# Patient Record
Sex: Female | Born: 1990 | Race: White | Hispanic: No | Marital: Single | Smoking: Never smoker
Health system: Southern US, Community
[De-identification: ages and names within clinical notes are randomized; demographics above are authoritative.]

## PROBLEM LIST (undated history)

## (undated) DIAGNOSIS — I10 Essential (primary) hypertension: Secondary | ICD-10-CM

## (undated) DIAGNOSIS — E669 Obesity, unspecified: Secondary | ICD-10-CM

## (undated) DIAGNOSIS — D649 Anemia, unspecified: Secondary | ICD-10-CM

## (undated) DIAGNOSIS — Z789 Other specified health status: Secondary | ICD-10-CM

## (undated) DIAGNOSIS — O139 Gestational [pregnancy-induced] hypertension without significant proteinuria, unspecified trimester: Secondary | ICD-10-CM

## (undated) DIAGNOSIS — R112 Nausea with vomiting, unspecified: Secondary | ICD-10-CM

## (undated) DIAGNOSIS — O09299 Supervision of pregnancy with other poor reproductive or obstetric history, unspecified trimester: Secondary | ICD-10-CM

## (undated) DIAGNOSIS — F32A Depression, unspecified: Secondary | ICD-10-CM

## (undated) DIAGNOSIS — Z9889 Other specified postprocedural states: Secondary | ICD-10-CM

## (undated) HISTORY — DX: Gestational (pregnancy-induced) hypertension without significant proteinuria, unspecified trimester: O13.9

## (undated) HISTORY — DX: Depression, unspecified: F32.A

## (undated) HISTORY — DX: Anemia, unspecified: D64.9

## (undated) HISTORY — DX: Other specified health status: Z78.9

---
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
## 2008-03-06 ENCOUNTER — Emergency Department (HOSPITAL_COMMUNITY): Admission: EM | Admit: 2008-03-06 | Discharge: 2008-03-06 | Payer: Self-pay | Admitting: Emergency Medicine

## 2008-04-19 ENCOUNTER — Ambulatory Visit: Payer: Self-pay | Admitting: Family Medicine

## 2008-04-19 LAB — CONVERTED CEMR LAB
BUN: 11 mg/dL (ref 6–23)
Basophils Absolute: 0 10*3/uL (ref 0.0–0.1)
Basophils Relative: 0 % (ref 0–1)
CO2: 25 meq/L (ref 19–32)
Chloride: 103 meq/L (ref 96–112)
Creatinine, Ser: 0.65 mg/dL (ref 0.40–1.20)
Eosinophils Relative: 5 % (ref 0–5)
Glucose, Bld: 84 mg/dL (ref 70–99)
Lymphocytes Relative: 28 % (ref 12–46)
Neutrophils Relative %: 58 % (ref 43–77)
Potassium: 3.9 meq/L (ref 3.5–5.3)
RBC: 4.9 M/uL (ref 3.87–5.11)
Sodium: 138 meq/L (ref 135–145)
TSH: 2.555 microintl units/mL (ref 0.350–5.50)
Total Bilirubin: 0.3 mg/dL (ref 0.3–1.2)

## 2008-04-22 ENCOUNTER — Ambulatory Visit (HOSPITAL_COMMUNITY): Admission: RE | Admit: 2008-04-22 | Discharge: 2008-04-22 | Payer: Self-pay | Admitting: Family Medicine

## 2008-06-09 ENCOUNTER — Ambulatory Visit: Payer: Self-pay | Admitting: Internal Medicine

## 2008-07-19 ENCOUNTER — Ambulatory Visit: Payer: Self-pay | Admitting: Internal Medicine

## 2008-07-27 ENCOUNTER — Encounter (INDEPENDENT_AMBULATORY_CARE_PROVIDER_SITE_OTHER): Payer: Self-pay | Admitting: Family Medicine

## 2008-07-27 ENCOUNTER — Ambulatory Visit: Payer: Self-pay | Admitting: Internal Medicine

## 2008-07-27 LAB — CONVERTED CEMR LAB: TSH: 1.469 microintl units/mL (ref 0.350–4.50)

## 2008-10-18 ENCOUNTER — Ambulatory Visit: Payer: Self-pay | Admitting: Internal Medicine

## 2008-12-21 ENCOUNTER — Ambulatory Visit: Payer: Self-pay | Admitting: *Deleted

## 2009-01-11 ENCOUNTER — Inpatient Hospital Stay (HOSPITAL_COMMUNITY): Admission: AD | Admit: 2009-01-11 | Discharge: 2009-01-11 | Payer: Self-pay | Admitting: Obstetrics and Gynecology

## 2009-07-13 IMAGING — US US PELVIS COMPLETE MODIFY
1 series · 14 of 25 positions shown · non-contrast
Comparison: None

CLINICAL DATA: Amenorrhea times 3 months

TRANSABDOMINAL AND TRANSVAGINAL ULTRASOUND OF PELVIS
TECHNIQUE: Both transabdominal and transvaginal ultrasound
examinations of the pelvis were performed including evaluation of
the uterus, ovaries, adnexal regions, and pelvic cul-de-sac.

[Series 1: unknown · 0.32mm/px · 14 of 43 slices shown]
[im 1/43]
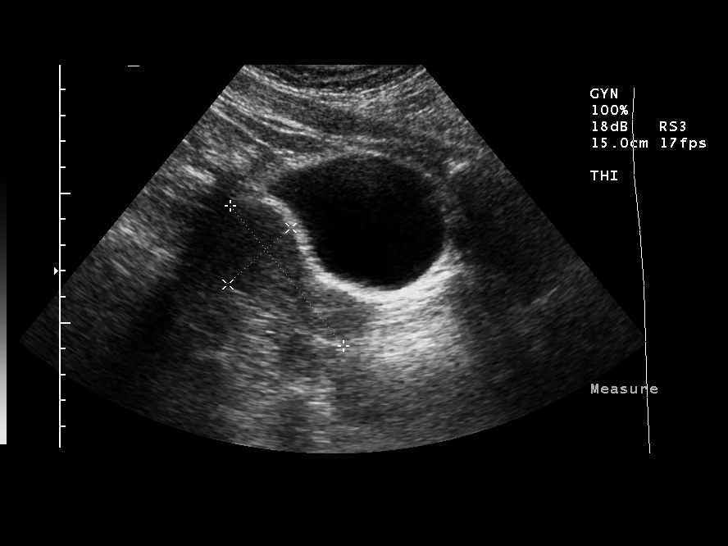
[im 4/43]
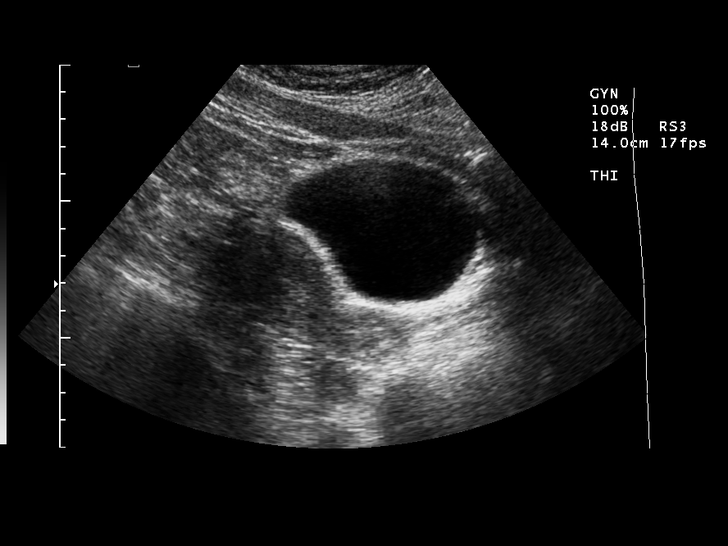
[im 8/43]
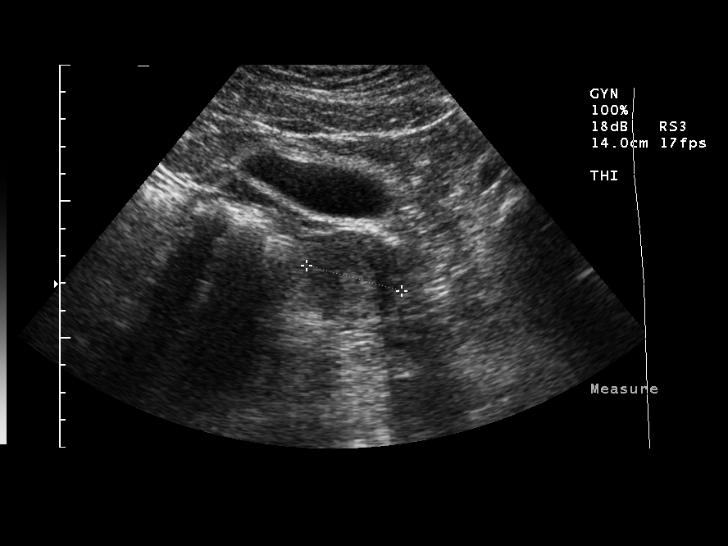
[im 11/43]
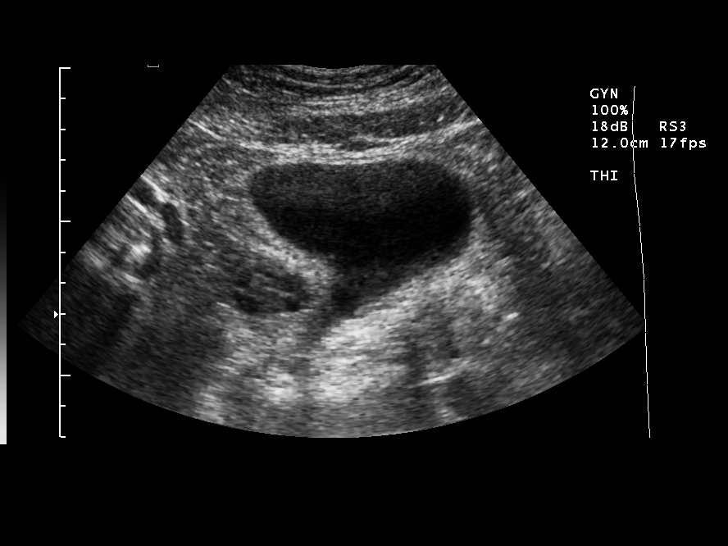
[im 15/43]
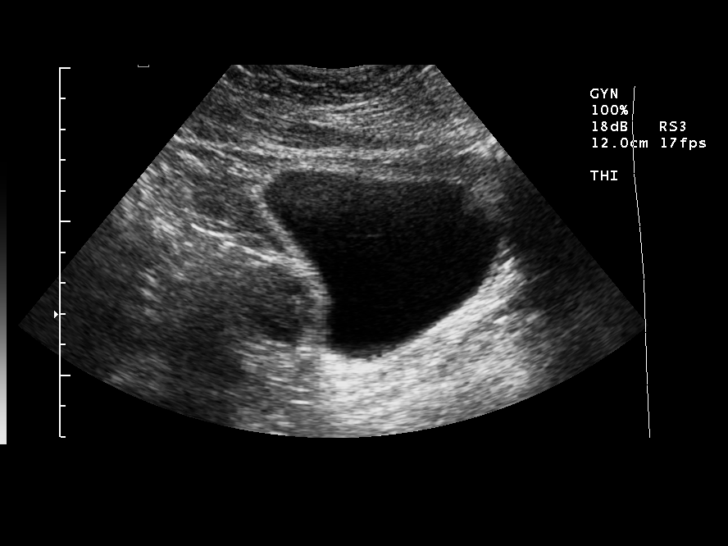
[im 16/43]
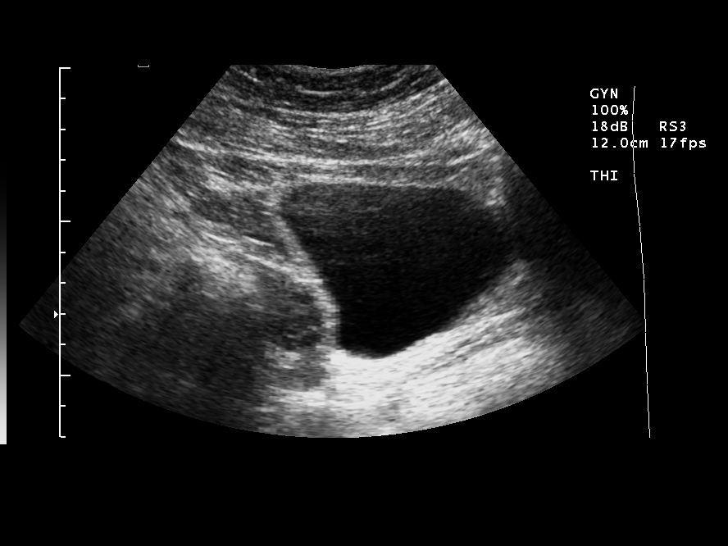
[im 20/43]
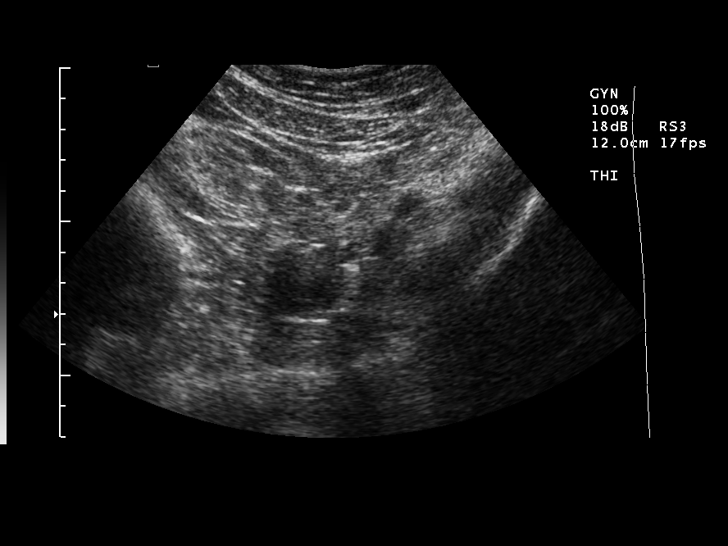
[im 23/43]
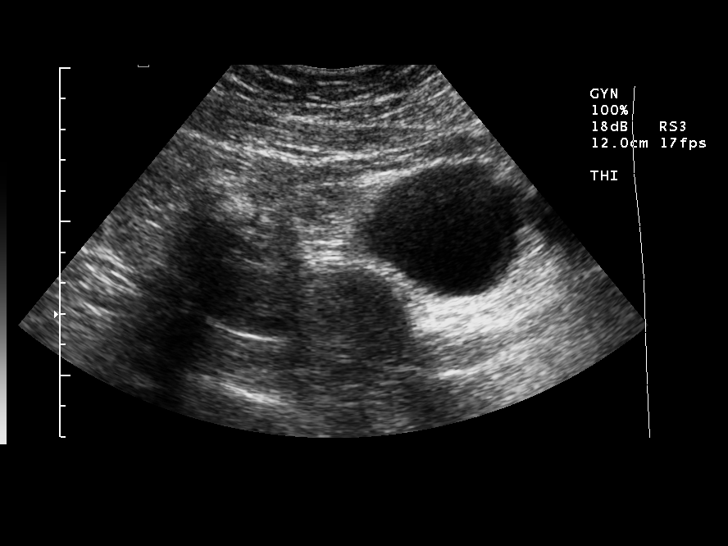
[im 27/43]
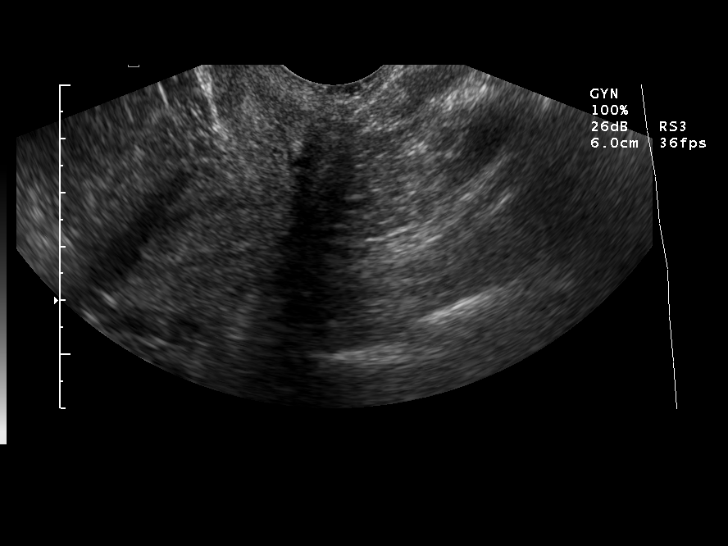
[im 29/43]
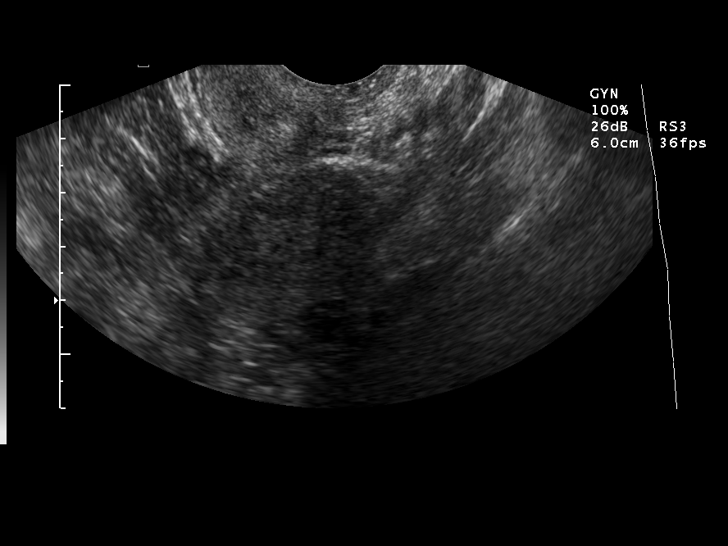
[im 32/43]
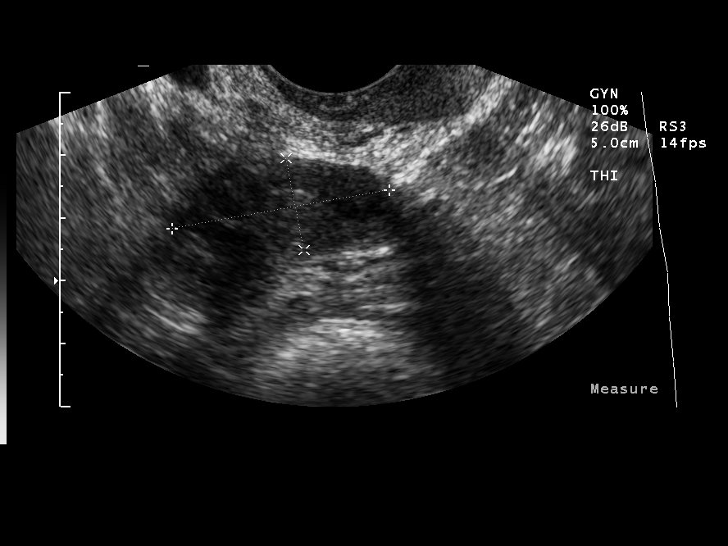
[im 36/43]
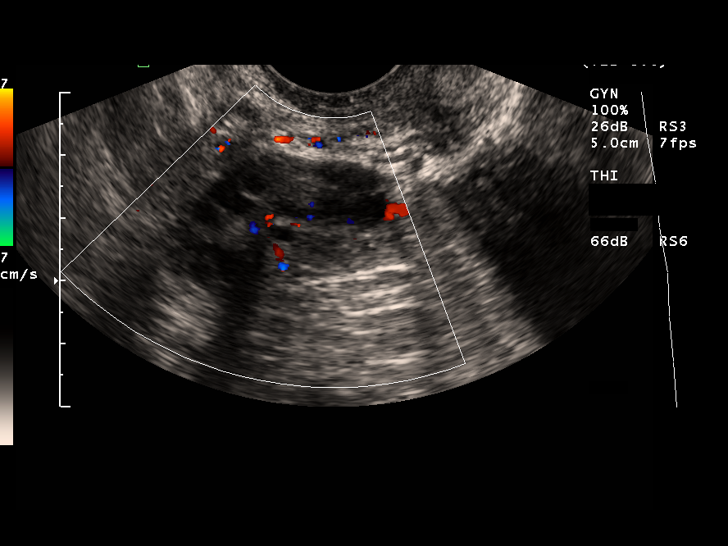
[im 39/43]
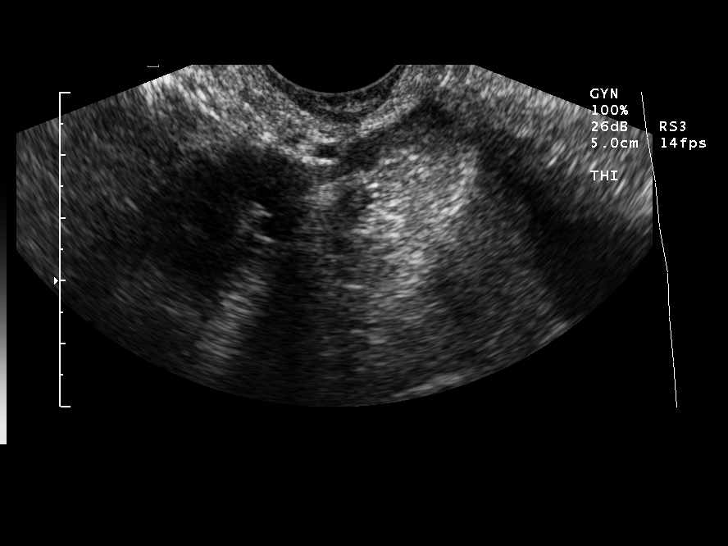
[im 43/43]
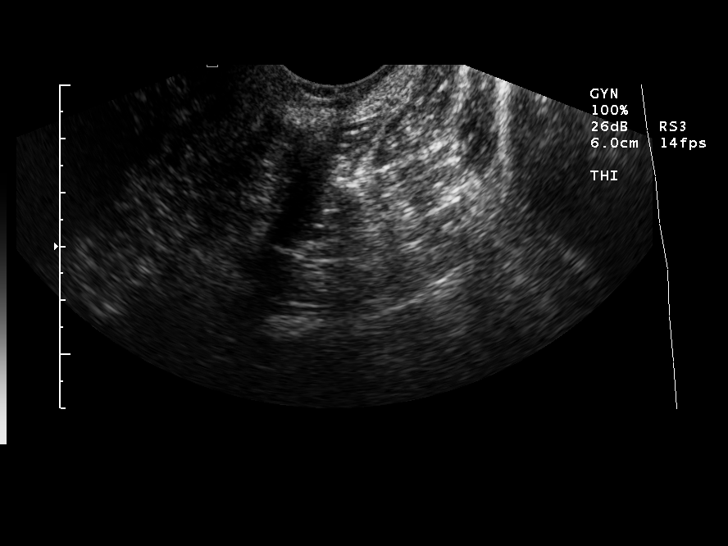

[14 of 25 positions shown; findings below may reference images not displayed]

FINDINGS: The uterus is normal in size and echogenicity measuring
6.9 x 3.3 x 3.6 cm.  The endometrium is uniform in thickness
measuring 11 mm.  No fluid in the endometrial canal.

The right ovary appears normal measuring 2.9 x 2.1 x 2.3 cm with
small follicles.

The left ovary appears normal measuring 3.4 x 2.1 x 2.2 cm of small
follicles.

No evidence of free fluid within the pelvis.
IMPRESSION: 1..  Normal pelvic ultrasound.

## 2010-02-09 ENCOUNTER — Inpatient Hospital Stay (HOSPITAL_COMMUNITY): Admission: AD | Admit: 2010-02-09 | Discharge: 2010-02-10 | Payer: Self-pay | Admitting: Obstetrics & Gynecology

## 2010-03-15 ENCOUNTER — Ambulatory Visit: Payer: Self-pay | Admitting: Obstetrics & Gynecology

## 2010-03-15 ENCOUNTER — Encounter: Payer: Self-pay | Admitting: Obstetrics and Gynecology

## 2010-03-15 LAB — CONVERTED CEMR LAB
Hgb A1c MFr Bld: 5.6 % (ref <5.7)
TSH: 3.38 microintl units/mL (ref 0.350–4.500)

## 2010-03-20 ENCOUNTER — Ambulatory Visit (HOSPITAL_COMMUNITY): Admission: RE | Admit: 2010-03-20 | Discharge: 2010-03-20 | Payer: Self-pay | Admitting: Obstetrics & Gynecology

## 2010-04-04 ENCOUNTER — Ambulatory Visit: Payer: Self-pay | Admitting: Obstetrics and Gynecology

## 2010-04-04 LAB — CONVERTED CEMR LAB
Testosterone Free: 7.2 pg/mL — ABNORMAL HIGH (ref 0.6–6.8)
Testosterone: 32.44 ng/dL (ref 10–70)

## 2010-04-20 ENCOUNTER — Ambulatory Visit: Payer: Self-pay | Admitting: Obstetrics & Gynecology

## 2010-05-14 ENCOUNTER — Ambulatory Visit: Payer: Self-pay | Admitting: Obstetrics & Gynecology

## 2010-07-04 ENCOUNTER — Ambulatory Visit: Payer: Self-pay | Admitting: Obstetrics and Gynecology

## 2011-01-15 LAB — WET PREP, GENITAL: Trich, Wet Prep: NONE SEEN

## 2011-01-15 LAB — HERPES SIMPLEX VIRUS CULTURE: Culture: NOT DETECTED

## 2011-01-15 LAB — URINE MICROSCOPIC-ADD ON

## 2011-01-15 LAB — URINALYSIS, ROUTINE W REFLEX MICROSCOPIC
Bilirubin Urine: NEGATIVE
Nitrite: NEGATIVE
Protein, ur: 30 mg/dL — AB
Urobilinogen, UA: 0.2 mg/dL (ref 0.0–1.0)

## 2011-02-07 LAB — WET PREP, GENITAL
Clue Cells Wet Prep HPF POC: NONE SEEN
Trich, Wet Prep: NONE SEEN

## 2011-02-07 LAB — CBC
MCV: 75.1 fL — ABNORMAL LOW (ref 78.0–100.0)
Platelets: 408 10*3/uL — ABNORMAL HIGH (ref 150–400)
RDW: 15.3 % (ref 11.5–15.5)

## 2011-02-07 LAB — POCT PREGNANCY, URINE: Preg Test, Ur: NEGATIVE

## 2011-02-07 LAB — GC/CHLAMYDIA PROBE AMP, GENITAL: GC Probe Amp, Genital: NEGATIVE

## 2011-06-10 IMAGING — US US TRANSVAGINAL NON-OB
1 series · 14 of 25 positions shown · non-contrast
Comparison: April 22, 2008

CLINICAL DATA: Oligomenorrhea; PCL S

TRANSABDOMINAL AND TRANSVAGINAL ULTRASOUND OF PELVIS
TECHNIQUE: Both transabdominal and transvaginal ultrasound
examinations of the pelvis were performed including evaluation of
the uterus, ovaries, adnexal regions, and pelvic cul-de-sac.

[Series 1: us transvaginal non-ob · 0.25mm/px · 14 of 57 slices shown]
[im 1/57]
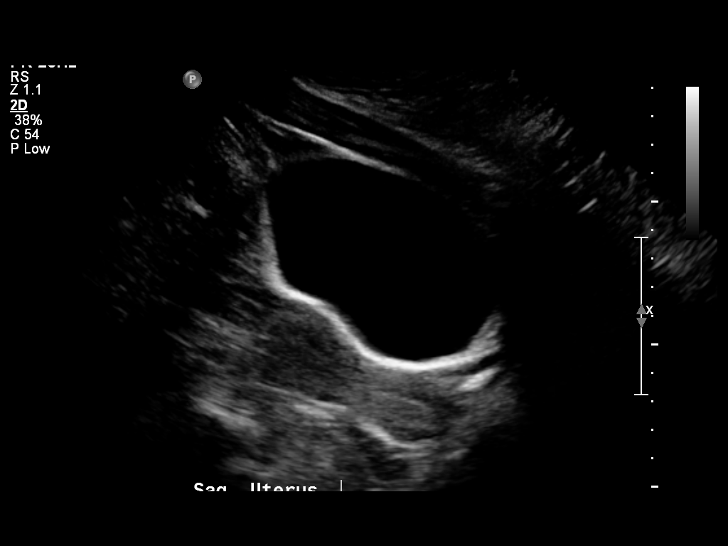
[im 5/57]
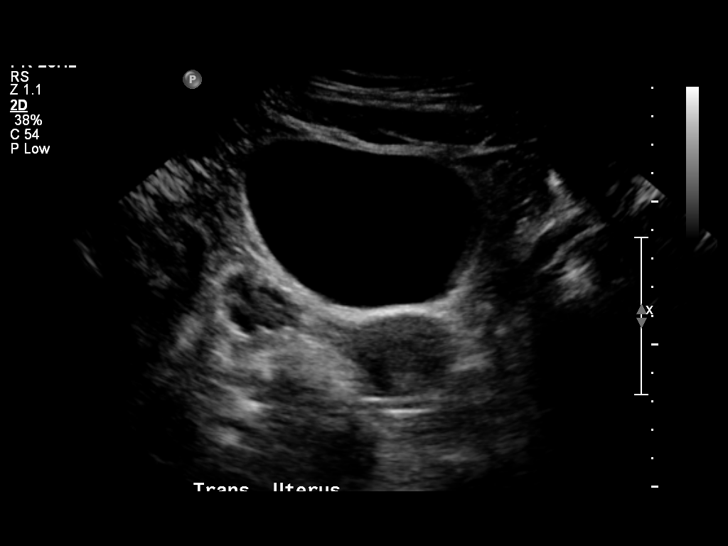
[im 10/57]
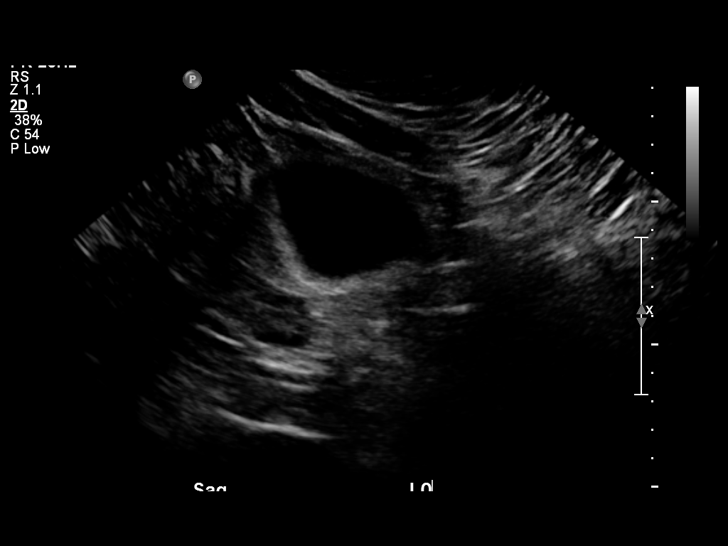
[im 15/57]
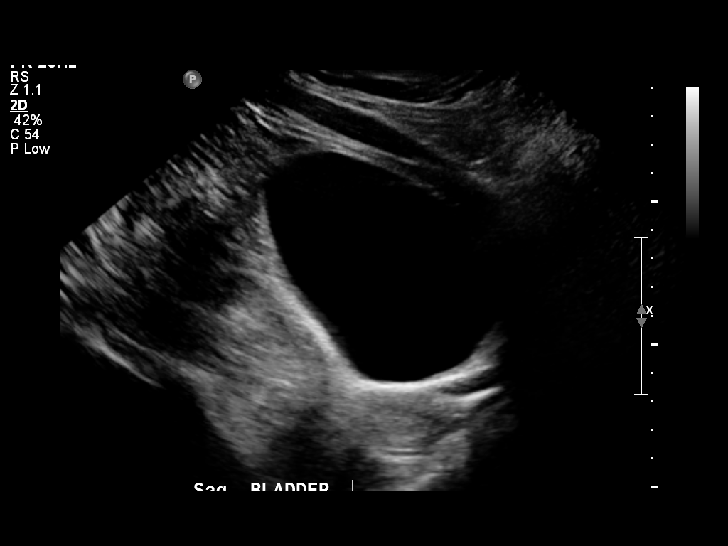
[im 19/57]
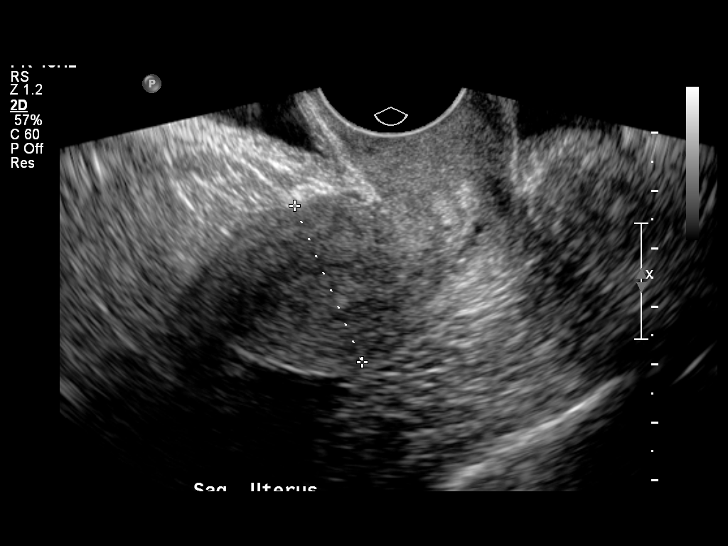
[im 22/57]
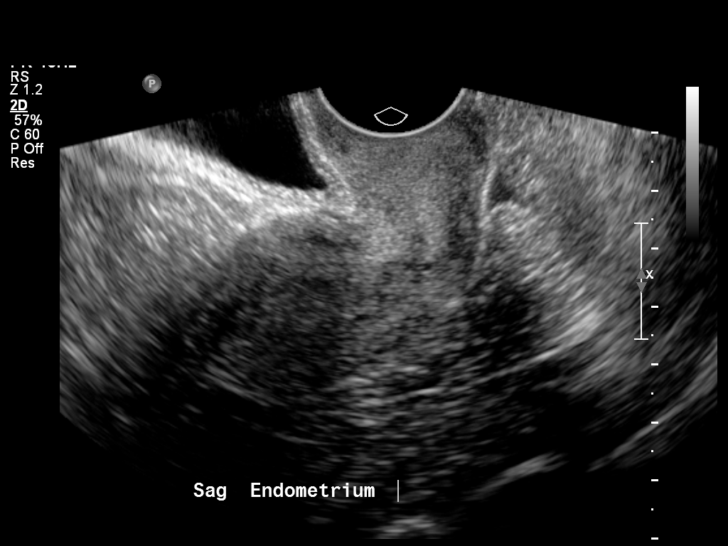
[im 26/57]
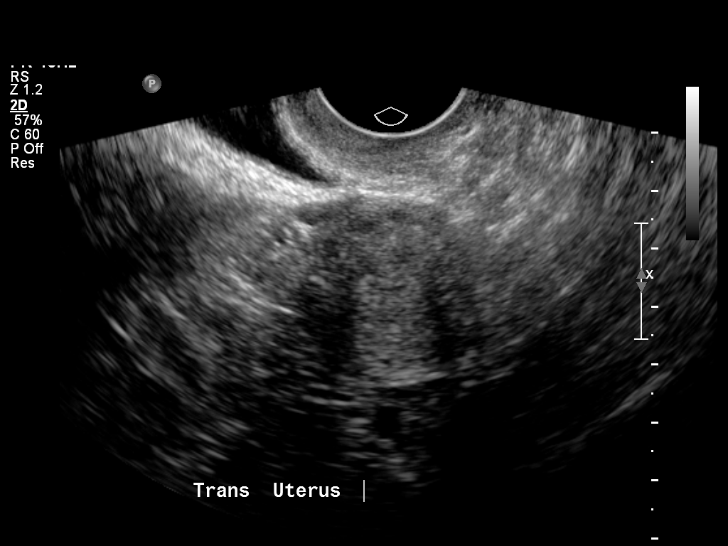
[im 31/57]
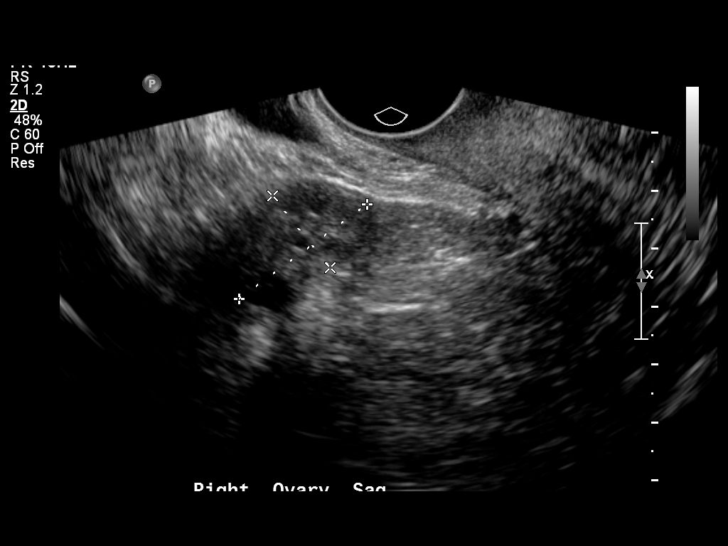
[im 36/57]
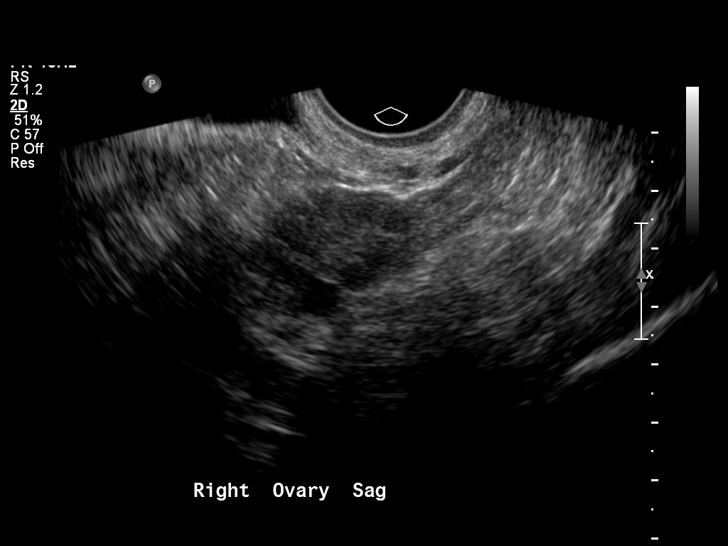
[im 38/57]
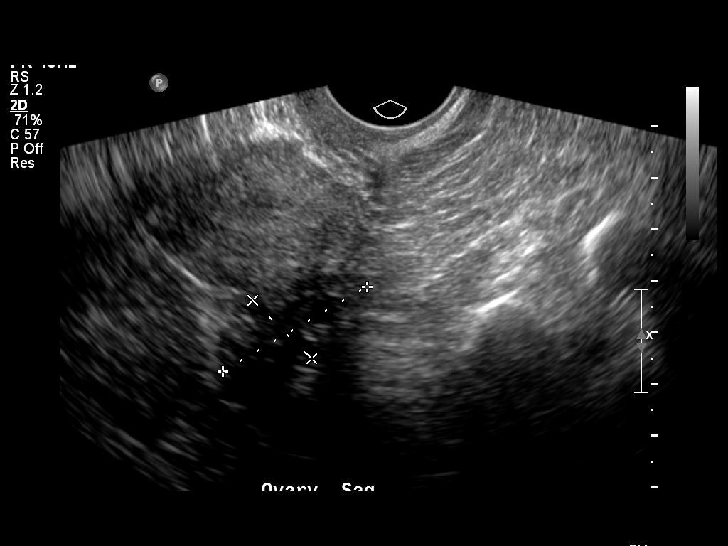
[im 43/57]
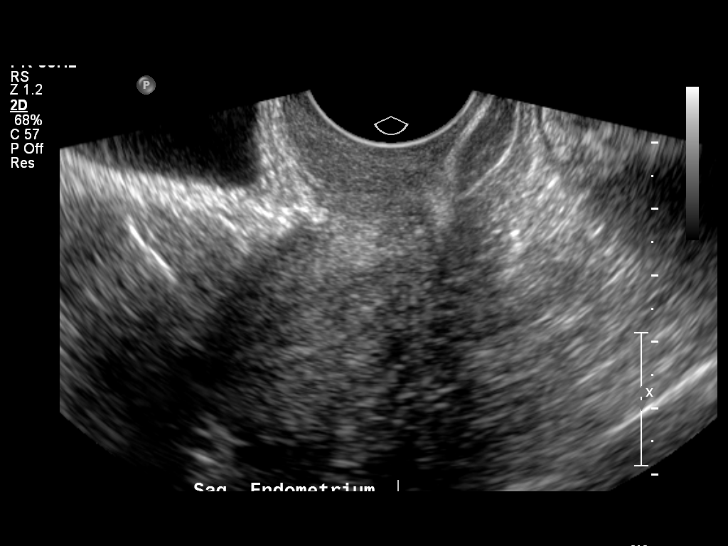
[im 47/57]
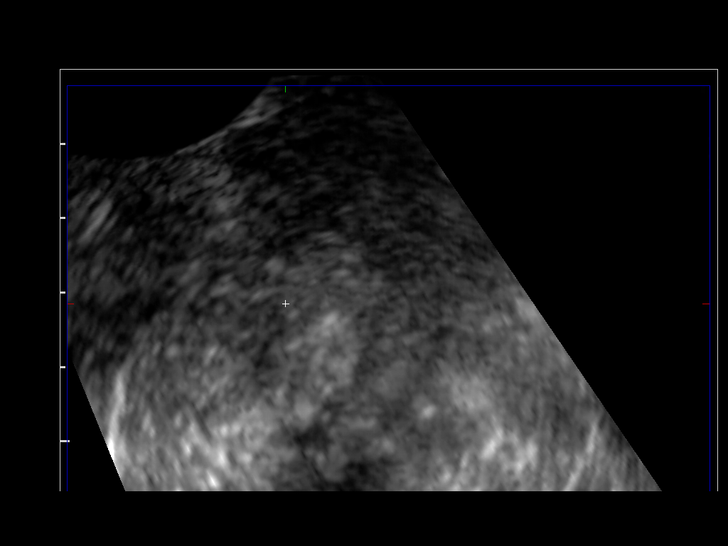
[im 52/57]
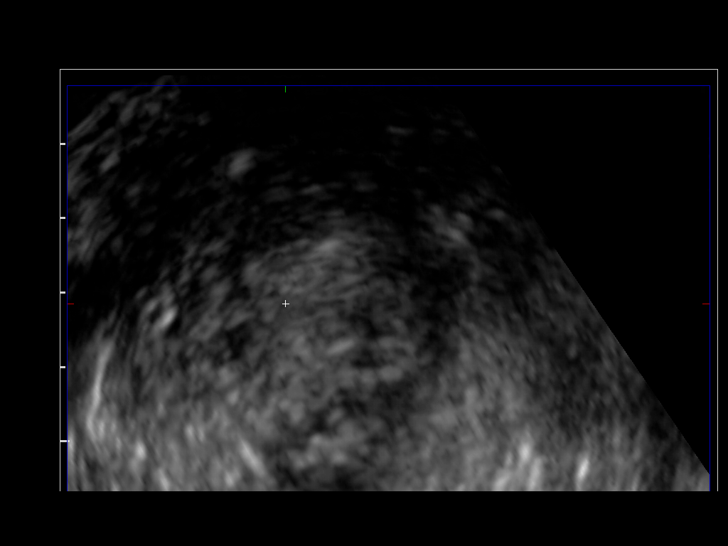
[im 57/57]
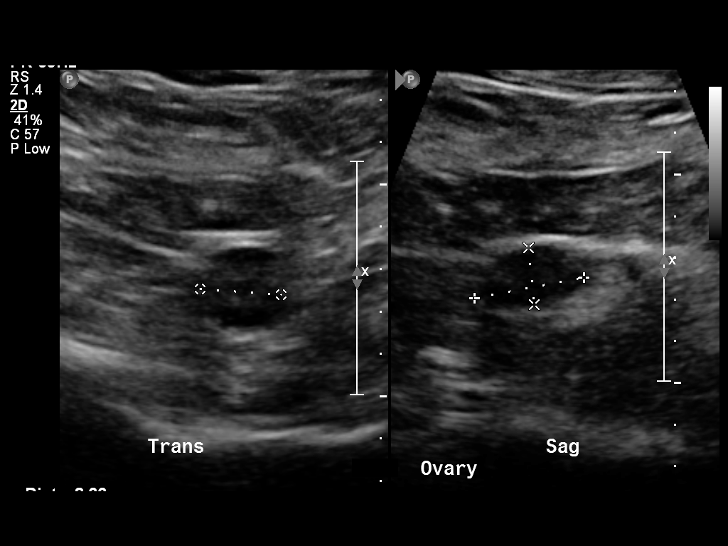

[14 of 25 positions shown; findings below may reference images not displayed]

FINDINGS: The uterus is normal in size and echotexture.  The
uterine dimensions are 5.8 x 2.9 x 3.7 cm.  The endometrial stripe
is homogeneous and within normal limits in width, measuring 6 mm.

Both ovaries have a normal size and appearance.  The right ovary
measures 3.2 x 1.7 x 2.1 cm, and the left ovary measures 3.2 x
x 1.9 cm.  There are no adnexal masses or free pelvic fluid.
IMPRESSION: Normal pelvic ultrasound.

## 2011-10-05 ENCOUNTER — Emergency Department
Admission: EM | Admit: 2011-10-05 | Disposition: A | Discharge status: Home / Self Care | Payer: Self-pay | Source: Ambulatory Visit

## 2013-08-24 ENCOUNTER — Emergency Department
Admission: EM | Admit: 2013-08-24 | Discharge: 2013-08-24 | Disposition: A | Discharge status: Home / Self Care | Payer: Self-pay | Attending: Emergency Medicine | Admitting: Emergency Medicine

## 2013-08-24 ENCOUNTER — Emergency Department: Payer: Self-pay

## 2013-08-24 DIAGNOSIS — M25579 Pain in unspecified ankle and joints of unspecified foot: Secondary | ICD-10-CM | POA: Insufficient documentation

## 2013-08-24 MED ORDER — IBUPROFEN 600 MG PO TABS
600.0000 mg | ORAL_TABLET | Freq: Four times a day (QID) | ORAL | Status: DC | PRN
Start: 2013-08-24 — End: 2014-10-20

## 2013-08-24 NOTE — ED Notes (Signed)
Pt refused crutches.

## 2013-08-24 NOTE — Discharge Instructions (Signed)
Sprain, Ankle,With X-Ray  A sprain is an injury to the ligaments or capsule that holds a joint together. There are no broken bones. Most sprains take from four to six weeks to heal. If the ligament is completely torn (severe sprain), it can take several months to recover.  Mild to moderate sprains may be treated with an elastic wrap or an in-shoe splint to provide support and prevent re-injury. A mild sprain may not require any additional support. A severe sprain may require surgery to repair.  Home care  The following guidelines will help you care for your injury at home:  1. Stay off the injured leg as much as possible until you can walk on it without pain. If you have a lot of pain with walking, crutches or a walker may be prescribed. (These can be rented or purchased at many pharmacies and surgical or orthopedic supply stores). Follow your doctor's advice regarding when to begin bearing weight on that leg.  2. Keep your leg elevated to reduce pain and swelling. When sleeping, place a pillow under the injured leg. When sitting, support the injured leg so it is level with your waist. This is very important during the first 48 hours.  3. Apply an ice pack (ice cubes in a plastic bag, wrapped in a towel) over the injured area for 20 minutes every 1-2 hours the first day. You can place the ice pack directly over the splint/cast. If you were given a boot, open it to apply the ice pack. Continue with ice packs 3-4 times a day for the next two days, then as needed for the relief of pain and swelling.  4. You may use acetaminophen or ibuprofen to control pain, unless another pain medicine was prescribed. If you have chronic liver or kidney disease or ever had a stomach ulcer or GI bleeding, talk with your doctor before using these medicines.  5. You may return to sports after healing, when you can run without pain.  6. A sprained ankle is at risk for re-injury during the first six weeks. During that time, protect your  ankle with an in-shoe splint that prevents tilting of your ankle from side to side. This is very important if you do active work or play sports during that time.  Follow-up care  Any X-rays you had today don't show any broken bones, breaks, or fractures. Sometimes fractures don't show up on the first X-ray. Bruises and sprains can sometimes hurt as much as a fracture. These injuries can take time to heal completely. If your symptoms don't improve or they get worse, talk with your doctor. You may need a repeat X-ray.  When to seek medical care  Get prompt medical attention if any of the following occur:   The plaster cast or splint gets wet or soft   The fiberglass cast or splint gets wet and does not dry for 24 hours   Pain or swelling increases, or redness appears   Toes become cold, blue, numb or tingly   Re-injure your ankle   2000-2014 [employer], [address], [place] [address]. All rights reserved. This information is not intended as a substitute for professional medical care. Always follow your healthcare professional's instructions.    Thank you for choosing [facility] for your emergency care needs. We strive to provide EXCELLENT care to you and your family.      YOUR ACCURATE CONTACT INFORMATION IS VERY IMPORTANT    Before leaving please   check with registration to make sure we have an up-to-date contact number. A Toll-free post discharge customer service number is available to update your registration/insurance information as well as answer any billing questions or concerns. That number is [phone].       IF YOU DO NOT CONTINUE TO IMPROVE OR YOUR CONDITION WORSENS, PLEASE CONTACT YOUR DOCTOR OR RETURN IMMEDIATELY TO THE EMERGENCEY DEPARTMENT.      EXTRA AVAILABLE RESOURCES:    1. DOCTOR REFERRALS  a. Call  our Physician Referral Line @ [phone] If you need  further assistance with referrals to primary care or specialty services our Case Manager may assist at  [phone].  2. FREE HEALTH SERVICES  a. www.freemedicalsearch.org  b. http://www.[facility].org  May be utilized if you need help with health or social services, please call 2-1-1 for a free referral to resources in your area. 2-1-1 is a free service connecting people with information on health insurance, free clinics, pregnancy, mental health, dental care, food assistance, housing, and substance abuse counseling.  3. MEDICAL RECORDS AND TESTS  Certain laboratory test results do not come back the same day, for example: urine cultures may take 3 days. We will attempt to contact you if other important findings are noted. Some lab testing may take 2-5 days. Radiology films are reviewed again to ensure accuracy. If there is any discrepancy, we will notify you. If you have questions or concerns, our Case Manager is available Monday thru Friday, 7:30a-3:00p, for follow up or test result questions. Please call [phone].  4. ED PATIENT [employer] SERVICES: [phone]. Contact for general questions and concerns, daily 11:00a-11:00p.      DISCHARGE MESSAGE:     YOU ARE THE MOST IMPORTANT FACTOR IN YOUR RECOVERY. Follow   the above instructions carefully. Take your medicines as prescribed. Most   important, see your  doctor in follow-up  as recommended by your ED physician.   Call your doctor if your condition worsens or if you have any new, worsening, or   severe symptoms. If you need further care and cannot be seen by your    recommended follow-up doctor, the Emergency Department Case Manager will   be available to help as needed [phone].  If you require immediate    assistance, return to the Emergency Department or call 911.    Pharmacy information  For your convenience please visit [facility] for your prescription needs. [facility] has extended evening and weekend hours, including holidays, to ensure patients can begin taking medications as soon as possible.  Most insurance plans are  accepted.    Pharmacy Hours:  Monday - Friday: 8:30a to 8:30p  Saturday: 9a to 1p  Sunday: 9a to 1p and 6p to 9p  Holidays: 9a to 1p    Pharmacy Phone: [phone]      [facility] has been providing home care solutions for independent living since 1984. Servicing [place] and [place]. [facility] is a full service home medical provider of home oxygen and respiratory care, medical equipment and supplies. [phone].    Thanks Again, for allowing [facility]   Emergency Department to serve you.

## 2013-08-24 NOTE — ED Notes (Signed)
Pt c/o pain in left ankle that seems chronic in nature- pt states she injured it last year and now has been dealing with pain for a couple of weeks, OTC treatments not helping. No obvious swelling/discoloration.

## 2013-08-24 NOTE — ED Provider Notes (Signed)
Physician/Midlevel provider first contact with patient: 08/24/13 1625         [facility]  EMERGENCY DEPARTMENT  History and Physical Exam       Patient Name: Kristi Branch, Kristi Branch  Encounter Date:  08/24/2013  Attending Physician: Donalynn Furlong, MD  Treating Provider: Rachel Bo, PA  PCP: Christa See, MD  Patient DOB:  19-Aug-1991  MRN:  [id number]  Room:  E55/E55-A      History of Presenting Illness     Chief complaint: Ankle Pain    HPI/ROS is limited by: none  HPI/ROS given by: patient    Location: left lateral ankle  Duration: 10 days  Severity: moderate    Kristi Branch is a 22 y.o. female who presents with left ankle pain and swelling for the past 10 days.  She denies injury or trauma.  She states she had an injury 1 year ago of the same ankle, but did not f/u with ortho.  She stands for 12 hours daily at work.  She states the left ankle is swollen after work.  She denies swelling or pain in the right ankle.  She denies calf pain or tenderness.  She denies redness, fever, chills, or skin abrasion.         Review of Systems     Review of Systems   Constitutional: Negative for fever and chills.   Respiratory: Negative for cough and shortness of breath.    Cardiovascular: Negative for chest pain, palpitations and leg swelling.   Gastrointestinal: Negative for nausea and vomiting.   Genitourinary: Negative for urgency.   Musculoskeletal: Positive for gait problem and joint swelling. Negative for arthralgias and myalgias.        Pain with weight bearing.     Skin: Negative for color change, rash and wound.   Neurological: Negative for weakness and numbness.   Hematological: Negative for adenopathy.          Allergies & Medications     Pt  has no known allergies.    Current/Home Medications    No medications on file         Past Medical, Surgical, Family and Social History     Pt has no past medical history on file.    Pt has no past surgical history on file.    The family  history is not on file.    Pt reports that she has never smoked. She does not have any smokeless tobacco history on file. She reports that she drinks alcohol. She reports that she does not use illicit drugs.    In addition to the above history, please see nursing notes.  Allergies, meds, past medical, family and social hx have been REVIEWED BY MYSELF.        Physical Exam     Blood pressure 134/81, pulse 89, temperature 97.3 F (36.3 C), temperature source Oral, resp. rate 18, height 1.549 m, weight 111.5 kg, SpO2 100.00%.    Physical Exam   Nursing note and vitals reviewed.  Constitutional: She is oriented to person, place, and time. She appears well-developed and well-nourished. No distress.   HENT:   Head: Normocephalic and atraumatic.   Right Ear: External ear normal.   Left Ear: External ear normal.   Nose: Nose normal.   Mouth/Throat: Oropharynx is clear and moist.   Eyes: Conjunctivae normal are normal. Right eye exhibits no discharge. Left eye exhibits no discharge.   Neck: Normal range of motion. Neck supple.  Cardiovascular: Normal rate, regular rhythm, normal heart sounds and intact distal pulses.  Exam reveals no gallop and no friction rub.    No murmur heard.  Pulses:       Dorsalis pedis pulses are 2+ on the right side, and 2+ on the left side.        Posterior tibial pulses are 2+ on the right side, and 2+ on the left side.   Pulmonary/Chest: Effort normal and breath sounds normal. No respiratory distress. She has no decreased breath sounds. She has no wheezes. She has no rhonchi. She has no rales. She exhibits no tenderness.   Musculoskeletal: Normal range of motion.        Left knee: Normal.        Left ankle: She exhibits normal range of motion, no swelling, no ecchymosis, no deformity, no laceration and normal pulse. tenderness. Lateral malleolus tenderness found. No medial malleolus, no AITFL, no CF ligament, no posterior TFL, no head of 5th metatarsal and no proximal fibula tenderness found.  Achilles tendon normal.        Left lower leg: Normal.        Left foot: Normal.        There is no ecchymosis or appreciable soft tissue swelling about the left foot/ankle.  Pt ambulated normally into the ED examination room.      Neurological: She is alert and oriented to person, place, and time. Coordination normal.   Skin: Skin is warm, dry and intact. No rash noted. No erythema.   Psychiatric: She has a normal mood and affect.            Diagnostic Results     The results of the diagnostic studies below have been reviewed by myself:    Labs  Results     ** No Results found for the last 24 hours. **          Radiologic Studies  Xr Ankle Left 3+ Views    08/24/2013  No acute fracture nor destructive osseous lesions present. Soft tissue swelling about the ankle noted. Joint spaces well-maintained. Ankle mortise is intact.  ReadingStation:[id number]       EKG: none       Medical Decision Making     Blood pressure 134/81, pulse 89, temperature 97.3 F (36.3 C), temperature source Oral, resp. rate 18, height 1.549 m, weight 111.5 kg, SpO2 100.00%.      Orders Placed This Encounter   Procedures   . XR Ankle Left 3+ Views   . Education: Walking With Ambulatory Aid   . Apply Air Cast       The results of the diagnostic studies, performed during the timeframe I've seen and evaluated the patient, have been REVIEWED BY MYSELF.      The differential diagnosis includes, but is not limited to bursitis, tenosynovitis, myofascial strain, sprain, fracture, cellulitis, degenerative arthritis, gout, septic joint, pedal edema, tendinitis, plantar fasciitis, vascular occlusion, DVT, lumbar radiculopathy, sciatica, and Baker's cyst.      Labs / imaging were reviewed and explained to the patient. All questions have been answered. Pt is appreciative of care.        Procedures / Critical Care     none     Diagnosis / Disposition     Clinical Impression  1. Ankle pain, left        Disposition  ED Disposition     Discharge Kristi Branch discharge to home/self care.    Condition  at disposition: Stable              Follow up for Discharged Patients  Lucretia Kern Richrd Prime, MD  [address]  [place] [address]  [phone]    Schedule an appointment as soon as possible for a visit in 1 week      [facility] Emergency Department  [address]  [place] [address]  [phone]    As needed if symptoms worsen      Prescriptions for Discharged Patients  New Prescriptions    IBUPROFEN (ADVIL,MOTRIN) 600 MG TABLET    Take 1 tablet (600 mg total) by mouth every 6 (six) hours as needed for Pain.          This is note has been created by an Electronic Medical Record that may contain additions or subtractions not intended by myself, Albertine Patricia, PA-C.      Rachel Bo, [place]  08/24/13 [phone]

## 2014-08-23 ENCOUNTER — Emergency Department
Admission: EM | Admit: 2014-08-23 | Discharge: 2014-08-23 | Disposition: A | Discharge status: Home / Self Care | Payer: Self-pay | Attending: Emergency Medicine | Admitting: Emergency Medicine

## 2014-08-23 ENCOUNTER — Emergency Department: Payer: Self-pay

## 2014-08-23 DIAGNOSIS — R21 Rash and other nonspecific skin eruption: Secondary | ICD-10-CM | POA: Insufficient documentation

## 2014-08-23 MED ORDER — DIPHENHYDRAMINE HCL 25 MG PO CAPS
50.0000 mg | ORAL_CAPSULE | Freq: Once | ORAL | Status: AC
Start: 2014-08-23 — End: 2014-08-23
  Administered 2014-08-23: 50 mg via ORAL

## 2014-08-23 MED ORDER — DIPHENHYDRAMINE HCL 25 MG PO CAPS
ORAL_CAPSULE | ORAL | Status: AC
Start: 2014-08-23 — End: ?
  Filled 2014-08-23: qty 2

## 2014-08-23 NOTE — Discharge Instructions (Signed)
Self-Care for Skin Rashes  When your skin reacts to a substance your body is sensitive to, it can cause a rash. You can treat most rashes at home by keeping the skin clean and dry. Many rashes may resolve on their own within 2 to 3 days. You may need medical attention if your rash itches, drains, or hurts, particularly if the rash is getting worse.  What can cause a skin rash?   Sun poisoning, caused by too much exposure to the sun   An irritant or allergic reaction to a certain type of food, plant, or chemical, such as shellfish, poison ivy and or cleaning products   An infection caused by a fungus (ringworm), virus (chickenpox), or bacteria (strep)   Bites or infestation due to insects or pests, such as ticks, lice, or mites   Dry skin, which is often seen during the winter months and in older people  How can I control itching and skin damage?   Take soothing lukewarm baths in a colloidal oatmeal preparation, which you can buy at the drugstore.   Do your best not to scratch.Clipfingernails short, especially in young children, to reduce skin damageif scratching does occur.   Use moisturizing skin lotion instead of scratching your dry skin.   Use sunscreen whenever going out into direct sun.   Use only mild cleansing agents whenever possible   Wash with mild, nonirritating soap and warm water.   Wear clothing that breathes, such as cotton shirts or canvas shoes.   If fluid is seeping from the rash, cover it loosely with clean gauze to absorb the discharge.   Many rashes are contagious. Prevent the rash from spreading to others by washing your hands frequently before or after touching others with any skin rash.  Use medication   Antihistamines, such as diphenhydramine, can help control itching, however use with caution as they can make you drowsy.   Using over-the-counterhydrocortisone cream on small rashes may helpreduce swelling and itching   Most over-the-counterantifungal medicinescan  treat athlete's foot and many other fungal infections of the skin.  Check with your pharmacist  Call your pharmacist if:   You were told that you have a fungal infection on your skin to make sure you have the correct type of medicine   You have questionsor concerns about medicines or theirside effects.       2000-2015 [employer] [address], [place] [address]. All rights reserved. This information is not intended as a substitute for professional medical care. Always follow your healthcare professional's instructions.

## 2014-08-23 NOTE — ED Provider Notes (Addendum)
Physician/Midlevel provider first contact with patient: 08/23/14 1611         History     Chief Complaint   Patient presents with   . Rash     HPI Comments: Pt is a 23 y/o female who present with pruritic rash on her face, chest and abdomen that appeared this morning. Endorses a subjective fever and chills since Saturday. She states her boyfriend had the chicken pox 3 weeks ago.     Patient is a 23 y.o. female presenting with rash. The history is provided by the patient.   Rash  Associated symptoms: fever    Associated symptoms: no nausea, no shortness of breath, no sore throat and not vomiting             No past medical history on file.    No past surgical history on file.    No family history on file.    Social  History   Substance Use Topics   . Smoking status: Never Smoker    . Smokeless tobacco: Not on file   . Alcohol Use: Yes       .     No Known Allergies    Current/Home Medications    IBUPROFEN (ADVIL,MOTRIN) 600 MG TABLET    Take 1 tablet (600 mg total) by mouth every 6 (six) hours as needed for Pain.        Review of Systems   Constitutional: Positive for fever and diaphoresis. Negative for chills.   HENT: Negative for rhinorrhea, sore throat and trouble swallowing.         No coryza.   Respiratory: Negative for cough and shortness of breath.    Gastrointestinal: Negative for nausea and vomiting.   Genitourinary: Negative for menstrual problem.        LMP October 20.   Skin: Positive for rash. Negative for color change.        PRURITIC RASH ON TRUNK, EXTREMITIES AND FACE.   Hematological: Negative for adenopathy.       Physical Exam    BP: 135/67 mmHg, Heart Rate: 90, Temp: 98.6 F (37 C), Resp Rate: 18, SpO2: 100 %, Weight: 115.8 kg    Physical Exam   Constitutional: She is oriented to person, place, and time. She appears well-developed and well-nourished. She appears distressed.   Mildly worried 23 yr old white female over history of exposure to boyfriend with "chicken pox" 3 weeks ago.   HENT:    Head: Normocephalic.   Right Ear: External ear normal.   Left Ear: External ear normal.   Nose: Nose normal.   Mouth/Throat: Oropharynx is clear and moist. No oropharyngeal exudate.   Eyes: Conjunctivae and EOM are normal. Pupils are equal, round, and reactive to light.   Neck: Normal range of motion. Neck supple.   Cardiovascular: Normal rate and regular rhythm.    No murmur heard.  Pulmonary/Chest: Effort normal and breath sounds normal. No respiratory distress. She has no wheezes.   Musculoskeletal: Normal range of motion. She exhibits no edema.   Lymphadenopathy:     She has no cervical adenopathy.   Neurological: She is alert and oriented to person, place, and time.   Skin: Skin is warm and dry. Rash noted. She is not diaphoretic.   Diffuse maculo-papular rash on trunk, and upper extremities sparing palms and some on face without current appearance of chicken pox and no coryza, or conjunctivitis.   Psychiatric: She has a normal mood and affect. Her  behavior is normal. Judgment and thought content normal.   Nursing note and vitals reviewed.      MDM and ED Course     ED Medication Orders     Start     Status Ordering Provider    08/23/14 1616  diphenhydrAMINE (BENADRYL) capsule 50 mg   Once in ED     Route: Oral  Ordered Dose: 50 mg     Ordered Kenyan Karnes A              MDM: Diagnostic considerations include, but are not limited to: allergic reaction, viral exanthem, viral syndrome.         Procedures    Clinical Impression & Disposition     Clinical Impression  Final diagnoses:   Rash of entire body        ED Disposition     Discharge Eino Farber Patricio discharge to home/self care.    Condition at disposition: Stable             New Prescriptions    No medications on file               I have reviewed all medical history, surgical history, family history, current medications, and known allergies with the patient. In addition, I have discussed the results of all laboratory and imaging  results relevant to the diagnosis with the patient.    All relevant and clinical information was transcribed by me, Pleas Patricia, acting as a scribe for Dr. Eddie Candle.    Valene Bors A, DO  08/23/14 1617    Valene Bors A, DO  08/23/14 1619    Valene Bors A, DO  08/23/14 1620    Valene Bors A, DO  08/23/14 1622    Valene Bors A, DO  08/30/14 2354

## 2014-08-24 ENCOUNTER — Emergency Department: Payer: Self-pay

## 2014-08-24 ENCOUNTER — Emergency Department
Admission: EM | Admit: 2014-08-24 | Discharge: 2014-08-24 | Disposition: A | Discharge status: Home / Self Care | Payer: Self-pay | Attending: Emergency Medicine | Admitting: Emergency Medicine

## 2014-08-24 DIAGNOSIS — B019 Varicella without complication: Secondary | ICD-10-CM | POA: Insufficient documentation

## 2014-08-24 MED ORDER — HYDROXYZINE HCL 50 MG PO TABS
50.0000 mg | ORAL_TABLET | Freq: Three times a day (TID) | ORAL | Status: DC | PRN
Start: 2014-08-24 — End: 2014-10-20

## 2014-08-24 NOTE — Discharge Instructions (Signed)
Chickenpox (Adult)  Chickenpox is a very contagious illness caused by a virus. Symptoms include fever and an itchy red rash of small blisters that form all over the body. Blisters may appear on the scalp, face, and in the mouth. Women may also get blisters in the vaginal area. New blisters will appear over the first several days. The contagious period ends when all blisters have crusted over. This is usually about 1 week after the illness begins.  Most children older than 1 year and adults who have never had chickenpox can be vaccinated to prevent this illness.  Home care  Follow these guidelines when caring for yourself at home:   You may use acetaminophen or ibuprofen to control pain and fever, unless another medicine was prescribed. If you have chronic liver or kidney disease, talk with your health care provider before using these medicines. Also talk with your provider if you've had a stomach ulcer or GI bleeding. Don't give aspirin to anyone younger than 25 years old who is ill with a fever. It may cause severe liver damage.   You can relieve itching and pain by mixing cool water with cornstarch, baking soda, commercial oatmeal bath powder. The oatmeal bath powder is available without a prescription. Use this mixture as a compress for the area. This will soothe the skin. Calamine or another anti-itch lotion may help.   Diphenhydramine is an antihistamine available at drugstores and groceries. You can use this medicine to ease itching over large areas of skin unless a prescription antihistamine was given. Use lower doses during the day and higher doses at bedtime. This is because the drug may make you sleepy. Loratidine is another antihistamine that you may use. It causes less drowsiness and is a good choice for daytime use.   Bathe daily. Wash the rash with soap to stop infection.  Follow-up care  Follow up with your health care provider, or as advised, if the above tips don't bring relief.  When to seek  medical advice  Call your health care provider right away if any of these occur:   Signs of skin infection. These include colored drainage from the sores and redness or tenderness of the sores that gets worse.   Excessive vomiting   Severe headache, stiff neck, or confusion   Joint pain, redness, or swelling   Dark urine, or yellow skin or eyes   Leg weakness or difficulty walking   Fever of 100.4F (38C) oral or 101.4F (38.5C) rectal or higher that doesn't get better with fever medicine   2000-2015 [employer] [address], [place] [address]. All rights reserved. This information is not intended as a substitute for professional medical care. Always follow your healthcare professional's instructions.

## 2014-08-24 NOTE — ED Provider Notes (Signed)
[facility]  EMERGENCY DEPARTMENT  History and Physical Exam       Patient Name: Kristi Branch, Kristi Branch  Encounter Date:  08/24/2014  Physician Assistant: Boykin Peek, PA-C  Attending Physician: Cam Hai, DO  PCP: Christa See, MD  Patient DOB:  14-Jun-1991  MRN:  [id number]  Room:  E58/E58-A      History of Presenting Illness     Chief complaint: Rash    HPI/ROS given by: Patient    Kristi Branch is a 23 y.o. female who presents with diffuse rash that has been present for several days. She was exposed to varicella via her boyfriend a couple of weeks ago. She is vaccinated against Marcella. She denies any fevers or URI symptoms. She was seen here last night and prescribed Benadryl but the symptoms have worsened. She has multiple pustular/vesicular lesions on her face, anterior and posterior torso, arms and legs. Nothing on her palms or soles or oral mucosa.       Review of Systems     Review of Systems   Constitutional: Negative for fever, chills and unexpected weight change.   Respiratory: Negative for shortness of breath.    Cardiovascular: Negative for chest pain.   Gastrointestinal: Negative for nausea and vomiting.   Skin: Positive for rash.   Neurological: Negative for headaches.   Hematological: Does not bruise/bleed easily.   Psychiatric/Behavioral: Negative for agitation.   All other systems reviewed and are negative.       Allergies & Medications     Pt has No Known Allergies.    Current/Home Medications    IBUPROFEN (ADVIL,MOTRIN) 600 MG TABLET    Take 1 tablet (600 mg total) by mouth every 6 (six) hours as needed for Pain.        Past Medical History     Pt has no past medical history o     Past Surgical History     Pt  has no past surgical history on file.     Family History     The family history is not on file.     Social History     Pt reports that she has never smoked. She does not have any smokeless tobacco history on file. She reports that  she drinks alcohol. She reports that she does not use illicit drugs.     Physical Exam     Blood pressure 137/91, pulse 107, temperature 98.4 F (36.9 C), temperature source Oral, resp. rate 18, height 1.575 m, weight 114.1 kg, last menstrual period 08/17/2014, SpO2 100 %.    Constitutional: Well developed, well nourished, active, in no apparent distress.  HENT:   Head: Normocephalic, atraumatic  Ears: No external lesions.  Nose: No external lesions. No epistaxis or drainage.  Eyes: PERRL. No scleral icterus. No conjunctival injection. EOMI.  Neck: Trachea is midline. No JVD. Normal range of motion. No apparent masses.  Cardiovascular: Regular rhythm, S1 normal and S2 normal. No murmur heard.  Pulmonary/Chest: Effort normal. Lungs clear to auscultation bilaterally.   Abdominal: Soft, non-tender, non-distended. No masses.   Genitourinay/Anorectal: Defferred  Musculoskeletal: Normal range of motion. No deformity or apparent injury.   Neurological: Pt is alert. Cranial nerves are grossly intact. Moving all extremities without apparent deficit.   Psychiatric: Affect is appropriate. There is no agitation.   Skin: Skin is warm, dry, well perfused. No rash noted. No cyanosis. No pallor. Multiple pustular/vesicular lesions to the face, scalp, upper and lower anterior/posterior torso/abdomen,  arms/legs.        Diagnostic Results     The results of the diagnostic studies below have been reviewed by myself:    Labs  Results     ** No results found for the last 24 hours. **          Radiologic Studies  No results found.       ED Course and Medical Decision Making     ED Medication Orders     None          Differential diagnosis includes varicella, folliculitis, shingles, HSV, viral exanthem.    History and exam consistent with breakthrough varicella ("mild varicella-like syndrome"). At this point the chance of acyclovir changing the natural history of the disease process is very low, therefore I will treat her symptomatically  with Atarax. She understands to return for worsening symptoms especially high fevers that do not respond to Tylenol/Motrin. She is given a work note for 2 weeks. She is advised to stay away from immunosuppressed people, infants, elderly.    In addition to the above history, please see nursing notes. Allergies, meds, past medical, family, social hx, and the results of the diagnostic studies performed have been reviewed by myself.    I discussed this case with Dr Windell Moulding in the emergency department who also directly examined the patient and agrees with the assessment and treatment plan.     This chart was generated by an EMR and may contain errors or omissions not intended by the user.     Procedures / Critical Care     None     Diagnosis / Disposition     Clinical Impression  1. Varicella        Disposition  ED Disposition     Discharge Eino Farber Patricio discharge to home/self care.    Condition at disposition: Stable              Follow up for Discharged Patients  Lamarr Lulas, MD  [address]  [place] [address]  [phone]      To establish primary care      Prescriptions for Discharged Patients  New Prescriptions    HYDROXYZINE (ATARAX) 50 MG TABLET    Take 1-2 tablets (50-100 mg total) by mouth every 8 (eight) hours as needed for Itching.              Boykin Peek, PA  08/24/14 1805    Valene Bors A, DO  09/02/14 2352

## 2014-08-24 NOTE — ED Notes (Signed)
Pt reports she was seen here yesterday for rash on face, reports spreading. Rash to pt face and bilateral upper extremities, stomach, back, and chest.

## 2014-08-24 NOTE — ED Notes (Signed)
Pt reports that she was exposed to boyfriend who had chickenpox approx 3 wks ago. Pt reports she has not had disease but was vaccinated as a child.

## 2014-10-20 ENCOUNTER — Emergency Department
Admission: EM | Admit: 2014-10-20 | Discharge: 2014-10-20 | Disposition: A | Discharge status: Home / Self Care | Payer: Self-pay | Attending: Emergency Medicine | Admitting: Emergency Medicine

## 2014-10-20 ENCOUNTER — Emergency Department: Payer: Self-pay

## 2014-10-20 DIAGNOSIS — B9789 Other viral agents as the cause of diseases classified elsewhere: Secondary | ICD-10-CM | POA: Insufficient documentation

## 2014-10-20 DIAGNOSIS — J038 Acute tonsillitis due to other specified organisms: Secondary | ICD-10-CM | POA: Insufficient documentation

## 2014-10-20 LAB — VH STREP A RAPID TEST: Strep A, Rapid: NEGATIVE

## 2014-10-20 MED ORDER — DEXAMETHASONE 4 MG PO TABS
12.0000 mg | ORAL_TABLET | Freq: Once | ORAL | Status: AC
Start: 2014-10-20 — End: 2014-10-20

## 2014-10-20 NOTE — Discharge Instructions (Signed)
Viral Pharyngitis (Sore Throat)    You (or your child, if your child is the patient) have pharyngitis (sore throat). This infection is caused by a virus. Itcan cause throat pain that is worse when swallowing, aching all over, headache,and fever. The infection may be spread by coughing, kissing,or touching others after touching your mouth or nose. Antibiotic medications do not work against viruses, so they are not used for treating this condition.  Home care   If your symptoms are severe, rest at home. Return to work or school when you feel well enough.   Drink plenty of fluids to avoid dehydration.   For children:Use acetaminophen for fever, fussiness or discomfort. In infants over six months of age, you may use ibuprofen instead of acetaminophen. (NOTE: If your child has chronic liver or kidney disease or ever had a stomach ulcer or GI bleeding, talk with your doctor before using these medicines.) (NOTE: Aspirin should never be used in anyone under 18 years of age who is ill with a fever. It may cause severe liver damage.)   For adults:You may use acetaminophen or ibuprofen to control pain or fever, unless another medicine was prescribed for this. (NOTE: If you have chronic liver or kidney disease or ever had a stomach ulcer or GI bleeding, talk with your doctor before using these medicines.)   Throat lozenges or numbing throat sprays can help reduce pain. Gargling with warm salt water will also help reduce throat pain. For this, dissolve 1/2 teaspoon of salt in 1 glass of warm water. To help soothe a sore throat, children can sip on juice or a popsicle. Children 5 years andolder can also suck on a lollipop or hard candy.   Avoid salty or spicy foods, which can be irritating to the throat.  Follow-up care  Follow up with your healthcare provider or our staff if you are not improving over the next week.  When to seek medical advice  Call your healthcare provider right away if any of these  occur:   Feveras directed by your doctor. For children, seek care if:   Your child is of any age and has repeated fevers above 104F (40C).   Your child is younger than 2 years of age and has a fever of 100.4F (38C) that continues for more than 1 day.   Your child is 2 years old or older and has a fever of 100.4F (38C) that continues for more than 3 days.   New or worsening ear pain, sinus pain, or headache   Painful lumps in the back of neck   Stiff neck   Lymph nodes are getting larger   Inability to swallow liquids, excessive drooling,or inability to open mouth wide due to throat pain   Signs of dehydration (very dark urine or no urine, sunken eyes, dizziness)   Trouble breathing or noisy breathing   Muffled voice   New rash   Child appears to be getting sicker   2000-2015 [employer] [address], [place] [address]. All rights reserved. This information is not intended as a substitute for professional medical care. Always follow your healthcare professional's instructions.        Viral Respiratory Illness [Adult]  You have an Upper Respiratory Illness (URI) caused by a virus. This illness is contagious during the first few days. It is spread through the air by coughing and sneezing or by direct contact (touching the sick person and then touching your own eyes, nose   or mouth). Most viral illnesses go away within 7-10 days with rest and simple home remedies. Sometimes, the illness may last for several weeks. Antibiotics will not kill a virus and are generally not prescribed for this condition.    Home Care:  1) If symptoms are severe, rest at home for the first 2-3 days. When you resume activity, don't let yourself get too tired.  2) Avoid being exposed to cigarette smoke (yours or others').  3) Tylenol (acetaminophen) or ibuprofen (Advil, Motrin) will help fever, muscle aching and headache. (Persons under 18 with fever should not take aspirin since this may cause  liver damage.)  4) Your appetite may be poor, so a light diet is fine. Avoid dehydration by drinking 6-8 glasses of fluids per day (water, soft drinks, juices, tea, soup). Extra fluids will help loosen secretions in the nose and lungs.  5) Over-the-counter cold medicines will not shorten the length of time you're sick, but they may be helpful for the following symptoms: cough (Robitussin DM); sore throat (Chloraseptic lozenges or spray); nasal and sinus congestion (Actifed, Sudafed, Chlortrimeton).  Follow Up  with your doctor or as advised if you don't improve over the next week.  Get Prompt Medical Attention  if any of the following occur:  -- Cough with lots of colored sputum (mucus) or blood in your sputum  -- Chest pain, shortness of breath, wheezing or have trouble breathing  -- Severe headache; face, neck or ear pain  -- Fever over 100.4 F (38.0 C) for more than three days  -- You can't swallow due to throat pain   2000-2015 [employer]. [address], [place] [address]. All rights reserved. This information is not intended as a substitute for professional medical care. Always follow your healthcare professional's instructions.

## 2014-10-20 NOTE — ED Provider Notes (Signed)
Physician/Midlevel provider first contact with patient: 10/20/14 1016         [facility] EMERGENCY DEPARTMENT History and Physical Exam      Patient Name: Kristi Branch, Kristi Branch  Encounter Date:  10/20/2014  Attending Physician: Theora Master, MD  PCP: Christa See, MD  Patient DOB:  [birth year]  MRN:  [id number]  Room:  V40/J81-X      History of Presenting Illness       HPI/ROS limited by:   []  Dementia  []  Pt Unconscious  []  Post-ictal  []  Intoxicated  []  Confused   []  Acuity   Other:    HPI/ROS provided by:   []  Patient   []  Family   []  EMS   []  Friend   []  Caregiver   []  POA   Other:    HPI     23 y.o. female presents with sore throat. The patient works in a [employer] and says she's been getting sick ever since she worked there the last 2 months. She says is cold and has chemicals and she has a reaction to it. She denies any acute allergic reaction. She says that for the last day she's been having a sore throat with a low-grade temperature. She denies ever having strep. She has no sick contacts. She denies pregnancy        Review of Systems     Review of Systems   Constitutional: Negative.  Negative for fever.   HENT: Positive for rhinorrhea and sore throat. Negative for trouble swallowing and voice change.    Eyes: Negative.    Respiratory: Negative.    Cardiovascular: Negative.    Gastrointestinal: Negative.    Genitourinary: Negative.    Musculoskeletal: Negative.    Skin: Negative.    Neurological: Negative.    Psychiatric/Behavioral: Negative.           [x]  Except as marked above as positive, the remainder of the systems above were reviewed and found negative.      Allergies     Pt has No Known Allergies.    Medications     Current Outpatient Rx   Name  Route  Sig  Dispense  Refill   . dexamethasone (DECADRON) 4 MG tablet    Oral    Take 3 tablets (12 mg total) by mouth once.    3 tablet    0     . DISCONTD: hydrOXYzine (ATARAX) 50 MG tablet    Oral    Take 1-2 tablets (50-100 mg total) by  mouth every 8 (eight) hours as needed for Itching.    30 tablet    0     . DISCONTD: ibuprofen (ADVIL,MOTRIN) 600 MG tablet    Oral    Take 1 tablet (600 mg total) by mouth every 6 (six) hours as needed for Pain.    30 tablet    0          Past Medical History     Pt has no past medical history on file.    Past Surgical History     Pt has no past surgical history on file.    Family History     The family history is not on file.    Social History     Pt reports that she has never smoked. She does not have any smokeless tobacco history on file. She reports that she does not drink alcohol or use illicit drugs.    Physical Exam  Blood pressure 140/78, pulse 72, temperature 97.7 F (36.5 C), resp. rate 17, height 1.549 m, weight 86.183 kg, last menstrual period 09/15/2014, SpO2 100 %.    Physical Exam   Constitutional: She is oriented to person, place, and time. Vital signs are normal. She appears well-developed and well-nourished. She is active and cooperative.   HENT:   Head: Normocephalic and atraumatic.   Right Ear: Hearing, tympanic membrane and ear canal normal.   Left Ear: Hearing, tympanic membrane and ear canal normal.   Nose: Nose normal.   Mouth/Throat: Uvula is midline and mucous membranes are normal. No oropharyngeal exudate.   Throat with mild erythema and minimal swelling. There is no exudate. It seems more red on the right. There are no nodes cervically. There is no stridor.   Eyes: Conjunctivae, EOM and lids are normal. Pupils are equal, round, and reactive to light.   Neck: Trachea normal, normal range of motion, full passive range of motion without pain and phonation normal. Neck supple.   Cardiovascular: Normal rate, regular rhythm, S1 normal, S2 normal, normal heart sounds and intact distal pulses.    Pulmonary/Chest: Effort normal and breath sounds normal.   Abdominal: Soft. Normal appearance, normal aorta and bowel sounds are normal.   Musculoskeletal: Normal range of motion.   Neurological:  She is alert and oriented to person, place, and time. She has normal strength.   Skin: Skin is warm, dry and intact.   Psychiatric: She has a normal mood and affect. Her behavior is normal.   Nursing note and vitals reviewed.       Orders Placed     Orders Placed This Encounter   Procedures   . Strep A Rapid Test   . Throat Culture       Diagnostic Results       Labs  Results     Procedure Component Value Units Date/Time    Strep A Rapid Test [[id number]] Collected:  10/20/14 1025    Specimen Information:  Throat Updated:  10/20/14 1042     Strep A, Rapid Negative           Radiologic Studies  No results found.    EKG  []  Viewed by me []  Interpreted by me []  Abnormal: see below        [x]  Reviewed labs and radiology if applicable  [x]  Reviewed Old Records if available        MDM / Critical Care   Blood pressure 140/78, pulse 72, temperature 97.7 F (36.5 C), resp. rate 17, height 1.549 m, weight 86.183 kg, last menstrual period 09/15/2014, SpO2 100 %.    Patient with above. Strep was negative. I will give her 1 dose of Decadron to help with the pain per study showing one dose of steroid helps with viral tonsillitis. She was concerned about working being infectious and she will be off for the next 48 hours.  Discussed with:    []  Admitting Attending  []  Family   []  Radiologist   []  PCP   []  Consultant     Procedures              Diagnosis / Disposition     Clinical Impression  1. Acute viral tonsillitis        Disposition  ED Disposition     Discharge Kristi Branch discharge to home/self care.    Condition at disposition: Stable            Prescriptions  New Prescriptions    DEXAMETHASONE (DECADRON) 4 MG TABLET    Take 3 tablets (12 mg total) by mouth once.                                          Theora Master, MD  10/20/14 [phone]

## 2014-10-20 NOTE — ED Notes (Signed)
C/o of 5/10 sore throat since yesterday w/ runny nose. +dry cough. Denies fever/chills.

## 2015-01-23 ENCOUNTER — Emergency Department
Admission: EM | Admit: 2015-01-23 | Discharge: 2015-01-23 | Disposition: A | Discharge status: Home / Self Care | Payer: Medicaid Other | Attending: Emergency Medicine | Admitting: Emergency Medicine

## 2015-01-23 ENCOUNTER — Emergency Department: Payer: Self-pay

## 2015-01-23 DIAGNOSIS — O210 Mild hyperemesis gravidarum: Secondary | ICD-10-CM | POA: Insufficient documentation

## 2015-01-23 DIAGNOSIS — Z3A00 Weeks of gestation of pregnancy not specified: Secondary | ICD-10-CM | POA: Insufficient documentation

## 2015-01-23 DIAGNOSIS — Z3490 Encounter for supervision of normal pregnancy, unspecified, unspecified trimester: Secondary | ICD-10-CM

## 2015-01-23 LAB — VH POCT URINE HCG
Operator ID: 33681
Urine HCG Qualitative: POSITIVE — AB
Urine Specific Gravity: 1.03 (ref 1.002–1.030)

## 2015-01-23 MED ORDER — LIDOCAINE VISCOUS 2 % MT SOLN
OROMUCOSAL | Status: AC
Start: 2015-01-23 — End: ?
  Filled 2015-01-23: qty 15

## 2015-01-23 MED ORDER — PB-HYOSCY-ATROPINE-SCOPOLAMINE 16.2 MG/5ML PO ELIX
ORAL_SOLUTION | ORAL | Status: AC
Start: 2015-01-23 — End: ?
  Filled 2015-01-23: qty 10

## 2015-01-23 MED ORDER — ALUM & MAG HYDROXIDE-SIMETH 200-200-20 MG/5ML PO SUSP
ORAL | Status: AC
Start: 2015-01-23 — End: ?
  Filled 2015-01-23: qty 30

## 2015-01-23 MED ORDER — PB-HYOSCY-ATROPINE-SCOPOLAMINE 16.2 MG/5ML PO ELIX
ORAL_SOLUTION | Freq: Once | ORAL | Status: AC
Start: 2015-01-23 — End: 2015-01-23

## 2015-01-23 MED ORDER — ONDANSETRON 4 MG PO TBDP
ORAL_TABLET | ORAL | Status: AC
Start: 2015-01-23 — End: ?
  Filled 2015-01-23: qty 2

## 2015-01-23 MED ORDER — ONDANSETRON 4 MG PO TBDP
8.0000 mg | ORAL_TABLET | Freq: Once | ORAL | Status: AC
Start: 2015-01-23 — End: 2015-01-23
  Administered 2015-01-23: 8 mg via ORAL

## 2015-01-23 NOTE — ED Provider Notes (Signed)
Physician/Midlevel provider first contact with patient: 01/23/15 1248         History     Chief Complaint   Patient presents with   . Emesis    24 year old female who presents with a three-day history of nausea and vomiting. She did not vomit today but vomited once yesterday. He complains of indigestion and epigastric discomfort. She denies vaginal discharge or bleeding. She denies fevers night sweats or chills. Rest of the review systems are negative.          History reviewed. No pertinent past medical history.    History reviewed. No pertinent past surgical history.    History reviewed. No pertinent family history.    Social  History   Substance Use Topics   . Smoking status: Never Smoker    . Smokeless tobacco: Not on file   . Alcohol Use: No       .     No Known Allergies    Home Medications     Last Medication Reconciliation Action:  In Progress Breck Coons, RN 01/23/2015  2:06 PM          No Medications           Review of Systems   Constitutional: Negative for fever, chills, diaphoresis, activity change, appetite change and fatigue.   HENT: Negative for congestion, dental problem, drooling, ear discharge, ear pain, facial swelling, hearing loss, nosebleeds, postnasal drip, rhinorrhea, sinus pressure, sneezing and tinnitus.    Respiratory: Negative for cough, chest tightness, shortness of breath and wheezing.    Cardiovascular: Negative for chest pain and palpitations.   Gastrointestinal: Positive for nausea and vomiting. Negative for abdominal pain and diarrhea.   Genitourinary: Negative for dysuria, urgency and difficulty urinating.   Musculoskeletal: Negative for back pain, arthralgias, gait problem, neck pain and neck stiffness.   Neurological: Negative for headaches.   Psychiatric/Behavioral: Negative for behavioral problems and agitation.       Physical Exam    BP: 124/76 mmHg, Heart Rate: 97, Temp: 98.2 F (36.8 C), Resp Rate: 18, SpO2: 100 %, Weight: 119.6 kg     Physical Exam    Constitutional: She is oriented to person, place, and time. She appears well-developed and well-nourished.   HENT:   Head: Normocephalic and atraumatic.   Right Ear: External ear normal.   Left Ear: External ear normal.   Nose: Nose normal.   Mouth/Throat: Oropharynx is clear and moist.   Eyes: Conjunctivae and EOM are normal. Pupils are equal, round, and reactive to light.   Neck: Normal range of motion. Neck supple.   Cardiovascular: Normal rate, regular rhythm, normal heart sounds and intact distal pulses.    Pulmonary/Chest: Effort normal and breath sounds normal.   Abdominal: Soft. Bowel sounds are normal.   Musculoskeletal: Normal range of motion.   Neurological: She is alert and oriented to person, place, and time. She has normal reflexes.   Skin: Skin is warm and dry.   Psychiatric: She has a normal mood and affect. Her behavior is normal. Thought content normal.   Nursing note and vitals reviewed.        MDM and ED Course     ED Medication Orders     Start Ordered     Status Ordering Provider    01/23/15 1317 01/23/15 1316  ondansetron (ZOFRAN-ODT) disintegrating tablet 8 mg   Once in ED     Route: Oral  Ordered Dose: 8 mg     Last  MAR action:  Given Dedra Skeens    01/23/15 1317 01/23/15 1316  GI cocktail (donnatol/viscous lidocaine/Maalox) suspension 50 mL   Once in ED     Route: Oral     Last MAR action:  Given Merrill Deanda WADE              MDM  Number of Diagnoses or Management Options  Early stage of pregnancy:   Hyperemesis arising during pregnancy:      Amount and/or Complexity of Data Reviewed  Clinical lab tests: reviewed  Tests in the medicine section of CPT: reviewed  Decide to obtain previous medical records or to obtain history from someone other than the patient: yes  Review and summarize past medical records: yes  Discuss the patient with other providers: yes  Independent visualization of images, tracings, or specimens: yes    Risk of Complications, Morbidity, and/or  Mortality  Presenting problems: moderate  Diagnostic procedures: moderate  Management options: moderate    Critical Care  Total time providing critical care: < 30 minutes    Patient Progress  Patient progress: stable         Procedures    Clinical Impression & Disposition     Clinical Impression  Final diagnoses:   Early stage of pregnancy   Hyperemesis arising during pregnancy        ED Disposition     Discharge Eino Farber Patricio discharge to home/self care.    Condition at disposition: Stable             There are no discharge medications for this patient.                  Dedra Skeens, MD  01/24/15 [phone]

## 2015-02-09 LAB — VH STD AMPLIFIED DNA PROBE
Chlamydia trachomatis: NEGATIVE
Neisseria gonorrhoeae: NEGATIVE

## 2015-02-09 LAB — HEPATITIS B SURFACE ANTIGEN W/ REFLEX TO CONFIRMATION: Hepatitis B Surface Antigen: NEGATIVE

## 2015-02-09 LAB — VH RUBELLA SCREEN, SERUM: Rubella IgG: IMMUNE

## 2015-02-09 LAB — RPR: RPR: NONREACTIVE

## 2015-02-09 LAB — HIV AG/AB 4TH GENERATION: HIV Ag/Ab, 4th Generation: NEGATIVE

## 2015-03-29 ENCOUNTER — Other Ambulatory Visit: Payer: Self-pay | Admitting: Maternal & Fetal Medicine

## 2015-03-29 ENCOUNTER — Ambulatory Visit
Admission: RE | Admit: 2015-03-29 | Discharge: 2015-03-29 | Disposition: A | Discharge status: Home / Self Care | Payer: Medicaid Other | Source: Ambulatory Visit | Attending: Family | Admitting: Family

## 2015-03-29 DIAGNOSIS — Z3490 Encounter for supervision of normal pregnancy, unspecified, unspecified trimester: Secondary | ICD-10-CM

## 2015-03-29 DIAGNOSIS — O99212 Obesity complicating pregnancy, second trimester: Secondary | ICD-10-CM | POA: Insufficient documentation

## 2015-03-29 DIAGNOSIS — B009 Herpesviral infection, unspecified: Secondary | ICD-10-CM

## 2015-03-29 DIAGNOSIS — Z3A20 20 weeks gestation of pregnancy: Secondary | ICD-10-CM | POA: Insufficient documentation

## 2015-03-29 DIAGNOSIS — Z6841 Body Mass Index (BMI) 40.0 and over, adult: Secondary | ICD-10-CM | POA: Insufficient documentation

## 2015-03-29 DIAGNOSIS — O4402 Placenta previa specified as without hemorrhage, second trimester: Secondary | ICD-10-CM | POA: Insufficient documentation

## 2015-03-29 HISTORY — DX: Herpesviral infection, unspecified: B00.9

## 2015-04-07 ENCOUNTER — Emergency Department
Admission: EM | Admit: 2015-04-07 | Discharge: 2015-04-07 | Disposition: A | Discharge status: Home / Self Care | Payer: Medicaid Other | Attending: Emergency Medicine | Admitting: Emergency Medicine

## 2015-04-07 ENCOUNTER — Emergency Department: Payer: Medicaid Other

## 2015-04-07 DIAGNOSIS — Z3A22 22 weeks gestation of pregnancy: Secondary | ICD-10-CM | POA: Insufficient documentation

## 2015-04-07 DIAGNOSIS — R21 Rash and other nonspecific skin eruption: Secondary | ICD-10-CM | POA: Insufficient documentation

## 2015-04-07 DIAGNOSIS — O26892 Other specified pregnancy related conditions, second trimester: Secondary | ICD-10-CM | POA: Insufficient documentation

## 2015-04-07 MED ORDER — VALACYCLOVIR HCL 1 G PO TABS
1000.0000 mg | ORAL_TABLET | Freq: Three times a day (TID) | ORAL | Status: AC
Start: 2015-04-07 — End: 2015-04-14

## 2015-04-07 MED ORDER — CEPHALEXIN 500 MG PO CAPS
500.0000 mg | ORAL_CAPSULE | Freq: Four times a day (QID) | ORAL | Status: AC
Start: 2015-04-07 — End: 2015-04-14

## 2015-04-07 MED ORDER — PROPARACAINE HCL 0.5 % OP SOLN
OPHTHALMIC | Status: AC
Start: 2015-04-07 — End: ?
  Filled 2015-04-07: qty 15

## 2015-04-07 MED ORDER — FLUORESCEIN SODIUM 1 MG OP STRP
ORAL_STRIP | OPHTHALMIC | Status: AC
Start: 2015-04-07 — End: ?
  Filled 2015-04-07: qty 1

## 2015-04-07 NOTE — ED Provider Notes (Signed)
Physician/Midlevel provider first contact with patient: 04/07/15 2014           [facility] EMERGENCY DEPARTMENT  History and Physical Exam          Patient: Kristi Branch  Encounter Date: 04/07/2015    DOB: July 09, 1991  Age/Sex: 24 y.o. female    MRN: [id number]  Room: E54/E54-A    PCP: Christa See, MD  Attending: Magdalene Molly, MD        H&P (loc / qual / severity / dura / tim) ROS       Kristi Branch is a 24 y.o. female who presents with chief complaint of rash. Location right side of the face, quality itchy and slightly burning, severity moderate, duration since yesterday.    Pt is about [redacted] weeks pregnant and has a hx of occasional facial rash on the right side in the summer. It is been sometime since she's had this. This particular rash is worse than what she is experienced in the past. It doesn't usually have a whitish appearance. Pt also had adult onset chickenpox. Pt denies fever. Pt denies nausea or chest pain or headache. Pt has a bit of a burning sensation around her eye. Her eye is watering a bit as well.           HPI/ROS limits: none  Hx given by: patient    Review of Systems   Constitutional: Negative for fever.   Respiratory: Negative for cough and shortness of breath.    Cardiovascular: Negative for chest pain.   Gastrointestinal: Negative for nausea, vomiting, abdominal pain and diarrhea.   Genitourinary: Negative for dysuria.   Skin: Positive for rash.   Neurological: Negative for headaches.   All other systems reviewed and are negative.           ALL / MEDS / PMH / PSH / PFH / SH     Pt has No Known Allergies.    Previous Medications    No medications on file       History reviewed. No pertinent past medical history.    History reviewed. No pertinent past surgical history.    The family history is not on file.    Pt reports that she has never smoked. She has never used smokeless tobacco. She reports that she does not drink alcohol or use illicit drugs.          Physical Exam       Constitutional: Patient appears comfortable.   Eyes: Pupils equal. EOMI. Visual acuity noted. Flourescein staining shows no uptake or abrasion.   ENMT: NL appearance of ears/nose. MMM.   Respiratory: Lungs are clear bilaterally. No work of breathing.   Cardiovascular: Heart regular rate and rhythm. No leg edema bilaterally.   GI/GU: Abdomen is soft, nontender; no guarding or rigidity. Pt is gravid.   Skin: Skin is warm and dry. Confluence of pustules on the right upper face.  Surrounding rim of erythema around the lesions.   Neurologic: Awake and alert. Memory intact.   Psychiatric: Normal affect and insight; no agitation.   Musculoskeletal: Neck is supple. Trachea is midline.     Blood pressure 130/70, pulse 120, temperature 98.2 F (36.8 C), resp. rate 20, weight 124.5 kg, SpO2 99 %.         Labs and Imaging     Labs  Labs Reviewed   [facility] CULTURE AND SMEAR, WOUND   VH HERPES SIMPLEX VIRUS (HSV) BY RAPID PCR  Radiologic Studies  No results found.    ED Medication Orders     None            EKG: none         Procedures & Critical Care / MDM     No procedures or critical care.    The differential diagnosis includes, but is not limited to RHUS DERMATITIS (i.e. poison ivy, oak, sumac), SCABIES, CHICKEN POX, ECZEMA, HIVES, RINGWORM, PITYRIASIS ROSEA.    9:12 PM  I checked the eye to see if there are any lesions on the cornea. I went ahead and cultured the pustules for both viral and bacterial issues. I spoke with Dr. Rosilyn Mings and we will go ahead and treat the pt with both Keflex and valacyclovir. Pt can follow-up with her OB for further management. I would lean toward a bacterial infection however I'm certainly not 100% confident.      All questions have been answered. Pt is appreciative of care.         Diagnosis       Final diagnoses:   Facial rash   Pregnancy      ED Disposition     Discharge [facility] discharge to home/self care.    Condition at disposition: Stable             New Prescriptions    CEPHALEXIN (KEFLEX) 500 MG CAPSULE    Take 1 capsule (500 mg total) by mouth 4 (four) times daily.    VALACYCLOVIR HCL (VALTREX) 1000 MG TABLET    Take 1 tablet (1,000 mg total) by mouth 3 (three) times daily.       In addition to the above history, please see nursing notes.  Allergies, meds, past medical, family, social hx, and the results of the diagnostic studies performed have been reviewed.  This is note has been created using an EMR that may contain additions or subtractions not intended by myself, Magdalene Molly, MD.                 Magdalene Molly, MD  04/07/15 2114

## 2015-04-10 LAB — VH HERPES SIMPLEX VIRUS (HSV) BY RAPID PCR FLUID AND TISSUE - HLAB
Herpes Simplex Virus 1 PCR: POSITIVE
Herpes Simplex Virus 2 PCR: NEGATIVE

## 2015-04-11 NOTE — ED Notes (Signed)
Pt informed on + test results; verbalizes understanding to complete valtrex, and practice care with using objects that could be shared by other people.

## 2015-06-21 ENCOUNTER — Ambulatory Visit
Admission: RE | Admit: 2015-06-21 | Discharge: 2015-06-21 | Disposition: A | Discharge status: Home / Self Care | Payer: Medicaid Other | Source: Ambulatory Visit | Attending: Family | Admitting: Family

## 2015-06-21 ENCOUNTER — Other Ambulatory Visit: Payer: Self-pay | Admitting: Maternal & Fetal Medicine

## 2015-06-21 DIAGNOSIS — Z3490 Encounter for supervision of normal pregnancy, unspecified, unspecified trimester: Secondary | ICD-10-CM

## 2015-06-21 DIAGNOSIS — Z3A32 32 weeks gestation of pregnancy: Secondary | ICD-10-CM | POA: Insufficient documentation

## 2015-06-21 DIAGNOSIS — O4403 Placenta previa specified as without hemorrhage, third trimester: Secondary | ICD-10-CM | POA: Insufficient documentation

## 2015-06-21 DIAGNOSIS — O99213 Obesity complicating pregnancy, third trimester: Secondary | ICD-10-CM | POA: Insufficient documentation

## 2015-06-21 DIAGNOSIS — Z6841 Body Mass Index (BMI) 40.0 and over, adult: Secondary | ICD-10-CM | POA: Insufficient documentation

## 2015-06-21 DIAGNOSIS — Z0372 Encounter for suspected placental problem ruled out: Secondary | ICD-10-CM | POA: Insufficient documentation

## 2015-07-19 LAB — GROUP B STREP TRANSCRIBED: GBS Transcribed: NEGATIVE

## 2015-08-01 ENCOUNTER — Emergency Department
Admission: EM | Admit: 2015-08-01 | Discharge: 2015-08-01 | Disposition: A | Discharge status: Home / Self Care | Payer: Medicaid Other | Attending: Gynecology | Admitting: Gynecology

## 2015-08-01 ENCOUNTER — Emergency Department: Admission: EM | Admit: 2015-08-01 | Payer: Medicaid Other | Source: Home / Self Care | Admitting: Gynecology

## 2015-08-01 ENCOUNTER — Emergency Department: Payer: Medicaid Other | Admitting: Gynecology

## 2015-08-01 DIAGNOSIS — O133 Gestational [pregnancy-induced] hypertension without significant proteinuria, third trimester: Secondary | ICD-10-CM | POA: Diagnosis present

## 2015-08-01 DIAGNOSIS — R102 Pelvic and perineal pain: Secondary | ICD-10-CM | POA: Diagnosis present

## 2015-08-01 DIAGNOSIS — Z3A38 38 weeks gestation of pregnancy: Secondary | ICD-10-CM | POA: Insufficient documentation

## 2015-08-01 DIAGNOSIS — O26893 Other specified pregnancy related conditions, third trimester: Secondary | ICD-10-CM | POA: Insufficient documentation

## 2015-08-01 DIAGNOSIS — M549 Dorsalgia, unspecified: Secondary | ICD-10-CM | POA: Insufficient documentation

## 2015-08-01 DIAGNOSIS — O99213 Obesity complicating pregnancy, third trimester: Secondary | ICD-10-CM | POA: Insufficient documentation

## 2015-08-01 HISTORY — DX: Obesity, unspecified: E66.9

## 2015-08-01 LAB — VH URINE DRUG SCREEN
Amphetamine: NEGATIVE
Barbiturates: NEGATIVE
Buprenorphine, Urine: NEGATIVE
Cannabinoids: NEGATIVE
Cocaine: NEGATIVE
Opiates: NEGATIVE
Phencyclidine: NEGATIVE
Urine Benzodiazepines: NEGATIVE
Urine Creatinine Random: 90.56 mg/dL
Urine Methadone Screen: NEGATIVE
Urine Oxycodone: NEGATIVE
Urine Specific Gravity: 1.015 (ref 1.001–1.040)
pH, Urine: 6.1 pH (ref 5.0–8.0)

## 2015-08-01 LAB — VH URINALYSIS WITH MICROSCOPIC
Bilirubin, UA: NEGATIVE
Blood, UA: NEGATIVE
Glucose, UA: NEGATIVE mg/dL
Ketones UA: NEGATIVE mg/dL
Leukocyte Esterase, UA: 75 Leu/uL — AB
Nitrite, UA: NEGATIVE
Protein, UR: NEGATIVE mg/dL
RBC, UA: <1 /hpf (ref 0–5)
Squam Epithel, UA: 5 /hpf — ABNORMAL HIGH (ref 0–2)
Urine Specific Gravity: 1.014 (ref 1.001–1.040)
Urobilinogen, UA: NORMAL mg/dL
WBC, UA: 1 /hpf (ref 0–4)
pH, Urine: 6 pH (ref 5.0–8.0)

## 2015-08-01 LAB — COMPREHENSIVE METABOLIC PANEL
ALT: 10 U/L (ref 0–55)
AST (SGOT): 15 U/L (ref 10–42)
Albumin/Globulin Ratio: 0.63 Ratio — ABNORMAL LOW (ref 0.70–1.50)
Albumin: 2.4 gm/dL — ABNORMAL LOW (ref 3.5–5.0)
Alkaline Phosphatase: 174 U/L — ABNORMAL HIGH (ref 40–145)
Anion Gap: 14.5 mMol/L (ref 7.0–18.0)
BUN / Creatinine Ratio: 8.1 Ratio — ABNORMAL LOW (ref 10.0–30.0)
BUN: 5 mg/dL — ABNORMAL LOW (ref 7–22)
Bilirubin, Total: 0.2 mg/dL (ref 0.1–1.2)
CO2: 17.7 mMol/L — ABNORMAL LOW (ref 20.0–30.0)
Calcium: 9.1 mg/dL (ref 8.5–10.5)
Chloride: 108 mMol/L (ref 98–110)
Creatinine: 0.62 mg/dL (ref 0.60–1.20)
EGFR: >60 mL/min/{1.73_m2}
Globulin: 3.8 gm/dL (ref 2.0–4.0)
Glucose: 83 mg/dL (ref 70–99)
Osmolality Calc: 268 mOsm/kg — ABNORMAL LOW (ref 275–300)
Potassium: 4.2 mMol/L (ref 3.5–5.3)
Protein, Total: 6.2 gm/dL (ref 6.0–8.3)
Sodium: 136 mMol/L (ref 136–147)

## 2015-08-01 LAB — CBC AND DIFFERENTIAL
Basophils %: 0.3 % (ref 0.0–3.0)
Basophils Absolute: 0 10*3/uL (ref 0.0–0.3)
Eosinophils %: 1.2 % (ref 0.0–7.0)
Eosinophils Absolute: 0.1 10*3/uL (ref 0.0–0.8)
Hematocrit: 36.3 % (ref 36.0–48.0)
Hemoglobin: 12.2 gm/dL (ref 12.0–16.0)
Lymphocytes Absolute: 1.4 10*3/uL (ref 0.6–5.1)
Lymphocytes: 15.3 % (ref 15.0–46.0)
MCH: 26 pg — ABNORMAL LOW (ref 28–35)
MCHC: 34 gm/dL (ref 32–36)
MCV: 78 fL — ABNORMAL LOW (ref 80–100)
MPV: 8.4 fL (ref 6.0–10.0)
Monocytes Absolute: 0.8 10*3/uL (ref 0.1–1.7)
Monocytes: 8.2 % (ref 3.0–15.0)
Neutrophils %: 74.9 % (ref 42.0–78.0)
Neutrophils Absolute: 7 10*3/uL (ref 1.7–8.6)
PLT CT: 283 10*3/uL (ref 130–440)
RBC: 4.69 10*6/uL (ref 3.80–5.00)
RDW: 16 % — ABNORMAL HIGH (ref 11.0–14.0)
WBC: 9.4 10*3/uL (ref 4.0–11.0)

## 2015-08-01 LAB — URIC ACID: Uric acid: 5.6 mg/dL (ref 2.0–7.0)

## 2015-08-01 NOTE — Discharge Instructions (Signed)
Call your OB Provider, or return to the hospital, if you have any of the following signs:     If more than 36 weeks, regular painful contractions every 3-5 minutes for one hour that increase in strength.     If less than 36 weeks, 6 or more contractions in an hour.     Persistent nausea, vomiting or diarrhea; accompanied by the inability to keep liquids down.     Leaking of amniotic fluid, or water breaking; this can be a gush or small trickle.     Vaginal bleeding, especially any bright red bleeding like a period or passing clots (it is normal to have spotting after vaginal exam or intercourse).     Concern about a decrease in your baby's movements.     Temperature greater than 100.4(F) orally.     Severe headache which is not relieved 60 minutes after taking Tylenol (Acetaminophen); Blurry vision or spots before your eyes, not just when you suddenly stand up; Severe heartburn or pain on the upper rights side of your abdomen that is not relieved by an antacid; Sudden increase in swelling in your face, hands, or feet.Patient given supplies and instructed in 24 hr urine collection process,and  when and where to return specimen.

## 2015-08-01 NOTE — OB ED Provider Note (Addendum)
OBED PROGRESS NOTE    Date Time: 08/01/2015 9:20 AM  Patient Name: Kristi Branch    Principal Problem:   Low back pain, pelvic pressure     Subjective:   Patient is a 24 y.o., G1P0  @ [redacted]w[redacted]d who presents to the OBED complaining of back pain which began yesterday, eased up last night she was able to sleep through the night, and began again this am upon rising.  Per the patient the pain comes and goes but has a low level constant component to it.  Also complaining of pelvic pressure which began with the low back pain.  Patient has not tried any OTC medications.  Patient rates the pain a 7/10.  Good FM, no LOF, no VB, no vaginal discharge.  Denies HA, CP, SOB, Calf tenderness, Visual changes or Epigastric pain.    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    OB History     Gravida Para Term Preterm AB TAB SAB Ectopic Multiple Living    1 0 0 0 0 0 0 0 0 0              Review of Systems:     Review of Systems - History obtained from the patient  General ROS: negative for - fever, malaise or night sweats  Psychological ROS: negative for - behavioral disorder, depression or mood swings  Ophthalmic ROS: negative for - blurry vision, double vision or scotomata  Allergy and Immunology ROS: negative for - nasal congestion or seasonal allergies  Respiratory ROS: no cough, shortness of breath, or wheezing  Cardiovascular ROS: no chest pain or dyspnea on exertion  Gastrointestinal ROS: negative for - change in bowel habits, constipation, diarrhea or nausea/vomiting  Genito-Urinary ROS: negative for - dysuria, hematuria, incontinence or urinary frequency/urgency  Musculoskeletal ROS: negative for - gait disturbance, joint pain, joint swelling or muscle pain  Neurological ROS: negative for - behavioral changes, dizziness, headaches or memory loss      Medications:     Prescriptions prior to admission   Medication Sig Dispense Refill Last Dose   . Prenatal MV-Min-Fe Fum-FA-DHA  (PRENATAL 1 PO) Take 1 tablet by mouth.            Physical Exam:     Filed Vitals:    08/01/15 0905   Temp: 98.6 F (37 C)       ABDOMIN--NABS, nontender, nondistended  UTERUS--gravid, nontender  EXT--no edema, normal DTRs  PELVIC--cx: long/thick/closed/high    NST:   NST reviewed:  NST:  reactive  Fetal Heart Baseline Rate: 160 BPM moderate variability, Accelerations seen, No decelerations seen and Category 1      Labs:     Results     ** No results found for the last 24 hours. **          No results found for: WBC, HGB, PLT, NA, K, BUN, CREAT, MG, AST, ALB, BNP, LDH, INR, TACROLIMUS, LDL      Assessment:   24 y.o. G1P0000 @ [redacted]w[redacted]d with low back pain and pelvic pressure.     Plan:   1.  NST  2.  UA/UDS  3.  While waiting for the UA results will do continuous fetal monitoring to see if any contractions are recorded.  If we record a contraction pattern will keep her and observe for cervical change, if none will discharge after UA resulted.    Records available reviewed.  Signed by: Carmie End, DO           Patient observed x 2 hrs, no uterine contractions.  UA negative including negative for protein.  But, serial BP's consistently 140-150/90's.  CBC, CMP WNL, Uric Acid 5.6.  Patient denies any HA, CP, SOB, Epigastric pain, Visual disturbances, nausea or vomiting.  Category I tracing while being monitored.  No evidence of acute maternal/fetal compromise.  Patient has an office appointment tomorrow, will send the patient home with a 24hr urine collection which she can turn in at her visit in the office.  Reviewed signs/risks of preeclampsia, if the patient has any concerns should return to the OBED immediately.  Patient verbalized understanding.

## 2015-08-01 NOTE — Progress Notes (Signed)
Dr Karmen Bongo at bedside. Discharge orders received. Discharge instructions given to patient including 24 hour urine collection. Verbally acknowledges understanding. Discharged home. Denies needs. Candyce Churn Cleda Imel

## 2015-08-05 ENCOUNTER — Encounter: Payer: Self-pay | Admitting: Anesthesiology

## 2015-08-05 ENCOUNTER — Inpatient Hospital Stay: Payer: Medicaid Other | Admitting: Obstetrics & Gynecology

## 2015-08-05 ENCOUNTER — Inpatient Hospital Stay
Admission: RE | Admit: 2015-08-05 | Discharge: 2015-08-10 | DRG: 765 | Disposition: A | Discharge status: Home / Self Care | Payer: Medicaid Other | Attending: Obstetrics & Gynecology | Admitting: Obstetrics & Gynecology

## 2015-08-05 DIAGNOSIS — Z3490 Encounter for supervision of normal pregnancy, unspecified, unspecified trimester: Secondary | ICD-10-CM

## 2015-08-05 DIAGNOSIS — Z98891 History of uterine scar from previous surgery: Secondary | ICD-10-CM | POA: Diagnosis not present

## 2015-08-05 DIAGNOSIS — Z3A39 39 weeks gestation of pregnancy: Secondary | ICD-10-CM

## 2015-08-05 DIAGNOSIS — D62 Acute posthemorrhagic anemia: Secondary | ICD-10-CM | POA: Diagnosis not present

## 2015-08-05 DIAGNOSIS — O133 Gestational [pregnancy-induced] hypertension without significant proteinuria, third trimester: Secondary | ICD-10-CM | POA: Diagnosis present

## 2015-08-05 DIAGNOSIS — O99214 Obesity complicating childbirth: Secondary | ICD-10-CM | POA: Diagnosis present

## 2015-08-05 DIAGNOSIS — O1213 Gestational proteinuria, third trimester: Secondary | ICD-10-CM | POA: Diagnosis present

## 2015-08-05 DIAGNOSIS — O9081 Anemia of the puerperium: Secondary | ICD-10-CM | POA: Diagnosis not present

## 2015-08-05 DIAGNOSIS — Z6841 Body Mass Index (BMI) 40.0 and over, adult: Secondary | ICD-10-CM

## 2015-08-05 DIAGNOSIS — O1404 Mild to moderate pre-eclampsia, complicating childbirth: Principal | ICD-10-CM | POA: Diagnosis present

## 2015-08-05 DIAGNOSIS — Z8759 Personal history of other complications of pregnancy, childbirth and the puerperium: Secondary | ICD-10-CM | POA: Diagnosis not present

## 2015-08-05 HISTORY — DX: Anemia, unspecified: D64.9

## 2015-08-05 LAB — VH URINE DRUG SCREEN - NO CONFIRMATION
Amphetamine: NEGATIVE
Barbiturates: NEGATIVE
Buprenorphine, Urine: NEGATIVE
Cannabinoids: NEGATIVE
Cocaine: NEGATIVE
Opiates: NEGATIVE
Phencyclidine: NEGATIVE
Urine Benzodiazepines: NEGATIVE
Urine Creatinine Random: 122.1 mg/dL
Urine Methadone Screen: NEGATIVE
Urine Oxycodone: NEGATIVE
Urine Specific Gravity: 1.02 (ref 1.001–1.040)
pH, Urine: 6 pH (ref 5.0–8.0)

## 2015-08-05 LAB — CBC AND DIFFERENTIAL
Basophils %: 0.1 % (ref 0.0–3.0)
Basophils Absolute: 0 10*3/uL (ref 0.0–0.3)
Eosinophils %: 1 % (ref 0.0–7.0)
Eosinophils Absolute: 0.1 10*3/uL (ref 0.0–0.8)
Hematocrit: 37.6 % (ref 36.0–48.0)
Hemoglobin: 12.5 gm/dL (ref 12.0–16.0)
Lymphocytes Absolute: 1.7 10*3/uL (ref 0.6–5.1)
Lymphocytes: 15.1 % (ref 15.0–46.0)
MCH: 26 pg — ABNORMAL LOW (ref 28–35)
MCHC: 33 gm/dL (ref 32–36)
MCV: 78 fL — ABNORMAL LOW (ref 80–100)
MPV: 8.7 fL (ref 6.0–10.0)
Monocytes Absolute: 0.9 10*3/uL (ref 0.1–1.7)
Monocytes: 7.8 % (ref 3.0–15.0)
Neutrophils %: 76 % (ref 42.0–78.0)
Neutrophils Absolute: 8.6 10*3/uL (ref 1.7–8.6)
PLT CT: 263 10*3/uL (ref 130–440)
RBC: 4.85 10*6/uL (ref 3.80–5.00)
RDW: 16.3 % — ABNORMAL HIGH (ref 11.0–14.0)
WBC: 11.2 10*3/uL — ABNORMAL HIGH (ref 4.0–11.0)

## 2015-08-05 LAB — TYPE AND SCREEN
AB Screen: NEGATIVE
ABO Rh: O POS

## 2015-08-05 MED ORDER — ACETAMINOPHEN 650 MG RE SUPP
650.0000 mg | RECTAL | Status: DC | PRN
Start: 2015-08-05 — End: 2015-08-07

## 2015-08-05 MED ORDER — LACTATED RINGERS IV SOLN
INTRAVENOUS | Status: DC
Start: 2015-08-05 — End: 2015-08-07

## 2015-08-05 MED ORDER — SODIUM CHLORIDE 0.9 % IJ SOLN
3.0000 mL | INTRAMUSCULAR | Status: DC | PRN
Start: 2015-08-05 — End: 2015-08-10
  Administered 2015-08-05: 3 mL via INTRAVENOUS

## 2015-08-05 MED ORDER — ONDANSETRON HCL 4 MG/2ML IJ SOLN
8.0000 mg | Freq: Three times a day (TID) | INTRAMUSCULAR | Status: DC | PRN
Start: 2015-08-05 — End: 2015-08-07

## 2015-08-05 MED ORDER — MISOPROSTOL 100 MCG PO TABS
50.0000 ug | ORAL_TABLET | ORAL | Status: AC | PRN
Start: 2015-08-05 — End: 2015-08-05
  Administered 2015-08-05: 50 ug via ORAL
  Filled 2015-08-05: qty 1

## 2015-08-05 MED ORDER — DINOPROSTONE 10 MG VA INST
10.0000 mg | VAGINAL_INSERT | Freq: Once | VAGINAL | Status: AC
Start: 2015-08-05 — End: 2015-08-05
  Administered 2015-08-05: 10 mg via VAGINAL
  Filled 2015-08-05 (×2): qty 1

## 2015-08-05 MED ORDER — FAMOTIDINE 10 MG/ML IV SOLN (WRAP)
20.0000 mg | Freq: Two times a day (BID) | INTRAVENOUS | Status: DC | PRN
Start: 2015-08-05 — End: 2015-08-07

## 2015-08-05 MED ORDER — ZOLPIDEM TARTRATE 5 MG PO TABS
10.0000 mg | ORAL_TABLET | Freq: Every evening | ORAL | Status: DC | PRN
Start: 2015-08-05 — End: 2015-08-07

## 2015-08-05 MED ORDER — ONDANSETRON 4 MG PO TBDP
4.0000 mg | ORAL_TABLET | Freq: Three times a day (TID) | ORAL | Status: DC | PRN
Start: 2015-08-05 — End: 2015-08-07

## 2015-08-05 MED ORDER — FENTANYL CITRATE (PF) 50 MCG/ML IJ SOLN (WRAP)
100.0000 ug | INTRAMUSCULAR | Status: DC | PRN
Start: 2015-08-05 — End: 2015-08-07

## 2015-08-05 MED ORDER — ACETAMINOPHEN 325 MG PO TABS
650.0000 mg | ORAL_TABLET | ORAL | Status: DC | PRN
Start: 2015-08-05 — End: 2015-08-07

## 2015-08-05 MED ORDER — MISOPROSTOL 100 MCG PO TABS
25.0000 ug | ORAL_TABLET | ORAL | Status: DC
Start: 2015-08-05 — End: 2015-08-05
  Administered 2015-08-05: 25 ug via ORAL

## 2015-08-05 NOTE — H&P (Signed)
OB LABOR AND DELIVERY ADMISSION H&P    Date/Time: 10/08/201610:28 AM       Name: Kristi Branch, Kristi Branch  MRN: [id number]    Chief Complaint   Patient presents with   . Scheduled Induction     HPI:   Kristi Branch is a 24 y.o. G16P0000 female with a gestation age of [redacted]w[redacted]d and Estimated Date of Delivery: 08/14/15 who presents to the hospital for:  Induction of labor    Her prenatal care is significant for Morbid obesity, anemia, Proteinuria in 3rd trimester, History of gestational hypertension      Review of Systems:     Constitutional: Negative for fever   Eyes: Negative for blurred vision and double vision.   Respiratory: Negative for cough, shortness of breath and wheezing.    Cardiovascular: Negative for chest pain  Gastrointestinal: Negative for heartburn, nausea, diarrhea and constipation.   Genitourinary: Negative for dysuria, urgency and flank pain.   Skin: Negative for rash.   Neurological: Negative for dizziness, seizures and headaches.         OB History     Gravida Para Term Preterm AB TAB SAB Ectopic Multiple Living    1 0 0 0 0 0 0 0 0 0           Past Medical History   Diagnosis Date   . Obesity    . 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 Treated in ED.   Marland Kitchen Anemia        History reviewed. No pertinent past surgical history.    No Known Allergies    Prescriptions prior to admission   Medication Sig Dispense Refill Last Dose   . ferrous sulfate 325 (65 FE) MG tablet Take 325 mg by mouth 2 (two) times daily.      . Prenatal MV-Min-Fe Fum-FA-DHA (PRENATAL 1 PO) Take 1 tablet by mouth.          Social History     Social History   . Marital Status: Legally Separated     Spouse Name: N/A   . Number of Children: N/A   . Years of Education: N/A     Occupational History   . Not on file.     Social History Main Topics   . Smoking status: Never Smoker    . Smokeless tobacco: Never Used   . Alcohol Use: No   . Drug Use: No   . Sexual Activity:     Partners: Male     Other Topics Concern   .  Not on file     Social History Narrative         Vital Signs:     Temp:  [98.8 F (37.1 C)] 98.8 F (37.1 C)  Heart Rate:  [122] 122  Resp Rate:  [22] 22  BP: (128)/(79) 128/79 mmHg  Body mass index is 51.74 kg/(m^2).    Physical Exam:   Physical Exam   Constitutional: She is oriented to person, place, and time. She appears well-developed.   Cervix:Dilation: 1  Effacement (%): 50  Method: Manual  OB Examiner: Jadie Comas   Bishops score:3  FHT: Baseline Rate: 145 BPM    Head: Normocephalic and atraumatic.   Eyes: Conjunctivae normal and EOM are normal. No scleral icterus.   Neck: Normal range of motion. Neck supple. No tracheal deviation present. No thyromegaly present.   CVS exam: normal rate, regular rhythm, normal S1, S2, no murmurs, rubs, clicks or gallops.  Pulmonary/Chest: Effort normal. No respiratory distress.   Abdominal: Soft.  There is no tenderness. There is no rebound, guarding, or CVAT.   Uterus: Gravid, no fundal tenderness. EFW 8#10 oz  Genitourinary: There is no rash, tenderness or lesion.    Musculoskeletal: Normal range of motion  Neurological: She is alert and oriented to person, place, and time. She displays normal reflexes.          Labs:     Prenatal Care Labs:  Lab Results   Component Value Date    HEPBSAG Negative 02/09/2015    GBS Negative 07/19/2015    RPR Nonreactive 02/09/2015    RUBELLAABIGG Immune 02/09/2015     Recent Labs:   Recent Labs  Lab 08/01/15  0945   WBC 9.4   HEMOGLOBIN 12.2   PLT CT 283     Assessment/Plan:   1. Will admit for Induction, labor management and delivery.   2. Fetal status is reassuring.  3. GBS status:negative  4. Analgesia/Anesthesia as needed in active labor  5. AROM as needed  6. Anticipate NSVD    Neysa Hotter, CNM

## 2015-08-05 NOTE — Progress Notes (Signed)
Cervidil still not received from pharmacy, phone call made to tech, will be sending medication up.  Lynnae Sandhoff [place]

## 2015-08-05 NOTE — Progress Notes (Signed)
Subjective:   Patient has received 2 doses of Cytotec with regular mild ctx tracing. She is now 1.5cm and mid position. Cx is very firm and high.     Vital Signs:     Temp:  [98.2 F (36.8 C)-98.8 F (37.1 C)] 98.2 F (36.8 C)  Heart Rate:  [93-122] 100  Resp Rate:  [20-22] 22  BP: (128-158)/(73-79) 158/75 mmHg    Physical Exam:   Physical Exam   Constitutional: She is oriented to person, place, and time. NAD  Cervix:Dilation: 1  Effacement (%): 70  Method: Manual  OB Examiner: Lasheika Ortloff   FHT: Baseline Rate: 125 BPM  Ctx: Q 2-3 min  Regular and Mild    Plan:   Will stop Cytotec this pm  Regular diet   Will place cervidil for further ripening and rest this HS  Probably restart Cytotec tomorrow pending results of Cervidil  Dr. Yaakov Guthrie is  Aware of patient status and agrees with management plan    Signed by: Neysa Hotter, CNM

## 2015-08-05 NOTE — Progress Notes (Signed)
Pt is trying to sleep on her side, unable to trace FHR in this position. Monica monitor placed. Pt states that she is resting comfortably, denies needing any Ambien at this time, but is aware that she can call out if she wants any. Pt has callbell in reach and SO is at bedside.   Lynnae Sandhoff [place]

## 2015-08-05 NOTE — Plan of Care (Signed)
Problem: Labor & Delivery  Goal: Demonstrate collaboration on labor plan.  Outcome: Progressing  POC for Cervidil discussed with pt. Pt verbalizes understanding of POC and voices no concerns.

## 2015-08-05 NOTE — Progress Notes (Signed)
Bedside report done with dayshift RN, [place] [facility]. Pt is sitting in chair next to bed, states that she is resting comfortably. POC for Cervidil discussed and pt is agreeable to medication and POC for the night. Pt voices no needs at this time. Pt has callbell in reach.  Lynnae Sandhoff [place]

## 2015-08-06 ENCOUNTER — Inpatient Hospital Stay: Payer: Medicaid Other | Admitting: Anesthesiology

## 2015-08-06 LAB — COMPREHENSIVE METABOLIC PANEL
ALT: 10 U/L (ref 0–55)
AST (SGOT): 19 U/L (ref 10–42)
Albumin/Globulin Ratio: 0.63 Ratio — ABNORMAL LOW (ref 0.70–1.50)
Albumin: 2.5 gm/dL — ABNORMAL LOW (ref 3.5–5.0)
Alkaline Phosphatase: 209 U/L — ABNORMAL HIGH (ref 40–145)
Anion Gap: 14.7 mMol/L (ref 7.0–18.0)
BUN / Creatinine Ratio: 9.8 Ratio — ABNORMAL LOW (ref 10.0–30.0)
BUN: 6 mg/dL — ABNORMAL LOW (ref 7–22)
Bilirubin, Total: 0.2 mg/dL (ref 0.1–1.2)
CO2: 18.5 mMol/L — ABNORMAL LOW (ref 20.0–30.0)
Calcium: 9.3 mg/dL (ref 8.5–10.5)
Chloride: 109 mMol/L (ref 98–110)
Creatinine: 0.61 mg/dL (ref 0.60–1.20)
EGFR: >60 mL/min/{1.73_m2}
Globulin: 4 gm/dL (ref 2.0–4.0)
Glucose: 90 mg/dL (ref 70–99)
Osmolality Calc: 273 mOsm/kg — ABNORMAL LOW (ref 275–300)
Potassium: 4.2 mMol/L (ref 3.5–5.3)
Protein, Total: 6.5 gm/dL (ref 6.0–8.3)
Sodium: 138 mMol/L (ref 136–147)

## 2015-08-06 MED ORDER — EPHEDRINE SULFATE 50 MG/ML IJ SOLN
10.0000 mg | INTRAMUSCULAR | Status: DC | PRN
Start: 2015-08-06 — End: 2015-08-07
  Administered 2015-08-06 (×3): 10 mg via INTRAVENOUS
  Filled 2015-08-06: qty 1

## 2015-08-06 MED ORDER — VH OXYTOCIN INFUSION 20 UNITS/1000 ML NS (LABOR)
2.0000 m[IU]/min | INTRAVENOUS | Status: DC
Start: 2015-08-06 — End: 2015-08-10
  Administered 2015-08-06: 2 m[IU]/min via INTRAVENOUS
  Filled 2015-08-06: qty 1000

## 2015-08-06 MED ORDER — NALOXONE HCL 0.4 MG/ML IJ SOLN
0.1000 mg | INTRAMUSCULAR | Status: DC | PRN
Start: 2015-08-06 — End: 2015-08-07

## 2015-08-06 MED ORDER — VH BUPIVACAINE 0.125% FENTANYL 2 MCG/ML BAG (PCEA)
EPIDURAL | Status: DC
Start: 2015-08-06 — End: 2015-08-07
  Filled 2015-08-06: qty 150

## 2015-08-06 MED ORDER — VH BUPIVACAINE 0.125% FENTANYL 2 MCG/ML BOLUS
10.0000 mL | EPIDURAL | Status: DC | PRN
Start: 2015-08-06 — End: 2015-08-07
  Filled 2015-08-06 (×5): qty 10

## 2015-08-06 MED ORDER — MISOPROSTOL 100 MCG PO TABS
ORAL_TABLET | ORAL | Status: AC
Start: 2015-08-06 — End: 2015-08-06
  Administered 2015-08-06: 50 ug via ORAL
  Filled 2015-08-06: qty 1

## 2015-08-06 MED ORDER — MISOPROSTOL 100 MCG PO TABS
50.0000 ug | ORAL_TABLET | Freq: Once | ORAL | Status: AC | PRN
Start: 2015-08-06 — End: 2015-08-06

## 2015-08-06 MED ORDER — MISOPROSTOL 100 MCG PO TABS
50.0000 ug | ORAL_TABLET | Freq: Once | ORAL | Status: AC
Start: 2015-08-06 — End: 2015-08-06
  Administered 2015-08-06: 50 ug via ORAL
  Filled 2015-08-06: qty 1

## 2015-08-06 MED ORDER — BUPIVACAINE HCL (PF) 0.25 % IJ SOLN
INTRAMUSCULAR | Status: AC
Start: 2015-08-06 — End: 2015-08-06
  Administered 2015-08-06: 8 mL via EPIDURAL
  Filled 2015-08-06: qty 30

## 2015-08-06 MED ORDER — VH BUPIVACAINE 0.125% FENTANYL 2 MCG/ML BAG (PCEA)
EPIDURAL | Status: AC
Start: 2015-08-06 — End: 2015-08-06
  Administered 2015-08-06: 150 mL via EPIDURAL
  Filled 2015-08-06: qty 150

## 2015-08-06 NOTE — Progress Notes (Signed)
Subjective:   Patient is comfortable s/p epidural placement. She experienced an episode of fetal bradycardia due to low BP 80/50's and received Ephedrine and Pitocin was stopped for fetal recovery. No cervical change has been noted since earlier this pm.     Vital Signs:     Temp:  [97.5 F (36.4 C)-98.1 F (36.7 C)] 98.1 F (36.7 C)  Heart Rate:  [76-129] 95  Resp Rate:  [18-20] 20  BP: (89-185)/(53-100) 114/62 mmHg    Physical Exam:   Physical Exam   Constitutional: She is oriented to person, place, and time. NAD  Cervix:Dilation: 4  Effacement (%): 70  Cervical Characteristics: Soft  Station: -2  Presentation: Vertex  Method: Manual  OB Examiner: Bane Hagy   FHT: Baseline Rate: 135 BPM  Ctx: Q 3 mins  Regular and Mild    Plan:   Will restart Pitocin as needed pending fetal recovery  Monitor maternal BP and progress of labor closely  Consider placement of IUPC/ISE as needed     Signed by: Neysa Hotter, CNM

## 2015-08-06 NOTE — Anesthesia Preprocedure Evaluation (Signed)
Anesthesia Evaluation    AIRWAY    Mallampati: III    TM distance: >3 FB  Neck ROM: full  Mouth Opening:full   CARDIOVASCULAR    cardiovascular exam normal       DENTAL    no notable dental hx     PULMONARY    pulmonary exam normal     OTHER FINDINGS                      Anesthesia Plan    ASA 3     epidural                                 informed consent obtained      pertinent labs reviewed

## 2015-08-06 NOTE — Progress Notes (Signed)
Subjective:   Patient reports that contractions are stronger s/p 2 doses of Cytotec. She is now 4 cm/75%. Will start Pitocin at this time. BP has been stable with occasional elevations. She otherwise denies any c/o.    Vital Signs:     Temp:  [97.5 F (36.4 C)-98 F (36.7 C)] 98 F (36.7 C)  Heart Rate:  [76-107] 85  Resp Rate:  [18-20] 20  BP: (131-159)/(72-96) 155/93 mmHg    Physical Exam:   Physical Exam   Constitutional: She is oriented to person, place, and time. NAD.   Cervix:Dilation: 3  Effacement (%): 70  Cervical Characteristics: Mid-position  Station: -2  Presentation: Vertex  Method: Manual  OB Examiner: Kristie Bracewell   FHT: Baseline Rate: 130 BPM  Ctx: Q 2-5 minutes  Irregular and Moderate    Plan:   Start Pitocin  Monitor maternal and fetal tolerance of labor  Analgesia and AROM prn.    Signed by: Neysa Hotter, CNM

## 2015-08-06 NOTE — Progress Notes (Signed)
BP is back to 123/68 after third dose of Ephedrine  Late decelerations have abated at this time.  Contractions have spaced out significantly without Pitocin  Dr. Orson Eva coming in to evaluate status

## 2015-08-06 NOTE — Anesthesia Procedure Notes (Signed)
Epidural  Patient location during procedure: L&D  Reason for block: Labor or C-section  Block at Surgeon's request: Yes    Start time: 08/06/2015 10:02 PM  End time: 08/06/2015 10:15 PM    Staffing  Anesthesiologist: Rudean Curt C    Pre-procedure Checklist   Completed: patient identified, site marked, surgical consent, pre-op evaluation, timeout performed, risks and benefits discussed, monitors and equipment checked, anesthesia consent given and correct site  Timeout Completed:  08/06/2015 10:02 PM    Epidural  Patient monitoring: Pulse oximetry and NIBP    Premedication: No and Meaningful Contact Maintained  Patient position: sitting    Skin Local: lidocaine 1%    Attempts  Number of attempts: 1                    Successful attempt  Interspace: L2-3      Needle type: Touhy needle   Needle gauge: 17  Injection technique: LOR saline  Epidural Space ID: 9 cm  CSF Return: No   Blood Return: No  Paresthesia Pain: no    Needle Placement  Needle type: Touhy needle   Needle gauge: 17  Injection technique: LOR saline  CSF Return: No  Blood Return: No          Paresthesia Pain: no    Catheter Placement   Catheter type: end hole  Catheter size: 19 G  Catheter at skin depth: 15 cm  CSF Return: No  Blood Return: No  Test Dose:1.5 % lidocaine  3 cc  Slow fractionated injection: no  Injection made incrementally with aspirations every 3 mL.    No Catheter IV/SA Signs or Symptoms    Assessment   Block Outcome: patient tolerated procedure well, no complications and successful block

## 2015-08-06 NOTE — Progress Notes (Signed)
Several late decelerations followed the fetal bradycardia.   BP remains 110/60-70's s/p epidural  IUPC was attempted with great difficulty an first attempt was aborted.  Plan to call Dr. Orson Eva regarding lack of progress and fetal status.

## 2015-08-06 NOTE — Plan of Care (Signed)
Problem: Labor & Delivery  Goal: Demonstrate collaboration on labor plan.  Outcome: Progressing  Neysa Hotter, CNM at bedside.  SVE done.  Plan of care discussed- place 1 dose of cytotec and then in 3 hours start pitocin iv.  Pt agrees with plan of care.

## 2015-08-06 NOTE — Progress Notes (Signed)
 Subjective:   Patient rested well last pm. Cervidil was removed and she has been up for am care, and eaten breakfast. She is now 3 cm, more anterior and lower in the pelvis. BP is borderline but stable. She denies any c/o.    Vital Signs:     Temp:  [97.8 F (36.6 C)-98.2 F (36.8 C)] 98 F (36.7 C)  Heart Rate:  [90-100] 94  Resp Rate:  [18-22] 20  BP: (131-159)/(72-96) 146/90 mmHg    Physical Exam:   Physical Exam   Constitutional: She is oriented to person, place, and time. NAD  Cervix:Dilation: 3  Effacement (%): 70  Cervical Characteristics: Mid-position  Station: -2  Presentation: Vertex  Method: Manual  OB Examiner: Firman Petrow   FHT: Baseline Rate: 130 BPM  Ctx: Q 3-5 min  Irregular and Mild    Plan:   Will restart Induction with Cytotec  Consider Pitocin/AROM prn  Analgesia as needed  Anticipate NSVD    Signed by: Neysa Hotter, CNM

## 2015-08-07 ENCOUNTER — Inpatient Hospital Stay: Payer: Medicaid Other | Admitting: Anesthesiology

## 2015-08-07 ENCOUNTER — Encounter
Admission: RE | Disposition: A | Discharge status: Home / Self Care | Payer: Self-pay | Source: Home / Self Care | Attending: Obstetrics & Gynecology

## 2015-08-07 DIAGNOSIS — Z98891 History of uterine scar from previous surgery: Secondary | ICD-10-CM | POA: Diagnosis not present

## 2015-08-07 LAB — CBC AND DIFFERENTIAL
Basophils %: 0 % (ref 0.0–3.0)
Basophils Absolute: 0 10*3/uL (ref 0.0–0.3)
Eosinophils %: 0.2 % (ref 0.0–7.0)
Eosinophils Absolute: 0 10*3/uL (ref 0.0–0.8)
Hematocrit: 24.7 % — ABNORMAL LOW (ref 36.0–48.0)
Hemoglobin: 8.1 gm/dL — ABNORMAL LOW (ref 12.0–16.0)
Lymphocytes Absolute: 0.6 10*3/uL (ref 0.6–5.1)
Lymphocytes: 4.3 % — ABNORMAL LOW (ref 15.0–46.0)
MCH: 26 pg — ABNORMAL LOW (ref 28–35)
MCHC: 33 gm/dL (ref 32–36)
MCV: 80 fL (ref 80–100)
MPV: 8.6 fL (ref 6.0–10.0)
Monocytes Absolute: 1.2 10*3/uL (ref 0.1–1.7)
Monocytes: 7.8 % (ref 3.0–15.0)
Neutrophils %: 87.7 % — ABNORMAL HIGH (ref 42.0–78.0)
Neutrophils Absolute: 13 10*3/uL — ABNORMAL HIGH (ref 1.7–8.6)
PLT CT: 201 10*3/uL (ref 130–440)
RBC: 3.09 10*6/uL — ABNORMAL LOW (ref 3.80–5.00)
RDW: 16 % — ABNORMAL HIGH (ref 11.0–14.0)
WBC: 14.8 10*3/uL — ABNORMAL HIGH (ref 4.0–11.0)

## 2015-08-07 SURGERY — Surgical Case
Anesthesia: Regional | Wound class: Clean Contaminated

## 2015-08-07 MED ORDER — KETOROLAC TROMETHAMINE 30 MG/ML IJ SOLN
INTRAMUSCULAR | Status: DC | PRN
Start: 2015-08-07 — End: 2015-08-07
  Administered 2015-08-07: 30 mg via INTRAVENOUS

## 2015-08-07 MED ORDER — KETOROLAC TROMETHAMINE 30 MG/ML IJ SOLN
INTRAMUSCULAR | Status: AC
Start: 2015-08-07 — End: ?
  Filled 2015-08-07: qty 1

## 2015-08-07 MED ORDER — VALLEY PROMETHAZINE 50 MG/0.4 ML TOPICAL GEL UD (RPKG)
12.5000 mg | Freq: Once | TOPICAL | Status: DC
Start: 2015-08-07 — End: 2015-08-07
  Filled 2015-08-07: qty 0.1

## 2015-08-07 MED ORDER — LACTATED RINGERS IV SOLN
125.0000 mL/h | INTRAVENOUS | Status: AC
Start: 2015-08-07 — End: 2015-08-08
  Administered 2015-08-07 – 2015-08-08 (×3): 125 mL/h via INTRAVENOUS

## 2015-08-07 MED ORDER — ONDANSETRON HCL 4 MG/2ML IJ SOLN
4.0000 mg | Freq: Once | INTRAMUSCULAR | Status: DC | PRN
Start: 2015-08-07 — End: 2015-08-07

## 2015-08-07 MED ORDER — LIDOCAINE-EPINEPHRINE 2 %-1:200000 IJ SOLN
INTRAMUSCULAR | Status: DC | PRN
Start: 2015-08-07 — End: 2015-08-07
  Administered 2015-08-07 (×2): 5 mL via EPIDURAL

## 2015-08-07 MED ORDER — VH OXYTOCIN INFUSION 20 UNITS/1000 ML NS (POST PARTUM)
7.5000 [IU]/h | INTRAVENOUS | Status: AC
Start: 2015-08-07 — End: 2015-08-07
  Administered 2015-08-07: 7.5 [IU]/h via INTRAVENOUS
  Filled 2015-08-07: qty 1000

## 2015-08-07 MED ORDER — DOCUSATE SODIUM 100 MG PO CAPS
200.0000 mg | ORAL_CAPSULE | Freq: Two times a day (BID) | ORAL | Status: DC | PRN
Start: 2015-08-07 — End: 2015-08-10
  Administered 2015-08-08 – 2015-08-09 (×2): 200 mg via ORAL
  Filled 2015-08-07 (×2): qty 2

## 2015-08-07 MED ORDER — VH HYDROMORPHONE HCL PF 1 MG/ML CARPUJECT
1.0000 mg | Freq: Once | INTRAMUSCULAR | Status: AC
Start: 2015-08-07 — End: 2015-08-07
  Administered 2015-08-07: 1 mg via INTRAVENOUS
  Filled 2015-08-07: qty 1

## 2015-08-07 MED ORDER — LACTATED RINGERS IV SOLN
INTRAVENOUS | Status: DC
Start: 2015-08-07 — End: 2015-08-10

## 2015-08-07 MED ORDER — VH HYDROMORPHONE HCL PF 1 MG/ML CARPUJECT
0.5000 mg | INTRAMUSCULAR | Status: DC | PRN
Start: 2015-08-07 — End: 2015-08-07

## 2015-08-07 MED ORDER — PRENATAL 27-0.8 MG PO TABS
1.0000 | ORAL_TABLET | Freq: Every day | ORAL | Status: DC
Start: 2015-08-07 — End: 2015-08-10
  Administered 2015-08-08 – 2015-08-10 (×2): 1 via ORAL
  Filled 2015-08-07 (×2): qty 1

## 2015-08-07 MED ORDER — ONDANSETRON HCL 4 MG/2ML IJ SOLN
4.0000 mg | Freq: Once | INTRAMUSCULAR | Status: AC | PRN
Start: 2015-08-07 — End: 2015-08-07
  Administered 2015-08-07: 4 mg via INTRAVENOUS

## 2015-08-07 MED ORDER — KETOROLAC TROMETHAMINE 30 MG/ML IJ SOLN
30.0000 mg | Freq: Four times a day (QID) | INTRAMUSCULAR | Status: AC | PRN
Start: 2015-08-07 — End: 2015-08-08
  Administered 2015-08-07: 30 mg via INTRAVENOUS
  Filled 2015-08-07: qty 1

## 2015-08-07 MED ORDER — OXYTOCIN 10 UNIT/ML IJ SOLN
INTRAMUSCULAR | Status: AC
Start: 2015-08-07 — End: ?
  Filled 2015-08-07: qty 2

## 2015-08-07 MED ORDER — LACTATED RINGERS IV SOLN
INTRAVENOUS | Status: DC
Start: 2015-08-07 — End: 2015-08-07

## 2015-08-07 MED ORDER — OXYTOCIN 10 UNIT/ML IJ SOLN
INTRAMUSCULAR | Status: DC | PRN
Start: 2015-08-07 — End: 2015-08-07
  Administered 2015-08-07 (×2): 20 [IU] via INTRAVENOUS

## 2015-08-07 MED ORDER — NALBUPHINE HCL 10 MG/ML IJ SOLN
2.5000 mg | INTRAMUSCULAR | Status: DC | PRN
Start: 2015-08-07 — End: 2015-08-10
  Filled 2015-08-07: qty 1

## 2015-08-07 MED ORDER — LACTATED RINGERS IV SOLN
INTRAVENOUS | Status: DC | PRN
Start: 2015-08-07 — End: 2015-08-07

## 2015-08-07 MED ORDER — DIPHENHYDRAMINE HCL 50 MG/ML IJ SOLN
25.0000 mg | INTRAMUSCULAR | Status: DC | PRN
Start: 2015-08-07 — End: 2015-08-10

## 2015-08-07 MED ORDER — VH PHENYLEPHRINE 40 MCG/ML IV BOLUS (ANESTHESIA)
PREFILLED_SYRINGE | INTRAVENOUS | Status: AC
Start: 2015-08-07 — End: ?
  Filled 2015-08-07: qty 20

## 2015-08-07 MED ORDER — MORPHINE SULFATE (PF) 0.5 MG/ML IJ SOLN
INTRAMUSCULAR | Status: AC
Start: 2015-08-07 — End: ?
  Filled 2015-08-07: qty 10

## 2015-08-07 MED ORDER — ONDANSETRON HCL 4 MG/2ML IJ SOLN
4.0000 mg | Freq: Once | INTRAMUSCULAR | Status: DC | PRN
Start: 2015-08-07 — End: 2015-08-10
  Filled 2015-08-07: qty 2

## 2015-08-07 MED ORDER — VH OXYTOCIN INFUSION 20 UNITS/1000 ML NS (LABOR)
INTRAVENOUS | Status: AC
Start: 2015-08-07 — End: 2015-08-07
  Filled 2015-08-07: qty 1000

## 2015-08-07 MED ORDER — VH CEFAZOLIN 3 G IN NS 100 ML IVPB (SIMPLE)
3.0000 g | Freq: Once | INTRAVENOUS | Status: DC
Start: 2015-08-07 — End: 2015-08-07
  Filled 2015-08-07: qty 100

## 2015-08-07 MED ORDER — SODIUM CHLORIDE 0.9 % IJ SOLN
3.0000 mL | Freq: Three times a day (TID) | INTRAMUSCULAR | Status: DC
Start: 2015-08-08 — End: 2015-08-09

## 2015-08-07 MED ORDER — MEASLES, MUMPS & RUBELLA VAC SC INJ
0.5000 mL | INJECTION | SUBCUTANEOUS | Status: DC | PRN
Start: 2015-08-07 — End: 2015-08-10

## 2015-08-07 MED ORDER — LACTATED RINGERS IV BOLUS
1000.0000 mL | Freq: Once | INTRAVENOUS | Status: DC
Start: 2015-08-07 — End: 2015-08-07

## 2015-08-07 MED ORDER — BISACODYL 10 MG RE SUPP
10.0000 mg | Freq: Every day | RECTAL | Status: DC | PRN
Start: 2015-08-07 — End: 2015-08-10

## 2015-08-07 MED ORDER — MISOPROSTOL 200 MCG PO TABS
800.0000 ug | ORAL_TABLET | Freq: Once | ORAL | Status: AC
Start: 2015-08-07 — End: 2015-08-07
  Administered 2015-08-07: 800 ug via RECTAL
  Filled 2015-08-07: qty 4

## 2015-08-07 MED ORDER — FENTANYL CITRATE (PF) 50 MCG/ML IJ SOLN (WRAP)
25.0000 ug | INTRAMUSCULAR | Status: DC | PRN
Start: 2015-08-07 — End: 2015-08-07

## 2015-08-07 MED ORDER — MORPHINE SULFATE (PF) 0.5 MG/ML IJ SOLN
INTRAMUSCULAR | Status: DC | PRN
Start: 2015-08-07 — End: 2015-08-07
  Administered 2015-08-07: 2.5 mg via INTRATHECAL

## 2015-08-07 MED ORDER — VH CEFAZOLIN 3 G IN NS 100 ML IVPB (SIMPLE)
3.0000 g | Freq: Once | INTRAVENOUS | Status: AC
Start: 2015-08-07 — End: 2015-08-07
  Administered 2015-08-07: 3 g via INTRAVENOUS

## 2015-08-07 MED ORDER — SODIUM CHLORIDE 0.9 % IJ SOLN
0.1000 mg | INTRAMUSCULAR | Status: DC | PRN
Start: 2015-08-07 — End: 2015-08-10

## 2015-08-07 MED ORDER — TETANUS-DIPHTH-ACELL PERTUSSIS 5-2.5-18.5 LF-MCG/0.5 IM SUSP
0.5000 mL | INTRAMUSCULAR | Status: DC | PRN
Start: 2015-08-07 — End: 2015-08-10

## 2015-08-07 MED ORDER — IBUPROFEN 600 MG PO TABS
600.0000 mg | ORAL_TABLET | Freq: Four times a day (QID) | ORAL | Status: DC
Start: 2015-08-07 — End: 2015-08-10
  Administered 2015-08-08 – 2015-08-10 (×6): 600 mg via ORAL
  Filled 2015-08-07 (×6): qty 1

## 2015-08-07 MED ORDER — SIMETHICONE 80 MG PO CHEW
160.0000 mg | CHEWABLE_TABLET | Freq: Three times a day (TID) | ORAL | Status: DC | PRN
Start: 2015-08-07 — End: 2015-08-10

## 2015-08-07 MED ORDER — OXYCODONE-ACETAMINOPHEN 5-325 MG PO TABS
1.0000 | ORAL_TABLET | ORAL | Status: DC | PRN
Start: 2015-08-07 — End: 2015-08-10
  Administered 2015-08-08 – 2015-08-09 (×3): 1 via ORAL
  Filled 2015-08-07 (×3): qty 1

## 2015-08-07 MED ORDER — LIDOCAINE-EPINEPHRINE 2 %-1:200000 IJ SOLN
INTRAMUSCULAR | Status: AC
Start: 2015-08-07 — End: ?
  Filled 2015-08-07: qty 20

## 2015-08-07 SURGICAL SUPPLY — 23 items
BANDAID EXTRA LARGE (Dressings) ×2 IMPLANT
BLADE CLIPPER (Supply) ×2 IMPLANT
CHLORAPREP W/ORANGE TINT 26ML (Supply) IMPLANT
DRAPE IOBAN 6640EZ (Supply) ×2 IMPLANT
GLOVE BIOGEL UND PI IND SZ 6.5 (Supply) ×2 IMPLANT
GLOVE BIOGEL UT M PI SURG SZ 6 (Supply) ×2 IMPLANT
KIT ARTERIAL BLOOD GAS SAMPLE (Supply) IMPLANT
MARKER SKIN RULER #130 (Supply) ×2 IMPLANT
PACK C-SECTION (Supply) ×2 IMPLANT
RESUSITATOR NEONATAL (Supply) IMPLANT
SET CYSTO IRRIG URO V4608-20 (Supply) IMPLANT
SLEEVE SCD KNEE #5329 (Supply) ×2 IMPLANT
SOL SALINE IRRIG 500ML (Solutions) ×4 IMPLANT
STERISTRIP 1/2 IN X 4 IN (Supply) ×2 IMPLANT
SUT CHROMIC GUT 1 915H (Supply) ×4 IMPLANT
SUT PDS II 1 Z880G (Supply) IMPLANT
SUT VICRYL 0 J358H (Supply) ×2 IMPLANT
SUT VICRYL 2-0 J357H (Supply) ×2 IMPLANT
SUT VICRYL PLUS 3-0 VCP416H (Supply) ×2 IMPLANT
SWABSTICK BENZOIN (Supply) ×2 IMPLANT
TRAY URINEMETER FOLEY LATEXFRE (TDC (Tubes, Draines, Catheters)) ×1
TRAY URINEMETER FOLEY LATEXFRE (TDC (Tubes, Drains, Catheters)) ×1 IMPLANT
VACUUM MITYVAC MUSHROOM CUP (Supply) IMPLANT

## 2015-08-07 NOTE — Progress Notes (Signed)
Called to room by RN for increased bleeding and clots. Pt had vomited and stated she felt warm in the perineum area. Pt had bled through her pad and two blue chuck pads which had been clean 30 minutes prior. Fundus up 1.  Fundal massage performed and several more tennis ball sized clots were expelled. Bleeding slowed and fundus firm and at umbilicus. Neysa Hotter CNM called and notified. Will continue to monitor and update CNM.

## 2015-08-07 NOTE — Progress Notes (Signed)
INTRAPARTUM progress note      Subjective:     Patient doing well. She is has gotten epidural. She is comfortable after the epidural. She denies headaches. She has had late decelerations after the epidural with low BP treated 2-3 times with ephedrin.    Objective:     Temp Readings from Last 3 Encounters:   08/06/15 98.1 F (36.7 C) Oral   08/01/15 98.6 F (37 C) Oral   04/07/15 98.2 F (36.8 C)      BP Readings from Last 3 Encounters:   08/07/15 126/69   08/01/15 146/77   04/07/15 130/70     Pulse Readings from Last 3 Encounters:   08/07/15 90   08/01/15 81   04/07/15 120        Physical Exam:   NST: Cat II  Toco:Contractions present:  Every 3-5 minutes  Cerival exam: 4 cm, 80%,soft, -3 stationVertex  Very anterior.  AROM done with lots of clear fluid.  IUPC and FSE placed.           Assessment/Plan:     Impression: IUP at  [redacted]w[redacted]d induction second day due to mild preeclampsia.  Late decels after epidural with hypotension, improving                     Plan: Internal monitors placed and AROm done with clear fluid.  NST much better now.  Will continue to observe closely and then start pitocin again.  Pt informed about the slow progression and tracing and the increased risk for c/s      Levander Campion, MD  08/07/2015 12:11 AM

## 2015-08-07 NOTE — Progress Notes (Signed)
Postoperative Day 0 C-section      Subjective:     Patient doing well at this time. She had episodes of passing large clots (via fundal massage) that necessitated use or Misoprostol 800 mcg to arrest. Bleeding now is controlled. She is tolerating clear diet. Pain controlled. Complains of nothing. Patient is breastfeeding. Patient notes her bleeding to be moderate.    Objective:     Temp Readings from Last 1 Encounters:   08/07/15 97.7 F (36.5 C) Oral     BP Readings from Last 1 Encounters:   08/07/15 158/98     Pulse Readings from Last 1 Encounters:   08/07/15 111        Physical Exam:   Breasts: breasts appear normal, no suspicious masses, no skin or nipple changes or axillary nodes.  CVS exam: normal rate, regular rhythm, normal S1, S2, no murmurs, rubs, clicks or gallops.  Respiratory: Effortless, clear bilaterally A&P  Fundus--firm NT at   Incision--clean, dry, and intact  Ext--calves non tender, 1+ pedal edema    Lab Results   Component Value Date    HGB 12.5 08/05/2015    HGB 12.2 08/01/2015    HCT 37.6 08/05/2015    HCT 36.3 08/01/2015        Assessment/Plan:     Impression:    1. Stable POD 0 s/p primary low transverse cesarean section.   2. Doing well.    Plan:  1. Routine post-op care  2. Anticipate D/C home POD#3 or #4  3. Birth control: undecided    Neysa Hotter, [place]  08/07/2015 7:35 AM

## 2015-08-07 NOTE — Anesthesia Preprocedure Evaluation (Signed)
Anesthesia Evaluation    AIRWAY    Mallampati: III    TM distance: >3 FB  Neck ROM: full  Mouth Opening:full   CARDIOVASCULAR           DENTAL    no notable dental hx     PULMONARY    pulmonary exam normal     OTHER FINDINGS                      Anesthesia Plan    ASA 3     epidural               (Epidural has been working.  Baby not tolerating labor.)                  informed consent obtained      pertinent labs reviewed

## 2015-08-07 NOTE — Lactation Note (Signed)
Dyad consulted. NB is less than 24 hours old. Mother reports baby has been spitting up fluid.  Sleepy with intermittent interest in feeding. Encouraged skin to skin and gave education to mother verbally r/t the normals of breastfeeding. Educated regarding cue-based feeding in first 24 hours of life, then regular feedings every 2-3 hours in second 24 hours of life. I reviewed cues and basics of positioning. Gave support and encouragement. Mother verbalized understanding of all teachings, gave parameters to indicate whether the baby was "getting enough" with use of breastfeeding diary.    Mother has my contact phone number for assistance in hospital, and "warm line" phone number for follow up if needed. Faith Rogue RN BSN, IBCLC

## 2015-08-07 NOTE — Lactation Note (Signed)
This note was copied from the chart of  Kristi Branch.  Assisting, with latching infant, infant improving, , will monitor

## 2015-08-07 NOTE — Anesthesia Postprocedure Evaluation (Signed)
Anesthesia Post Evaluation    Patient: Kristi Branch    Procedures performed: Procedure(s):  CESAREAN SECTION    Anesthesia type: Epidural    Patient location:Phase I PACU    Last vitals:   Filed Vitals:    08/07/15 0242   BP: 114/59   Pulse: 98   Temp: 36.1 C (97 F)   Resp:    SpO2: 99%       Post pain: Pain controlled by preop block     Mental Status:awake    Respiratory Function: tolerating room air    Cardiovascular: stable    Nausea/Vomiting: patient not complaining of nausea or vomiting    Hydration Status: adequate    Post assessment: no apparent anesthetic complications

## 2015-08-07 NOTE — Consults (Signed)
Followed up, will consult patient tomorrow.     Mother knows to call LCs if she encounters problems at home and she can return to LCs as an OP, or we can speak on the phone.  Mother has our contact phone numbers for assistance in hospital, and "warm line" phone number for follow up if needed.  Faith Rogue RN BSN, IBCLC

## 2015-08-07 NOTE — UM Notes (Signed)
[facility] Utilization Management Review Sheet    NAME: Nour Rodrigues  MR#: [id number]    CSN#: [id number]    ROOM: 222/222-A AGE: 24 y.o.    ADMIT DATE AND TIME: 08/05/2015  7:45 AM      PATIENT CLASS:  Inpatient    ATTENDING PHYSICIAN: Jacques Earthly, MD  PAYOR:Payor: MEDICAID HMO / Plan: MEDICAID [place] [employer] / Product Type: *No Product type* /       AUTH #:     DIAGNOSIS:      HISTORY:   Past Medical History   Diagnosis Date   . Obesity    . 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 Treated in ED.   Marland Kitchen Anemia        DATE OF REVIEW: 08/07/2015    VITALS: BP 125/50 mmHg  Pulse 98  Temp(Src) 98.2 F (36.8 C) (Oral)  Resp 17  Ht 1.6 m (5\' 3" )  Wt 132.45 kg (292 lb)  BMI 51.74 kg/m2  SpO2 99%  LMP 11/07/2014  Breastfeeding? Yes    08/05/15:  Admitted for IOL due to [facility] and proteinuria.  G1P1  EDC:  [id number]  C/S on 101016 @ 1:51 am  Female 9 lbs 0.1 oz 4085 gms  Apgar:  8/9  Gestation:  39 wks 0 days  C/S for fetal intolerance to labor.      Rush Barer Emmilynn Marut, RN   Utilization Review Nurse  Care Management   [facility]  [address]  [place] [address]  Ph: [phone]  F:  [phone]  [email or web address]

## 2015-08-07 NOTE — Progress Notes (Signed)
Upon reassessment fundus displaced left and up 3. Massaged fundus and expelled another tennis ball sized clot. Pt had bled through two pink pads. Urinary catheter draining. After massage fundus firm and up 1. CNM called. Cytotec ordered.

## 2015-08-07 NOTE — Op Note (Signed)
Cesarean Section Procedure Note    Pre-operative Diagnosis: [redacted]w[redacted]d, fetal intolerance to labor  Post-operative Diagnosis: same    Procedure: lower uterine transverse cesarean section    Surgeon: Levander Campion MD    Assistant: Tylene Fantasia, CST    Findings:  A viable female  infant with APGARs of 8  and 9  at 1 and 5 minutes respectively.  Weight 9 lb 0.1 oz (4085 g) .  Normal fallopian tubes and ovaries were seen bilaterally.    Estimated Blood Loss:  500 ML           Specimens:   None           Complications:  None; patient tolerated the procedure well.      Anesthesia: Epidural anesthesia    ASA Class: 2    Procedure Details   The risks, benefits, complications, treatment options, and expected outcomes were discussed with the patient.  The patient concurred with the proposed plan, giving informed consent.  The site of surgery properly noted. The patient was taken to Operating Room, identified as Graceanne Guin and the procedure verified as C-Section Delivery. A Time Out was held and the above information confirmed.    After induction of anesthesia, the patient was draped and prepped in the usual sterile manner. Silk tape was used to tape up the pannus for better exposure. A Pfannenstiel incision was made with the knife and carried down through the subcutaneous tissue to the fascia using the Bovie. Fascial incision was made and extended transversely. The fascia was separated from the underlying rectus tissue superiorly and inferiorly. The peritoneum was identified and entered bluntly. Peritoneal incision was extended digitally in a longitudinal fashion. The bladder blade was inserted. The utero-vesical peritoneal reflection was incised transversely and the bladder flap was freed digitally from the lower uterine segment.  The bladder blade was reinserted.  A lower uterine transverse incision  was made. The bladder blade was removed and the infant was delivered atraumatically from a vertex position. After the  umbilical cord was clamped and cut, cord blood was obtained for evaluation. The placenta was delivered by manual removal. It was intact and appeared normal.  The uterus was exteriorized and cleared of all clots and debris using a laparotomy sponge.  Uterus, tubes and ovaries appeared normal. The uterine incision was closed with running locked sutures of 0 Chromic. A second layer of the same suture was placed in an imbricating manner. The uterus was carefully returned to the abdominal cavity.  Hemostasis was obtained with the Bovie and observed. The fascia was reapproximated with a running suture of 0 Vicryl.  The subcutaneous tissue did require closure with 2-0 Vicryl. The skin was reapproximated with 3-0 Vicryl on a SH needle.    Instrument, sponge, and needle counts were correct after uterine closure, prior to the abdominal closure, and at the conclusion of the case.    Levander Campion MD 08/07/2015 2:31 AM

## 2015-08-07 NOTE — Transfer of Care (Signed)
Anesthesia Transfer of Care Note    Patient: Kristi Branch    Last vitals:   Filed Vitals:    08/07/15 0242   BP: 114/59   Pulse: 98   Temp: 36.1 C (97 F)   Resp:    SpO2: 99%       Oxygen: Room Air     Mental Status:awake    Airway: Natural    Cardiovascular Status:  stable

## 2015-08-08 LAB — HEMOGLOBIN AND HEMATOCRIT, BLOOD
Hematocrit: 18.8 % — CL (ref 36.0–48.0)
Hemoglobin: 6.2 gm/dL — CL (ref 12.0–16.0)

## 2015-08-08 MED ORDER — FERROUS SULFATE 325 (65 FE) MG PO TABS
325.0000 mg | ORAL_TABLET | Freq: Three times a day (TID) | ORAL | Status: DC
Start: 2015-08-08 — End: 2015-08-10
  Administered 2015-08-08 – 2015-08-10 (×4): 325 mg via ORAL
  Filled 2015-08-08 (×4): qty 1

## 2015-08-08 NOTE — Consults (Signed)
Mother has wide spaced breasts and didn't have change in breast size with pregnancy.  I am concerned she will not have a robust supply of milk for this baby.      Mother is passive in nature and she reports the baby has latched several times with Rn assistance. Baby in circumcision procedure at this time.  Gave normal expectations of the day of circumcision in regards to breastfeeding. Mother verbalized understanding of teachings.    I recognized need to stimulate the breast tissue, hand pump given with instructions.  Her breasts are not filling upon my palpation. Hand expression demonstrated, taught and independently completed, 1 drop expressed on left side.  I instructed the mother to do this as much as possible in next 24 hours.  I requested she call me so I can visualize him latch.     Mother knows to call LCs if she encounters problems at home and she can return to LCs as an OP, or we can speak on the phone.  Mother has our contact phone numbers for assistance in hospital, and "warm line" phone number for follow up if needed.  Faith Rogue RN BSN, IBCLC

## 2015-08-08 NOTE — Progress Notes (Signed)
Postoperative Day 1 C-section      Subjective:     Patient doing well. Complains of nothing, bleeding much lighter. Denies dizziness, SOB, CP, or headache. Patient is breastfeeding. Patient notes her bleeding to be small.    Objective:     Temp Readings from Last 3 Encounters:   08/08/15 99.1 F (37.3 C) Oral   08/01/15 98.6 F (37 C) Oral   04/07/15 98.2 F (36.8 C)      BP Readings from Last 3 Encounters:   08/08/15 115/62   08/01/15 146/77   04/07/15 130/70     Pulse Readings from Last 3 Encounters:   08/08/15 97   08/01/15 81   04/07/15 120        Physical Exam:     Chest--CTA bilaterally  Cardiovascular--regular rhythm, normal rate  Fundus--firm moderately tender at Umbilicus   Incision--clean, dry, and intact  Ext--NT, no cyanois, clubbing or edema  Lochia--decreasing      Lab Results   Component Value Date    HGB 6.2* 08/08/2015    HCT 18.8* 08/08/2015        Assessment/Plan:     Impression:  PPD 1 s/p Primary low transverse cesarean section .  Significant asymptomatic acute blood loss anemia, Doing well.    Plan:  Routine PP care, anticipate D/C home POD 3 or 4      Circumcision desired: Yes        Dava Najjar, MD  08/08/2015 9:39 AM

## 2015-08-08 NOTE — Progress Notes (Signed)
Pt. Resting in bed holding infant, family at side, vss, abd. Pain 5/10, ffc down 1, lochia small, , call bell in reach. Will monitor

## 2015-08-08 NOTE — Consults (Signed)
[facility] PAIN MANAGEMENT PROGRESS NOTE    Kristi Branch is a 24 y.o. female, POD #1 S/P Primary cesarean section under epidural block with morphine for post operative pain management. The patient reports good pain control from the Morphine up to presently.  She denies much pain at rest and with activities now.  She is taking Motrin and Percocet, which are effective.  Pruritus has resolved.  Denies N/V, HA, back pain or leg numbness. She is voiding after foley catheter out. She is able to ambulate well.    Past Medical History   Diagnosis Date   . Obesity    . 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 Treated in ED.   Marland Kitchen Anemia      Past Surgical History   Procedure Laterality Date   . Cesarean section N/A 08/07/2015     Procedure: CESAREAN SECTION;  Surgeon: Levander Campion, MD;  Location: [facility];  Service: Obstetrics;  Laterality: N/A;     Social History   Substance Use Topics   . Smoking status: Never Smoker    . Smokeless tobacco: Never Used   . Alcohol Use: No     Review of patient's allergies indicates no known allergies.    Medication     Current Facility-Administered Medications   Medication Dose Route Frequency   . ferrous sulfate  325 mg Oral TID AC   . ibuprofen  600 mg Oral 4 times per day   . prenatal vitamin  1 tablet Oral Daily   . sodium chloride (PF)  3 mL Intravenous Q8H       PRN Meds: bisacodyl, diphenhydrAMINE, docusate sodium, measles, mumps and rubella vaccine, nalbuphine, naloxone, ondansetron, oxyCODONE-acetaminophen, simethicone, sodium chloride (PF), TdaP Booster  Physical     BP 127/64 mmHg  Pulse 109  Temp(Src) 98.1 F (36.7 C) (Oral)  Resp 18  Ht 1.6 m (5\' 3" )  Wt 132.45 kg (292 lb)  BMI 51.74 kg/m2  SpO2 98%  LMP 11/07/2014  Breastfeeding? Yes    Pain:                           Site:  Lower abdomen  Pain Assessment  Charting Type: Assessment (08/08/15 0800)  Pain Scale Used: Numeric Scale (0-10) (pain-0) (08/08/15 1542)      General appearance  --WDWN, adult female, alert, well appearing, obese, and in no distress.  Mental status -alert, oriented to person, place, and time  Psych - appropriate affect and mood, converse well, good eye contact.  Neurological - no headache or back pain.  BLE sensory grossly intact.  Musculoskeletal - Ambulated.  BLE gross motor intact.  Extremities -peripheral pulses normal, positive BLE/pedal edema, no cyanosis  Skin -pale coloration and good turgor, no rashes.     Recent Labs      08/08/15   0639   HEMOGLOBIN  6.2*   HEMATOCRIT  18.8*       No results for input(s): BUN, CREAT, AST, ALT in the last 24 hours.    Invalid input(s): ALP, BILIT    Impression     1.  Epidural block fully resolved without apparent anesthesia complications  2.  Acute pain - good control    Plan     1.Continue with current analgesic regimen as patient has done well.  2. Advised patient on multimodal pain management strategies as well as side effect management.   3. Discussed  with patient pain expectations and balancing approach with meds safety/function.  4. Advised on strategies to taper off opioids with decreasing pain and healing.    Cleatis Polka DNP  Pain Service  08/08/2015, 5:24 PM

## 2015-08-08 NOTE — Plan of Care (Signed)
Pt up ambulating around room, about to take a shower. Infant in bassinet. Pt just formula fed him at 1840 per request. Voiding without difficulty. Tolerating PO well. Taking PO pain meds for pain control.

## 2015-08-08 NOTE — Progress Notes (Signed)
Pt. oob with 1 assist, tolerated well, vd. , back to bed, stable, call bell in reach.

## 2015-08-08 NOTE — Progress Notes (Signed)
Pt. oob with 1 assist,  Vd. 330 ml, tolerated well, no distress, ffc down 1, lochia small

## 2015-08-09 DIAGNOSIS — Z8759 Personal history of other complications of pregnancy, childbirth and the puerperium: Secondary | ICD-10-CM | POA: Diagnosis not present

## 2015-08-09 DIAGNOSIS — O133 Gestational [pregnancy-induced] hypertension without significant proteinuria, third trimester: Secondary | ICD-10-CM | POA: Diagnosis present

## 2015-08-09 LAB — CBC AND DIFFERENTIAL
Basophils %: 0.3 % (ref 0.0–3.0)
Basophils Absolute: 0 10*3/uL (ref 0.0–0.3)
Eosinophils %: 2.8 % (ref 0.0–7.0)
Eosinophils Absolute: 0.3 10*3/uL (ref 0.0–0.8)
Hematocrit: 17.6 % — CL (ref 36.0–48.0)
Hemoglobin: 5.9 gm/dL — CL (ref 12.0–16.0)
Lymphocytes Absolute: 1.7 10*3/uL (ref 0.6–5.1)
Lymphocytes: 18.5 % (ref 15.0–46.0)
MCH: 27 pg — ABNORMAL LOW (ref 28–35)
MCHC: 33 gm/dL (ref 32–36)
MCV: 80 fL (ref 80–100)
MPV: 7.9 fL (ref 6.0–10.0)
Monocytes Absolute: 0.7 10*3/uL (ref 0.1–1.7)
Monocytes: 7.6 % (ref 3.0–15.0)
Neutrophils %: 70.8 % (ref 42.0–78.0)
Neutrophils Absolute: 6.5 10*3/uL (ref 1.7–8.6)
PLT CT: 229 10*3/uL (ref 130–440)
RBC: 2.2 10*6/uL — ABNORMAL LOW (ref 3.80–5.00)
RDW: 16.1 % — ABNORMAL HIGH (ref 11.0–14.0)
WBC: 9.1 10*3/uL (ref 4.0–11.0)

## 2015-08-09 LAB — TYPE AND SCREEN
AB Screen: NEGATIVE
ABO Rh: O POS

## 2015-08-09 LAB — PREPARE RBC: Quantity: 2

## 2015-08-09 NOTE — Plan of Care (Signed)
Reviewed plan of care. Notify nurse of any questions/concerns. Encourage use of IS and ambulation. Denies needs. Will continue to monitor.

## 2015-08-09 NOTE — Lactation Note (Signed)
Safe preparation and feeding of formula info sheet given to mother.

## 2015-08-09 NOTE — Progress Notes (Addendum)
Postoperative Day 2 C-section      Subjective:     Patient doing well. Voiding, passing flatus and stool , ambulating in room, tolerating regular diet. Pain controlled. Complains of nothing. Patient is breast and bottlefeeding. Patient notes her bleeding to be moderate. Hgb this am down to 5.8. Aside from mild tachycardia patient is asymptomatic. Discussed need for transfusion and patient is agreeable with plan.     Objective:     Temp Readings from Last 1 Encounters:   08/09/15 98.6 F (37 C) Oral     BP Readings from Last 1 Encounters:   08/09/15 141/86     Pulse Readings from Last 1 Encounters:   08/09/15 110        Physical Exam:   Breasts: breasts appear normal, no suspicious masses, no skin or nipple changes or axillary nodes.  CVS exam: normal rate, regular rhythm, normal S1, S2, no murmurs, rubs, clicks or gallops.  Respiratory: Effortless, clear bilaterally A&P  Fundus--firm NT at Umbilicus -2  Incision--clean, dry, and intact  Ext--calves non tender, 1+ pedal edema    Lab Results   Component Value Date    HGB 5.9* 08/09/2015    HGB 6.2* 08/08/2015    HCT 17.6* 08/09/2015    HCT 18.8* 08/08/2015        Assessment/Plan:     Impression:    1. Stable POD 2 s/p primary low transverse cesarean section.   2. Postpartum hemorrhage  3. Needing Tranfusion    Plan:  1. Routine post-op care  2. Tranfuse 2 U Packed RBC's  3. Anticipate D/C home POD#3 or #4.  4. Birth control: undecided    Neysa Hotter, [place]  08/09/2015 8:55 AM

## 2015-08-10 LAB — CBC AND DIFFERENTIAL
Basophils %: 0 % (ref 0.0–3.0)
Basophils Absolute: 0 10*3/uL (ref 0.0–0.3)
Eosinophils %: 1 % (ref 0.0–7.0)
Eosinophils Absolute: 0.1 10*3/uL (ref 0.0–0.8)
Hematocrit: 24.6 % — ABNORMAL LOW (ref 36.0–48.0)
Hemoglobin: 8.3 gm/dL — ABNORMAL LOW (ref 12.0–16.0)
Lymphocytes Absolute: 2.7 10*3/uL (ref 0.6–5.1)
Lymphocytes: 20 % (ref 15.0–46.0)
MCH: 28 pg (ref 28–35)
MCHC: 34 gm/dL (ref 32–36)
MCV: 82 fL (ref 80–100)
MPV: 8.1 fL (ref 6.0–10.0)
Monocytes Absolute: 0.9 10*3/uL (ref 0.1–1.7)
Monocytes: 7 % (ref 3.0–15.0)
Neutrophils %: 72 % (ref 42.0–78.0)
Neutrophils Absolute: 9.6 10*3/uL — ABNORMAL HIGH (ref 1.7–8.6)
PLT CT: 319 10*3/uL (ref 130–440)
RBC: 3 10*6/uL — ABNORMAL LOW (ref 3.80–5.00)
RDW: 16.4 % — ABNORMAL HIGH (ref 11.0–14.0)
WBC: 13.3 10*3/uL — ABNORMAL HIGH (ref 4.0–11.0)

## 2015-08-10 LAB — CROSSMATCH PRBCS, 2 UNITS
01 -   Blood Type: O NEG
01 -   Issue Date/Time: 201610121142
01 -   Status Info: TRANSFUSED
02 -   Blood Type: O NEG
02 -   Issue Date/Time: 201610121618
02 -   Status Info: TRANSFUSED

## 2015-08-10 MED ORDER — OXYCODONE-ACETAMINOPHEN 5-325 MG PO TABS
1.0000 | ORAL_TABLET | ORAL | Status: DC | PRN
Start: 2015-08-10 — End: 2016-03-06

## 2015-08-10 MED ORDER — IBUPROFEN 600 MG PO TABS
600.0000 mg | ORAL_TABLET | Freq: Four times a day (QID) | ORAL | Status: DC
Start: 2015-08-10 — End: 2019-02-04

## 2015-08-10 NOTE — Progress Notes (Signed)
Postoperative Day 4 C-section      Subjective:     Patient doing well. Voiding, ambulating, tolerating regular diet. Pain controlled. Complains of nothing. Patient is bottlefeeding. Patient notes her bleeding to be small.    Objective:     Temp Readings from Last 1 Encounters:   08/10/15 98.4 F (36.9 C) Oral     BP Readings from Last 1 Encounters:   08/10/15 139/80     Pulse Readings from Last 1 Encounters:   08/10/15 107        Physical Exam:   Breasts: breasts appear normal, no suspicious masses, no skin or nipple changes or axillary nodes.  CVS exam: normal rate, regular rhythm, normal S1, S2, no murmurs, rubs, clicks or gallops.  Respiratory: Effortless, clear bilaterally A&P  Fundus--firm NT at Umbilicus -2  Incision--clean, dry, and intact  Ext--calves non tender, 1+ pedal edema    Lab Results   Component Value Date    HGB 8.3* 08/09/2015    HGB 5.9* 08/09/2015    HCT 24.6* 08/09/2015    HCT 17.6* 08/09/2015        Assessment/Plan:     Impression:    1. Stable POD 4 s/p primary low transverse cesarean section.   2. Doing well.    Plan:  1. Routine post-op care  2. Anticipate D/C home today  3. Birth control: undecided    Neysa Hotter, [place]  08/10/2015 9:08 AM

## 2015-08-10 NOTE — Discharge Summary (Signed)
OBSTETRICS - DELIVERY DISCHARGE SUMMARY    08/10/2015 9:17 AM    Admission Date: 08/05/2015  Discharge Date: 08/10/2015     Discharge Diagnosis:  Term intrauterine pregnancy now delivered via primary cesarean section .    Date of Delivery: 08/07/2015    Time of Delivery:  1:51 AM    Delivered By: Levander Campion   Delivery Type: Cesarean   EDC: Estimated Date of Delivery: 08/14/15 Gestational Age: [redacted]w[redacted]d  Baby: Liveborn Female ; Apgar 1 minute: 8  Apgar 5 minute: 9 ; Birth Weight: 9 lb 0.1 oz (4085 g)   Anesthesia: Epidural  Spinal   Delivery Complications: None.  Laceration: None  Laceration Type:    degree. Episiotomy: None     Placenta: 08/07/2015 , 1:52 AM , Manual removal , Intact , Cord:  3 vessels     Feeding Method:       Patient Active Problem List   Diagnosis   . Delivered by cesarean section   . Postpartum hemorrhage   . Gestational hypertension w/o significant proteinuria in 3rd trimester       Hospital Course: Kristi Branch is a 24 y.o. female admitted to the [facility] and delivered via Cesarean  delivery. Her postpartum course was uncomplicated. The patient's vital signs remained stable and the patient remained afebrile throughout her hospitalization. She was discharged to home in stable condition.    Postpartum Complications: None.    Discharge Medications:  Current Discharge Medication List      START taking these medications    Details   ibuprofen (ADVIL,MOTRIN) 600 MG tablet Take 1 tablet (600 mg total) by mouth every 6 (six) hours.  Qty: 45 tablet, Refills: 0      oxyCODONE-acetaminophen (PERCOCET) 5-325 MG per tablet Take 1 tablet by mouth every 4 (four) hours as needed.  Qty: 30 tablet, Refills: 0         CONTINUE these medications which have NOT CHANGED    Details   ferrous sulfate 325 (65 FE) MG tablet Take 325 mg by mouth 2 (two) times daily.      Prenatal MV-Min-Fe Fum-FA-DHA (PRENATAL 1 PO) Take 1 tablet by mouth.           Discharge Followup:  Unless otherwise  instructed:  For this vaginal delivery we recommend that you followup in six (6) weeks for a postpartum checkup at the site of your prenatal care and RTO 2 weeks for BP check.     Discharge Instructions:  Patient to follow discharge instructions as provided at the time of discharge.    Neysa Hotter, CNM

## 2015-08-10 NOTE — Progress Notes (Signed)
Pt discharge teaching completed. Pt discharged via WC arm bands checked with infant no complaints. Pt was medicated for anticipatory pain infant in arms.

## 2015-08-14 ENCOUNTER — Inpatient Hospital Stay
Admission: EM | Admit: 2015-08-14 | Payer: Medicaid Other | Source: Ambulatory Visit | Admitting: Obstetrics & Gynecology

## 2016-03-06 ENCOUNTER — Emergency Department
Admission: EM | Admit: 2016-03-06 | Discharge: 2016-03-06 | Disposition: A | Discharge status: Home / Self Care | Payer: Medicaid Other | Attending: Emergency Medicine | Admitting: Emergency Medicine

## 2016-03-06 ENCOUNTER — Emergency Department: Payer: Medicaid Other

## 2016-03-06 DIAGNOSIS — H60503 Unspecified acute noninfective otitis externa, bilateral: Secondary | ICD-10-CM | POA: Insufficient documentation

## 2016-03-06 MED ORDER — LEVOFLOXACIN 750 MG PO TABS
750.0000 mg | ORAL_TABLET | Freq: Every day | ORAL | Status: AC
Start: 2016-03-06 — End: 2016-03-13

## 2016-03-06 MED ORDER — CIPROFLOXACIN-DEXAMETHASONE 0.3-0.1 % OT SUSP
4.0000 [drp] | Freq: Two times a day (BID) | OTIC | Status: AC
Start: 2016-03-06 — End: 2016-03-13

## 2016-03-06 MED ORDER — NEOMYCIN-POLYMYXIN-HC 3.5-10000-1 OT SUSP
3.0000 [drp] | Freq: Once | OTIC | Status: AC
Start: 2016-03-06 — End: 2016-03-06
  Administered 2016-03-06: 3 [drp] via OTIC

## 2016-03-06 MED ORDER — CIPROFLOXACIN-HYDROCORTISONE 0.2-1 % OT SUSP
OTIC | Status: AC
Start: 2016-03-06 — End: ?
  Filled 2016-03-06: qty 10

## 2016-03-06 NOTE — ED Provider Notes (Signed)
[facility] EMERGENCY DEPARTMENT History and Physical Exam      Patient Name: Kristi Branch, Kristi Branch  Encounter Date:  03/06/2016  Attending Physician: Hall Busing Kord Monette, MD  PCP: Christa See, MD  Patient DOB:  December 18, 1990  MRN:  [id number]  Room:  S29/S29-A      History of Presenting Illness     Chief complaint: Otalgia    HPI/ROS is limited by: none  HPI/ROS given by: patient    Location: bilateral ear  Duration: 3 days  Severity: moderate    Kristi Branch is a 25 y.o. female who presents with double ear pain, right more than left which began 3 days ago. She denies any direct trauma to the ears and tried to treat it herself with over the counter ear pain medication without any relief. Patient mentions having similar symptoms previously but her at home remedies have not improved her symptoms. Patient denies experiencing any other infectious symptoms or having any medical history.      Review of Systems     Review of Systems   Constitutional: Negative for fever and chills.   HENT: Positive for ear pain (bilateral ).    Eyes: Negative.    Respiratory: Negative for cough and wheezing.    Cardiovascular: Negative.    Gastrointestinal: Negative for nausea, vomiting, abdominal pain, diarrhea, constipation and abdominal distention.   Genitourinary: Negative for dysuria and hematuria.   Musculoskeletal: Negative.    Skin: Negative.  Negative for rash.   Neurological: Negative for dizziness, weakness and headaches.   Psychiatric/Behavioral: Negative.         Allergies     Pt has No Known Allergies.    Medications     Current Outpatient Rx   Name  Route  Sig  Dispense  Refill   . ciprofloxacin-dexamethasone (CIPRODEX) otic suspension    Both Ears    Place 4 drops into both ears 2 (two) times daily. Apply to the affected ear(s)    7.5 mL    1     . ferrous sulfate 325 (65 FE) MG tablet    Oral    Take 325 mg by mouth 2 (two) times daily.             Marland Kitchen ibuprofen (ADVIL,MOTRIN) 600 MG tablet    Oral    Take 1 tablet (600 mg  total) by mouth every 6 (six) hours.    45 tablet    0     . levoFLOXacin (LEVAQUIN) 750 MG tablet    Oral    Take 1 tablet (750 mg total) by mouth daily.    7 tablet    0     . Prenatal MV-Min-Fe Fum-FA-DHA (PRENATAL 1 PO)    Oral    Take 1 tablet by mouth.             . DISCONTD: oxyCODONE-acetaminophen (PERCOCET) 5-325 MG per tablet    Oral    Take 1 tablet by mouth every 4 (four) hours as needed.    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    Past Surgical History     Pt has past surgical history that includes CESAREAN SECTION (N/A, 08/07/2015).    Family History     The family history includes Asthma in her mother; COPD in her mother; Hypertension  in her paternal grandmother.    Social History     Pt reports that she has never smoked. She has never used smokeless tobacco. She reports that she does not drink alcohol or use illicit drugs.    Physical Exam     Blood pressure 153/88, pulse 99, temperature 98.4 F (36.9 C), temperature source Oral, resp. rate 18, weight 134 kg, SpO2 97 %, currently breastfeeding.    Physical Exam   Constitutional: She is oriented to person, place, and time. She appears well-developed and well-nourished.   HENT:   Head: Normocephalic and atraumatic.   Right Ear: There is swelling and tenderness. Tympanic membrane is erythematous.   Left Ear: There is swelling and tenderness. Tympanic membrane is erythematous.   Right ear swelling in the pena entry. Almost completely occluded.   Left ear swelling of the canal and exterior ear. Exudates.     Eyes: Pupils are equal, round, and reactive to light.   Neck: Normal range of motion. Neck supple.   Cardiovascular: Normal rate and regular rhythm.    Pulmonary/Chest: Effort normal and breath sounds normal.   Musculoskeletal: Normal range of motion.   Neurological: She is alert and oriented to person, place, and time. She has normal  reflexes.   Skin: Skin is warm.   Nursing note and vitals reviewed.       Orders Placed     Orders Placed This Encounter   Procedures   . Ear dressing       Diagnostic Results       The results of the diagnostic studies below have been reviewed by myself:    Labs  Results     ** No results found for the last 24 hours. **          Radiologic Studies  Radiology Results (24 Hour)     ** No results found for the last 24 hours. **            MDM / Critical Care     Blood pressure 153/88, pulse 99, temperature 98.4 F (36.9 C), temperature source Oral, resp. rate 18, weight 134 kg, SpO2 97 %, currently breastfeeding.    The patient presents with an ear infection. The patient underwent evaluation and treatment and they are clinically well appearing and afebrile. Serious sequelae of an ear infection like meningitis, mastoiditis or sepsis were considered and thought unlikely. They are stable for discharge home with recommended medications and symptomatic care. They have been warned to return immediately for worsening symptoms, dehydration, signs of systemic illness or any acute concerns. The patient was given fever precautions and was encouraged to follow-up with their primary care physician as needed.  Diagnostic impression and plan were discussed with the patient and/or family.  If performed results of lab/radiology tests were reviewed and discussed with the patient and/or family. All questions were answered and concerns addressed.      Procedures   NAME OF PROCEDURE   By Hall Busing Maralee Higuchi, MD  9:26 PM    Indication: placed wick in ear  Location: right ear and placed ciprodex    Verbal consent.  Patient identified and location verified.  Was tight space but tolerated ok. Patient tolerated procedure well with no complications.            EKG     none    Diagnosis / Disposition     Clinical Impression  1. Acute otitis externa of both ears, unspecified type  Disposition  ED Disposition     Discharge Wendy Hoback  [facility] discharge to home/self care.    Condition at disposition: Stable            Prescriptions  Discharge Medication List as of 03/06/2016 10:26 AM      START taking these medications    Details   ciprofloxacin-dexamethasone (CIPRODEX) otic suspension Place 4 drops into both ears 2 (two) times daily. Apply to the affected ear(s), Starting 03/06/2016, Until Wed 03/13/16, Print      levoFLOXacin (LEVAQUIN) 750 MG tablet Take 1 tablet (750 mg total) by mouth daily., Starting 03/06/2016, Until Wed 03/13/16, Print             Attestations     The documentation recorded by my scribe, Charlsie Merles, accurately reflects the services I personally performed and the decisions made by me.  Dr. Valli Glance Ariyan Brisendine    Aulton Routt, Hall Busing, MD  03/06/16 2126

## 2016-03-06 NOTE — ED Notes (Signed)
Pt c/o b/l ear pain R>L for the past few days. Pt has hx of ear infections.

## 2016-03-06 NOTE — Discharge Instructions (Signed)
External Ear Infection (Adult)    External otitis (also called "swimmer's ear") is an infection in the ear canal. It is often caused by bacteria or fungus. It can occur a few days after water gets trapped in the ear canal (from swimming or bathing). It can also occur after cleaning too deeply in the ear canal with a cotton swab or other object. Sometimes, hair care products get into the ear canal and cause this problem.  Symptoms can include pain, fever, itching, redness, drainage, or swelling of the ear canal. Temporary hearing loss may also occur.  Home care   Do not try to clean the ear canal. This can push pus and bacteria deeper into the canal.   Use prescribed ear drops as directed. These help reduce swelling and fight the infection. If an ear wick was placed in the ear canal, apply drops right onto the end of the wick. The wick will draw the medication into the ear canal even if it is swollen closed.   A cotton ball may be loosely placed in the outer ear to absorb any drainage.   You may use acetaminophen or ibuprofen to control pain, unless another medication was prescribed. Note: If you have chronic liver or kidney disease or ever had a stomach ulcer or GI bleeding, talk to your health care provider before taking any of these medications.   Do not allow water to get into your ear when bathing. Also, avoid swimming until the infection has cleared.  Prevention   Keep your ears dry. This helps lower the risk of infection. Dry your ears with a towel or hair dryer after getting wet. Also, use ear plugs when swimming.   Do not stick any objects in the ear to remove wax.   If you feel water trapped in your ear, use ear drops right away. You can get these drops over the counter at most drugstores. They work by removing water from the ear canal.  Follow-up care  Follow up with your health care provider in one week, or as advised.  When to seek medical advice  Aydenn Gervin your health care provider right away if  any of these occur:   Ear pain becomes worse or doesn't improve after 3 days of treatment   Redness or swelling of the outer ear occurs or gets worse   Headache   Painful or stiff neck   Drowsiness or confusion   Fever of 100.4F (38C) or higher, or as directed by your health care provider   Seizure  Date Last Reviewed: 01/16/2014   2000-2016 [employer] [address], [place] [address]. All rights reserved. This information is not intended as a substitute for professional medical care. Always follow your healthcare professional's instructions.

## 2019-02-04 ENCOUNTER — Emergency Department
Admission: EM | Admit: 2019-02-04 | Discharge: 2019-02-04 | Disposition: A | Discharge status: Home / Self Care | Payer: Medicaid Other | Attending: Emergency Medicine | Admitting: Emergency Medicine

## 2019-02-04 ENCOUNTER — Emergency Department: Payer: Medicaid Other

## 2019-02-04 DIAGNOSIS — Z331 Pregnant state, incidental: Secondary | ICD-10-CM | POA: Insufficient documentation

## 2019-02-04 DIAGNOSIS — Z3A25 25 weeks gestation of pregnancy: Secondary | ICD-10-CM | POA: Insufficient documentation

## 2019-02-04 DIAGNOSIS — O212 Late vomiting of pregnancy: Secondary | ICD-10-CM | POA: Insufficient documentation

## 2019-02-04 LAB — VH URINALYSIS WITH MICROSCOPIC
Bilirubin, UA: NEGATIVE
Blood, UA: NEGATIVE
Glucose, UA: NEGATIVE mg/dL
Ketones UA: NEGATIVE mg/dL
Leukocyte Esterase, UA: NEGATIVE Leu/uL
Nitrite, UA: POSITIVE — AB
Protein, UR: 30 mg/dL — AB
RBC, UA: 1 /hpf (ref 0–5)
Squam Epithel, UA: 5 /hpf — ABNORMAL HIGH (ref 0–2)
Urine Specific Gravity: 1.027 (ref 1.001–1.040)
Urobilinogen, UA: NORMAL mg/dL
WBC, UA: 2 /hpf (ref 0–4)
pH, Urine: 6 pH (ref 5.0–8.0)

## 2019-02-04 LAB — COMPREHENSIVE METABOLIC PANEL
ALT: 8 U/L (ref 0–55)
AST (SGOT): 13 U/L (ref 10–42)
Albumin/Globulin Ratio: 0.76 Ratio — ABNORMAL LOW (ref 0.80–2.00)
Albumin: 2.9 gm/dL — ABNORMAL LOW (ref 3.5–5.0)
Alkaline Phosphatase: 90 U/L (ref 40–145)
Anion Gap: 17 mMol/L (ref 7.0–18.0)
BUN / Creatinine Ratio: 12.3 Ratio (ref 10.0–30.0)
BUN: 8 mg/dL (ref 7–22)
Bilirubin, Total: 0.1 mg/dL (ref 0.1–1.2)
CO2: 22 mMol/L (ref 20–30)
Calcium: 8.6 mg/dL (ref 8.5–10.5)
Chloride: 104 mMol/L (ref 98–110)
Creatinine: 0.65 mg/dL (ref 0.60–1.20)
EGFR: 120 mL/min/{1.73_m2} (ref 60–150)
Globulin: 3.8 gm/dL (ref 2.0–4.0)
Glucose: 95 mg/dL (ref 71–99)
Osmolality Calculated: 276 mOsm/kg (ref 275–300)
Potassium: 4 mMol/L (ref 3.5–5.3)
Protein, Total: 6.7 gm/dL (ref 6.0–8.3)
Sodium: 139 mMol/L (ref 136–147)

## 2019-02-04 LAB — CBC AND DIFFERENTIAL
Basophils %: 0.1 % (ref 0.0–3.0)
Basophils Absolute: 0 10*3/uL (ref 0.0–0.3)
Eosinophils %: 2.8 % (ref 0.0–7.0)
Eosinophils Absolute: 0.3 10*3/uL (ref 0.0–0.8)
Hematocrit: 35.6 % — ABNORMAL LOW (ref 36.0–48.0)
Hemoglobin: 12.2 gm/dL (ref 12.0–16.0)
Lymphocytes Absolute: 2.1 10*3/uL (ref 0.6–5.1)
Lymphocytes: 16.5 % (ref 15.0–46.0)
MCH: 27 pg — ABNORMAL LOW (ref 28–35)
MCHC: 34 gm/dL (ref 32–36)
MCV: 79 fL — ABNORMAL LOW (ref 80–100)
MPV: 7.6 fL (ref 6.0–10.0)
Monocytes Absolute: 0.7 10*3/uL (ref 0.1–1.7)
Monocytes: 5.5 % (ref 3.0–15.0)
Neutrophils %: 75.1 % (ref 42.0–78.0)
Neutrophils Absolute: 9.4 10*3/uL — ABNORMAL HIGH (ref 1.7–8.6)
PLT CT: 332 10*3/uL (ref 130–440)
RBC: 4.48 10*6/uL (ref 3.80–5.00)
RDW: 13.4 % (ref 11.0–14.0)
WBC: 12.5 10*3/uL — ABNORMAL HIGH (ref 4.0–11.0)

## 2019-02-04 LAB — HCG QUANTITATIVE: BHCG Quant.: 5190 m[IU]/mL

## 2019-02-04 LAB — HCG, SERUM, QUALITATIVE: BHCG Qualitative: POSITIVE — AB

## 2019-02-04 NOTE — ED Provider Notes (Signed)
[facility]  EMERGENCY DEPARTMENT  History and Physical Exam     Patient Name: Kristi Branch, Kristi Branch  Encounter Date:  02/04/2019  Attending Physician: Molly Maduro L. Kenyon Ana., DO.  Room:  N7/N7-A  Patient DOB:  1991/07/09  Age: 28 y.o. female  MRN:  [id number]  PCP: Pcp, None, MD      Diagnosis/Disposition:  MDM:     Final Impression  1. Incidental pregnancy confirmed        Disposition  ED Disposition     ED Disposition Condition Date/Time Comment    Discharge  Thu Feb 04, 2019 11:55 AM [name] discharge to home/self care.    Condition at disposition: Stable          Follow up  No follow-up provider specified.      ED Summary:    Patient presents to the emerge department complaining of vomiting in the morning she states she has had about 6 episodes over the last 7 weeks.  She is also complaining of general malaise and fatigue.  Unusual contacts or exposures no recent travel she recently moved back to this area does not have a primary care doctor she states.  He came back from [place] about 6 weeks ago.  Patient has a benign exam.  Incidental finding of pregnancy on screening labs he was sent for ultrasound which shows that she is 24 weeks gravid.  Results the patient and she is already contacting her previous gynecologist to establish care.    Differential DX:      The patient presents with nausea/vomiting/diarrhea, now improved, and is clinically well appearing without fever or pain.  The patient underwent evaluation and treatment in the ER and appears improved.  The abdomen is soft and non-tender and the patient is tolerating po well.  Although considered in the differential, at present, clinical suspicion for colitis, C. Diff infection, obstruction, severe electrolyte derangement, or other serious intra-abdominal condition is low.   If performed resulted labs and images were reviewed and discussed with patient and/or family.  Nausea/vomiting/diarrhea precautions have been given, diet  recommendations were discussed, and the patient is stable for discharge. The patient is warned to return immediately for worsening symptoms including pain or fever, or any other acute concerns, and to follow-up with PCP as needed and for any pending lab results.  All questions were answered and concerns addressed.         The results of diagnostic studies have been reviewed by myself. Available past medical, family, social, and surgical histories have been reviewed by myself. The clinical impression and plan have been discussed with the patient and/or the patient's family. All questions have been answered.      History of Presenting Illness:     Chief complaint: Emesis      Kristi Branch is a 28 y.o. female presenting to ED room 7 from home with the complaint of nausea for the past couple of weeks with 3 or 4 episodes of vomiting.  Pt reports that she has not seen any other doctor or been to urgent care to seek treatment.  She just started a job 2 weeks ago but denies being exposed to any unusual chemical.  She returned from [place] in February but reports that she was fine after that.  She denies fever, cough, diarrhea, CP, SOB, urinary symptoms and she has no other complaints at this time.        Review of Systems:  Physical Exam:  Review of Systems   Constitutional: Negative for fever.   HENT: Negative for congestion.    Respiratory: Negative for shortness of breath.    Cardiovascular: Negative for chest pain.   Gastrointestinal: Positive for nausea and vomiting. Negative for abdominal pain and diarrhea.   Genitourinary: Negative for difficulty urinating.   Musculoskeletal: Negative for myalgias.   Skin: Negative for color change.   Neurological: Negative for headaches.   Psychiatric/Behavioral: Negative.      Vitals:    02/04/19 0904 02/04/19 0924 02/04/19 0930 02/04/19 1153   BP: 131/74 133/75  134/80   Pulse: 95      Resp: 19      Temp: 97.2 F (36.2 C)      TempSrc: Tympanic      SpO2: 100% 100%  100% 100%   Weight: 123.3 kg      Height: 1.626 m             Physical Exam   Constitutional: She is oriented to person, place, and time. She appears well-developed and well-nourished. No distress.   HENT:   Head: Normocephalic and atraumatic.   Mouth/Throat: Oropharynx is clear and moist.   Eyes: Pupils are equal, round, and reactive to light. Conjunctivae are normal. Right eye exhibits no discharge. Left eye exhibits no discharge.   Neck: Normal range of motion. Neck supple.   Cardiovascular: Normal rate, regular rhythm and intact distal pulses.   Pulmonary/Chest: Effort normal. No respiratory distress.   Abdominal: Soft. She exhibits no distension. There is no abdominal tenderness.   Musculoskeletal: Normal range of motion.   Neurological: She is alert and oriented to person, place, and time.   Skin: Skin is warm and dry. She is not diaphoretic.   Psychiatric: She has a normal mood and affect. Her behavior is normal.   Nursing note and vitals reviewed.         Allergies & Medications:     Pt has No Known Allergies.    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    Surgical: Pt  has a past surgical history that includes CESAREAN SECTION (N/A, 08/07/2015).    Family: The family history includes Asthma in her mother; COPD in her mother; Hypertension in her paternal grandmother.    Social: Pt reports that she has never smoked. She has never used smokeless tobacco. She reports that she does not drink alcohol or use drugs.        Diagnostic Results:     Radiologic Studies  US Ob Ltd 1 Or More    Result Date: 02/04/2019  1.  Single live intrauterine pregnancy. Ultrasound age is 24 weeks 5 days and the ultrasound estimated due date is  May 22, 2019. 2.  Fetal anatomy survey was not performed in the acute setting of the emergency department. Recommendation: Routine second trimester OB ultrasound as part of routine prenatal care. ReadingStation:[email or web address]      Lab Studies  Labs Reviewed   HCG, SERUM, QUALITATIVE - Abnormal; Notable for the following components:  Result Value    BHCG Qual Positive (*)     All other components within normal limits   CBC AND DIFFERENTIAL - Abnormal; Notable for the following components:    WBC 12.5 (*)     Hematocrit 35.6 (*)     MCV 79 (*)     MCH 27 (*)     Neutrophils Absolute 9.4 (*)     All other components within normal limits   COMPREHENSIVE METABOLIC PANEL - Abnormal; Notable for the following components:    Albumin 2.9 (*)     Albumin/Globulin Ratio 0.76 (*)     All other components within normal limits   VH URINALYSIS WITH MICROSCOPIC - Abnormal; Notable for the following components:    Clarity, UA Slightly Cloudy (*)     Protein, UR 30 (*)     Nitrite, UA Positive (*)     Bacteria, UA Many (*)     Squam Epithel, UA 5 (*)     All other components within normal limits   HCG QUANTITATIVE         Procedure/EKG:     EKG:   Last EKG Result     None          Rhythm Strip Interpretation:        ATTESTATIONS     Graceanne Guin L. Kenyon Ana., DO    The documentation may have been recorded by my scribe, Loleta Chance , and reflects the services I personally performed and the decisions made by me.  Edrick Kins., DO    The results of diagnostic studies have been reviewed by myself. The above past medical, family, social, and surgical histories have been reviewed by myself. The clinical impression and plan have been discussed with the patient and/or the patient's family. All questions have been answered.    Note: This chart was generated by an EMR and may contain errors, including typographical, or omissions not intended by the user. This chart was generated by the Epic EMR system/speech recognition and may  contain inherent errors or omissions not intended by the user. Grammatical errors, random word insertions, deletions, pronoun errors and incomplete sentences are occasional consequences of this technology due to software limitations. Not all errors are caught or corrected. If there are questions or concerns about the content of this note or information contained within the body of this dictation they should be addressed directly with the author for clarification            Edrick Kins., DO  02/04/19 1205

## 2019-02-04 NOTE — ED Triage Notes (Signed)
Pt arrives with CC of emesis. Pt states symptoms started a few weeks ago. Pt states that her last period was in October or November. Pt. States that with her first child she had two periods and didn't have another one for a year after. Pt. States that she could be pregnant but doesn't know for sure. Pt denies abdominal pain or discomfort. Pt. States she tends to vomit in the morning hours. Pt. States nothing seems to make it better or worse. She will get a wave of nausea and then begin gagging and then vomit. Pt. Denies any other pain, fevers, chills.

## 2019-02-04 NOTE — Discharge Instructions (Signed)
Pregnancy: Your Third Trimester Changes  As the baby grows, your body changes too. You may also see signs that your body is getting ready for labor. Be patient. Within a few more weeks, your baby will be born.  How you are changing  Your body is preparing for the birth of your baby. Some of the most common changes are listed below. If you have any questions or concerns, ask your healthcare provider:   You'll gain more weight from fluids, extra blood, and fat deposits.   Your breasts will grow as your body gets ready to feed the baby. They may be more tender. You may also notice a slight yellow or white discharge from the nipples.   Discharge from your vagina may increase. This is normal.   You might see some skin color changes on your forehead, cheeks, or nose. Most of these will go away after you deliver.  How your baby is growing       Month 7  Your babycan open and close his or her eyes andweighs around 4 pounds.If born prematurely (too early), your baby would likely survive with special care. Month 8  Your baby is building up body fat andweighs around 6 pounds. Month 9  Your baby weighs nearly 7 pounds and is about 19 to 21 inches long. In other words, any day now...   [employer] last reviewed this educational content on 11/28/2016   2000-2020 [employer] [address], [place] [address]. All rights reserved. This information is not intended as a substitute for professional medical care. Always follow your healthcare professional's instructions.

## 2019-02-16 ENCOUNTER — Encounter (INDEPENDENT_AMBULATORY_CARE_PROVIDER_SITE_OTHER): Payer: Self-pay | Admitting: Physician Assistant

## 2019-02-16 ENCOUNTER — Telehealth (INDEPENDENT_AMBULATORY_CARE_PROVIDER_SITE_OTHER): Payer: Medicaid Other | Admitting: Physician Assistant

## 2019-02-16 VITALS — Resp 20 | Ht 62.0 in | Wt 230.0 lb

## 2019-02-16 DIAGNOSIS — J029 Acute pharyngitis, unspecified: Secondary | ICD-10-CM

## 2019-02-16 MED ORDER — AMOXICILLIN 500 MG PO CAPS
500.00 mg | ORAL_CAPSULE | Freq: Two times a day (BID) | ORAL | 0 refills | Status: AC
Start: 2019-02-16 — End: 2019-02-26

## 2019-02-16 NOTE — Progress Notes (Signed)
Subjective:    Patient ID: Kristi Branch is a 28 y.o. female.    The patient was evaluated using telehealth platform. Exam findings are visual observation and/or having patient perform various physical exam tasks under my guidance. Verbal consent for evaluation was obtained.     Sore Throat    This is a new problem. The current episode started yesterday. The problem has been gradually worsening. Neither side of throat is experiencing more pain than the other. There has been no fever. The pain is at a severity of 7/10. Pertinent negatives include no congestion, diarrhea, shortness of breath, trouble swallowing or vomiting. She has tried nothing for the symptoms.       The following portions of the patient's history were reviewed and updated as appropriate: allergies, current medications, past family history, past medical history, past social history, past surgical history and problem list.    Review of Systems   Constitutional: Negative for appetite change, chills and fever.   HENT: Positive for sore throat. Negative for congestion, postnasal drip and trouble swallowing.    Eyes: Negative for discharge and redness.   Respiratory: Negative for choking and shortness of breath.    Cardiovascular: Negative for chest pain and palpitations.   Gastrointestinal: Negative for diarrhea, nausea and vomiting.   Genitourinary: Negative for dysuria and frequency.   Musculoskeletal: Negative for myalgias.         Objective:    Resp 20   Ht 1.575 m (5\' 2" )   Wt 104.3 kg (230 lb)   LMP  (Approximate)   BMI 42.07 kg/m     Physical Exam  Vitals signs and nursing note reviewed.   Constitutional:       General: She is not in acute distress.     Appearance: Normal appearance. She is not ill-appearing.   HENT:      Head: Normocephalic and atraumatic.      Right Ear: External ear normal.      Left Ear: External ear normal.      Nose: Nose normal.      Mouth/Throat:      Lips: Pink.      Mouth: Mucous membranes are moist.       Pharynx: Oropharynx is clear. Uvula midline. Posterior oropharyngeal erythema present.   Eyes:      Conjunctiva/sclera: Conjunctivae normal.   Pulmonary:      Effort: Pulmonary effort is normal. No respiratory distress.      Breath sounds: Normal breath sounds. No stridor.   Neurological:      Mental Status: She is alert.   Psychiatric:         Mood and Affect: Mood normal.         Behavior: Behavior normal. Behavior is cooperative.           Assessment and Plan:       [name] was seen today for sore throat.    Diagnoses and all orders for this visit:    Pharyngitis, unspecified etiology    Other orders  -     amoxicillin (AMOXIL) 500 MG capsule; Take 1 capsule (500 mg total) by mouth 2 (two) times daily for 10 days    I explained to patient today that sore throats are often viral in nature.  I encouraged her to try some Tylenol and salt water gargles.  I sent in a prescription for amoxicillin to her pharmacy and advised her to start the medication if her symptoms are worsening. We discussed  s/s that would warrant evaluation in the ED; pt expressed understanding.             Ferman Hamming, PA  [facility]  02/16/2019  9:31 AM

## 2019-02-19 ENCOUNTER — Telehealth (INDEPENDENT_AMBULATORY_CARE_PROVIDER_SITE_OTHER): Payer: Self-pay

## 2019-02-19 NOTE — Telephone Encounter (Signed)
Spoke with patient, stated she is doing better since her recent visit

## 2019-02-27 ENCOUNTER — Encounter (INDEPENDENT_AMBULATORY_CARE_PROVIDER_SITE_OTHER): Payer: Self-pay

## 2019-02-27 ENCOUNTER — Telehealth (INDEPENDENT_AMBULATORY_CARE_PROVIDER_SITE_OTHER): Payer: Medicaid Other | Admitting: Nurse Practitioner

## 2019-02-27 VITALS — Resp 12 | Ht 62.0 in | Wt 240.0 lb

## 2019-02-27 DIAGNOSIS — R399 Unspecified symptoms and signs involving the genitourinary system: Secondary | ICD-10-CM

## 2019-02-27 MED ORDER — CEPHALEXIN 500 MG PO CAPS
500.00 mg | ORAL_CAPSULE | Freq: Two times a day (BID) | ORAL | 0 refills | Status: AC
Start: 2019-02-27 — End: 2019-03-06

## 2019-02-27 NOTE — Progress Notes (Signed)
Kristi Branch is a 28 y.o. female who complains of frequency and burning and urination since early today. She is [redacted] weeks pregnant. She reports multiple UTIs with her last pregnancy and feels like that is what she has today. She denies unusual vaginal discharge, vaginal bleeding, blood in her urine, and denies chance of STD. She denies other complaints today.  She denies a history of asthma. Patient denies smoking cigarettes. The patient gave consent to be evaluated and treated using the telehealth platform.     Review of Systems   Constitutional: Negative for fever, chills, appetite change and fatigue.   HENT: Negative   Eyes: Negative for discharge, altered vision  Respiratory: Negative for chest tightness and shortness of breath  Cardiovascular: Negative for chest pain   Gastrointestinal: Negative for nausea, vomiting, abdominal pain and diarrhea    Genitourinary: Positive for frequency, burning on urination, negative for difficulty urinating  Musculoskeletal: Negative for myalgias  Skin: Negative for rash or open wounds    Neurological: Negative for headaches or dizziness  Psychiatric/Behavioral: Negative for behavioral changes  The following portions of the patient's history were reviewed and updated as appropriate: allergies, current medications, past medical history, past surgical history and problem list.         Patient reported [id number]   Ht 1.575 m (5\' 2" )   Wt 108.9 kg (240 lb)   BMI 43.90 kg/m       Gen: normal appearance, no acute distress  HEENT: atraumatic, normocephalic   Moist oral mucosa  Eyes: normal conjunctiva  Respiratory: normal effort        Cardiovascular: regular rhythm (patient self-exam)  Abdomen: soft, non-tender, No CVA tenderness(patient self-exam)  Musculoskeletal: normal ROM of the joint without pain  Skin: no rash noted, no flushing, pallor or cyanosis noted  Neurological: alert, oriented x3, no focal deficit noted  Psychiatric/behavioral:  normal mood and normal  thought content       Kristi Branch was seen today for uti.    Diagnoses and all orders for this visit:    UTI symptoms  -     cephalexin (KEFLEX) 500 MG capsule; Take 1 capsule (500 mg total) by mouth 2 (two) times daily for 7 days    PLAN:  Symptomatic therapy suggested: push fluids, rest. Call or return to clinic prn in the next 2 days if these symptoms worsen or fail to improve as anticipated and a urine specimen will be obtained. Patient instructed with fever, chills, back/flank pain, vaginal bleeding to report to ER. The patient verbalized understanding and agreed with the plan.    Exam findings are visual observation and/or having patient perform various physical exam tasks under my guidance. As stated previously, verbal consent for evaluation was obtained.      Sharene Butters, NP  [facility]  02/27/2019 1:50 PM

## 2019-02-27 NOTE — Patient Instructions (Signed)
Understanding Urinary Tract Infections (UTIs)   Most UTIs are caused by bacteria, but they may also be caused by viruses or fungi. Bacteria from the bowel are the most common source of infection. The infection may start because of any of the following:   · Sexual activity.  During sex, bacteria can travel from the penis, vagina, or rectum into the urethra.   · Bacteria outside the rectum getting into the urethra.  Bacteria on the skin outside the rectum may travel into the urethra. This is more common in women since the rectum and urethra are closer to each other than in men. Wiping from front to back after using the toilet and keeping the area clean can help prevent germs from getting to the urethra.  · Blocked urine flow through the urinary tract. If urine sits too long, germs may start to grow out of control.      Parts of the urinary tract  The infection can occur in any part of the urinary tract.   · The kidneys.  These organs collect and store urine.  · The ureters. These tubes carry urine from the kidneys to the bladder.  · The bladder.  This holds urine until you are ready to let it out.  · The urethra. This tube carries urine from the bladder out of the body. It is shorter in women, so bacteria can move through it more easily. The urethra is longer in men, so a UTI is less likely to reach the bladder or kidneys in men.  [employer] last reviewed this educational content on 10/28/2018  © 2000-2020 [employer] [address], [place] [address]. All rights reserved. This information is not intended as a substitute for professional medical care. Always follow your healthcare professional's instructions.

## 2019-03-02 ENCOUNTER — Telehealth (INDEPENDENT_AMBULATORY_CARE_PROVIDER_SITE_OTHER): Payer: Self-pay

## 2019-03-02 NOTE — Telephone Encounter (Signed)
Performed Patient call back She did not answer the phone. Left message informing patient to call back if She has any questions or concerns

## 2019-03-09 LAB — RPR: RPR: NONREACTIVE

## 2019-03-09 LAB — VH RUBELLA SCREEN, SERUM: Rubella IgG: IMMUNE

## 2019-03-09 LAB — HIV AG/AB 4TH GENERATION: HIV Ag/Ab, 4th Generation: NONREACTIVE

## 2019-03-09 LAB — HEPATITIS B SURFACE ANTIGEN W/ REFLEX TO CONFIRMATION: Hepatitis B Surface Antigen: NEGATIVE

## 2019-04-01 ENCOUNTER — Encounter: Payer: Self-pay | Admitting: Obstetrics & Gynecology

## 2019-04-01 ENCOUNTER — Ambulatory Visit
Admission: RE | Admit: 2019-04-01 | Discharge: 2019-04-01 | Disposition: A | Discharge status: Home / Self Care | Payer: Medicaid Other | Source: Ambulatory Visit | Attending: Obstetrics & Gynecology | Admitting: Obstetrics & Gynecology

## 2019-04-01 DIAGNOSIS — O99213 Obesity complicating pregnancy, third trimester: Secondary | ICD-10-CM | POA: Insufficient documentation

## 2019-04-01 DIAGNOSIS — Z3490 Encounter for supervision of normal pregnancy, unspecified, unspecified trimester: Secondary | ICD-10-CM

## 2019-04-01 DIAGNOSIS — Z3A34 34 weeks gestation of pregnancy: Secondary | ICD-10-CM | POA: Insufficient documentation

## 2019-04-15 LAB — GROUP B STREP TRANSCRIBED: GBS Transcribed: NEGATIVE

## 2019-04-21 ENCOUNTER — Emergency Department
Admission: EM | Admit: 2019-04-21 | Discharge: 2019-04-21 | Disposition: A | Discharge status: Home / Self Care | Payer: Medicaid Other | Source: Ambulatory Visit | Attending: Obstetrics & Gynecology | Admitting: Obstetrics & Gynecology

## 2019-04-21 DIAGNOSIS — O99013 Anemia complicating pregnancy, third trimester: Secondary | ICD-10-CM | POA: Insufficient documentation

## 2019-04-21 DIAGNOSIS — Z3A37 37 weeks gestation of pregnancy: Secondary | ICD-10-CM | POA: Insufficient documentation

## 2019-04-21 DIAGNOSIS — O26893 Other specified pregnancy related conditions, third trimester: Secondary | ICD-10-CM | POA: Insufficient documentation

## 2019-04-21 DIAGNOSIS — D649 Anemia, unspecified: Secondary | ICD-10-CM | POA: Insufficient documentation

## 2019-04-21 DIAGNOSIS — Z6841 Body Mass Index (BMI) 40.0 and over, adult: Secondary | ICD-10-CM

## 2019-04-21 DIAGNOSIS — O34219 Maternal care for unspecified type scar from previous cesarean delivery: Secondary | ICD-10-CM | POA: Insufficient documentation

## 2019-04-21 LAB — VH URINE DRUG SCREEN
Amphetamine: NEGATIVE
Barbiturates: NEGATIVE
Cannabinoids: NEGATIVE
Cocaine: NEGATIVE
Opiates: NEGATIVE
Phencyclidine: NEGATIVE
Urine Benzodiazepines: NEGATIVE
Urine Buprenorphine: NEGATIVE
Urine Creatinine Random: 207 mg/dL
Urine Ecstasy Screen: NEGATIVE
Urine Fentanyl Screen: NEGATIVE
Urine Methadone Screen: NEGATIVE
Urine Oxycodone: NEGATIVE
Urine Specific Gravity: 1.036 (ref 1.001–1.040)
pH, Urine: 5.3 pH (ref 5.0–8.0)

## 2019-04-21 NOTE — Progress Notes (Signed)
NST Note    INDICATION - r/o labor, pelvic pressure    NST reviewed:  NST:  reactive  Fetal Heart   140 moderate variability, Accelerations seen, No decelerations seen and Category 1   On interpretation today.    Toco: quiet    Time on the monitor: 30 minutes    Claudette Laws, MD   04/21/19 5:54 PM

## 2019-04-21 NOTE — Discharge Instr - AVS First Page (Signed)
Call your OB Provider, or return to the hospital, if you have any of the following signs:     If more than 36 weeks, regular painful contractions every 3-5 minutes for one hour that increase in strength.     If less than 36 weeks, 6 or more contractions in an hour.     Persistent nausea, vomiting or diarrhea; accompanied by the inability to keep liquids down.     Leaking of amniotic fluid, or water breaking; this can be a gush or small trickle.     Vaginal bleeding, especially any bright red bleeding like a period or passing clots (it is normal to have spotting after vaginal exam or intercourse).     Concern about a decrease in your baby's movements.     Temperature greater than 100.4(F) orally.     Severe headache which is not relieved 60 minutes after taking Tylenol (Acetaminophen); Blurry vision or spots before your eyes, not just when you suddenly stand up; Severe heartburn or pain on the upper rights side of your abdomen that is not relieved by an antacid; Sudden increase in swelling in your face, hands, or feet.

## 2019-04-21 NOTE — ED Notes (Signed)
Pt evaluated and cleared for discharge by Dr. Odis Luster. [place] instructions reviewed with patient.  Pt verbalizes understanding of all instructions.

## 2019-04-21 NOTE — OB ED Provider Note (Signed)
CC:  Pelvic pressure    HPI:  This is a 28 yo morbidly obese P1 @ 37w who presents with c/o pelvic pressure and discomfort.  Pt has a toddler at home and relates she was not this uncomfortable when she was pregnant with him!  +FM  No LOF  No VB  No contractions    No Known Allergies  No current facility-administered medications on file prior to encounter.      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 Treated in ED.    Late prenatal care     32 weeks    Obesity      Past Surgical History:   Procedure Laterality Date    CESAREAN SECTION N/A 08/07/2015    Procedure: CESAREAN SECTION;  Surgeon: Levander Campion, MD;  Location: [facility];  Service: Obstetrics;  Laterality: N/A;     OB History     Gravida   2    Para   1    Term   1    Preterm   0    AB   0    Living   1       SAB   0    TAB   0    Ectopic   0    Multiple   0    Live Births   1               Social History     Tobacco Use    Smoking status: Never Smoker    Smokeless tobacco: Never Used   Substance Use Topics    Alcohol use: No    Drug use: No     Family History   Problem Relation Age of Onset    COPD Mother     Asthma Mother     Hypertension Paternal Grandmother     No known problems Father      Review of Systems   Constitutional: Negative for chills, fever and malaise/fatigue.   HENT: Negative for nosebleeds.    Eyes: Negative for blurred vision and double vision.   Respiratory: Negative for cough, shortness of breath and wheezing.    Cardiovascular: Negative for chest pain and palpitations.   Gastrointestinal: Negative for abdominal pain, diarrhea, heartburn, nausea and vomiting.   Genitourinary: Negative for dysuria and frequency.    Musculoskeletal: Negative for joint pain and myalgias.   Skin: Negative for itching and rash.   Neurological: Negative for dizziness and headaches.   Endo/Heme/Allergies: Does not bruise/bleed easily.     Temp 98.2 F (36.8 C) (Oral)    Resp 19    Ht 1.575 m (5\' 2" )    Wt 135.2 kg (298 lb)    BMI 54.50 kg/m  BP = 132/65    Physical Examination:   General appearance - alert, well appearing, and in no distress, oriented to person, place, and time   Mental status - normal mood, behavior, speech, dress, motor activity, and thought processes  Neck - supple  Chest - CTA  Heart - RRR  Abdomen - soft,  nontender, nondistended, no masses or organomegaly, gravid uterus, UF soft/NT  Pelvic:  Normal female ext gen  Normal vagina without lesions/discharge  Cervix = L/C, PP = OOP  Normal uterus, size = dates, resting tone soft, fundus NT  Neurological - alert, oriented, normal speech, no focal findings or movement disorder noted, cranial nerves II through XII intact, motor and sensory grossly normal bilaterally  Musculoskeletal - full range of motion without pain  Extremities - no pedal edema noted    FHR =1 40 with mod variability  toco = no contractions    A/P pelvic pressure/discomfort at 37 weeks  Reassuring FHT  Reactive NST  No signs of labor  Pt has OP f/u on Wednesday with [facility]  Precautions reviewed  RCS scheduled for 39 weeks

## 2019-05-04 ENCOUNTER — Other Ambulatory Visit
Admission: RE | Admit: 2019-05-04 | Discharge: 2019-05-04 | Disposition: A | Discharge status: Home / Self Care | Payer: Medicaid Other | Source: Ambulatory Visit | Attending: Obstetrics & Gynecology | Admitting: Obstetrics & Gynecology

## 2019-05-04 ENCOUNTER — Ambulatory Visit (INDEPENDENT_AMBULATORY_CARE_PROVIDER_SITE_OTHER): Payer: Medicaid Other

## 2019-05-04 DIAGNOSIS — Z1159 Encounter for screening for other viral diseases: Secondary | ICD-10-CM

## 2019-05-04 LAB — VH APTIMA SARS-COV-2 ASSAY (PANTHER SYSTEM)(TM): Aptima SARS-CoV-2: NEGATIVE

## 2019-05-05 ENCOUNTER — Encounter: Payer: Self-pay | Admitting: Obstetrics & Gynecology

## 2019-05-05 DIAGNOSIS — Z3A39 39 weeks gestation of pregnancy: Secondary | ICD-10-CM

## 2019-05-07 ENCOUNTER — Inpatient Hospital Stay
Admission: RE | Admit: 2019-05-07 | Discharge: 2019-05-09 | DRG: 540 | Disposition: A | Discharge status: Home / Self Care | Payer: Medicaid Other | Attending: Obstetrics & Gynecology | Admitting: Obstetrics & Gynecology

## 2019-05-07 ENCOUNTER — Encounter
Admission: RE | Disposition: A | Discharge status: Home / Self Care | Payer: Self-pay | Source: Home / Self Care | Attending: Obstetrics & Gynecology

## 2019-05-07 ENCOUNTER — Inpatient Hospital Stay: Payer: Medicaid Other | Admitting: Anesthesiology

## 2019-05-07 DIAGNOSIS — Z8249 Family history of ischemic heart disease and other diseases of the circulatory system: Secondary | ICD-10-CM

## 2019-05-07 DIAGNOSIS — O99214 Obesity complicating childbirth: Secondary | ICD-10-CM | POA: Diagnosis present

## 2019-05-07 DIAGNOSIS — Z825 Family history of asthma and other chronic lower respiratory diseases: Secondary | ICD-10-CM

## 2019-05-07 DIAGNOSIS — Z3A39 39 weeks gestation of pregnancy: Secondary | ICD-10-CM

## 2019-05-07 DIAGNOSIS — O34211 Maternal care for low transverse scar from previous cesarean delivery: Principal | ICD-10-CM | POA: Diagnosis present

## 2019-05-07 DIAGNOSIS — Z98891 History of uterine scar from previous surgery: Secondary | ICD-10-CM

## 2019-05-07 LAB — CBC AND DIFFERENTIAL
Basophils %: 0.1 % (ref 0.0–3.0)
Basophils Absolute: 0 10*3/uL (ref 0.0–0.3)
Eosinophils %: 1.7 % (ref 0.0–7.0)
Eosinophils Absolute: 0.2 10*3/uL (ref 0.0–0.8)
Hematocrit: 37.9 % (ref 36.0–48.0)
Hemoglobin: 12.1 gm/dL (ref 12.0–16.0)
Lymphocytes Absolute: 2.1 10*3/uL (ref 0.6–5.1)
Lymphocytes: 18.6 % (ref 15.0–46.0)
MCH: 25 pg — ABNORMAL LOW (ref 28–35)
MCHC: 32 gm/dL (ref 32–36)
MCV: 77 fL — ABNORMAL LOW (ref 80–100)
MPV: 7.5 fL (ref 6.0–10.0)
Monocytes Absolute: 0.8 10*3/uL (ref 0.1–1.7)
Monocytes: 7.4 % (ref 3.0–15.0)
Neutrophils %: 72.3 % (ref 42.0–78.0)
Neutrophils Absolute: 8.1 10*3/uL (ref 1.7–8.6)
PLT CT: 346 10*3/uL (ref 130–440)
RBC: 4.92 10*6/uL (ref 3.80–5.00)
RDW: 15 % — ABNORMAL HIGH (ref 11.0–14.0)
WBC: 11.2 10*3/uL — ABNORMAL HIGH (ref 4.0–11.0)

## 2019-05-07 LAB — VH URINE DRUG SCREEN
Amphetamine: NEGATIVE
Barbiturates: NEGATIVE
Cannabinoids: NEGATIVE
Cocaine: NEGATIVE
Opiates: NEGATIVE
Phencyclidine: NEGATIVE
Urine Benzodiazepines: NEGATIVE
Urine Buprenorphine: NEGATIVE
Urine Creatinine Random: 80.25 mg/dL
Urine Ecstasy Screen: NEGATIVE
Urine Fentanyl Screen: NEGATIVE
Urine Methadone Screen: NEGATIVE
Urine Oxycodone: NEGATIVE
Urine Specific Gravity: 1.025 (ref 1.001–1.040)
pH, Urine: 5.6 pH (ref 5.0–8.0)

## 2019-05-07 LAB — TYPE AND SCREEN
AB Screen: NEGATIVE
ABO Rh: O POS

## 2019-05-07 SURGERY — Surgical Case
Anesthesia: Regional | Site: Abdomen | Laterality: Bilateral | Wound class: Clean Contaminated

## 2019-05-07 MED ORDER — SODIUM CHLORIDE (PF) 0.9 % IJ SOLN
3.0000 mL | Freq: Three times a day (TID) | INTRAMUSCULAR | Status: DC
Start: 2019-05-07 — End: 2019-05-09

## 2019-05-07 MED ORDER — LACTATED RINGERS IV BOLUS
1000.0000 mL | Freq: Once | INTRAVENOUS | Status: AC
Start: 2019-05-07 — End: 2019-05-07
  Administered 2019-05-07: 06:00:00 1000 mL via INTRAVENOUS

## 2019-05-07 MED ORDER — PRENATAL 27-0.8 MG PO TABS
1.0000 | ORAL_TABLET | Freq: Every day | ORAL | Status: DC
Start: 2019-05-07 — End: 2019-05-09
  Administered 2019-05-08: 08:00:00 1 via ORAL
  Filled 2019-05-07: qty 1

## 2019-05-07 MED ORDER — LACTATED RINGERS IV SOLN
INTRAVENOUS | Status: DC
Start: 2019-05-07 — End: 2019-05-07

## 2019-05-07 MED ORDER — VH CEFAZOLIN 3 G IN NS 100 ML IVPB (SIMPLE)
3.0000 g | INTRAVENOUS | Status: AC
Start: 2019-05-07 — End: 2019-05-07
  Administered 2019-05-07: 08:00:00 3 g via INTRAVENOUS
  Filled 2019-05-07: qty 100

## 2019-05-07 MED ORDER — HYDROMORPHONE HCL 0.5 MG/0.5 ML IJ SOLN
0.5000 mg | INTRAMUSCULAR | Status: DC | PRN
Start: 2019-05-07 — End: 2019-05-09

## 2019-05-07 MED ORDER — LACTATED RINGERS IV SOLN
125.0000 mL/h | INTRAVENOUS | Status: AC
Start: 2019-05-07 — End: 2019-05-08
  Administered 2019-05-07 (×2): 125 mL/h via INTRAVENOUS

## 2019-05-07 MED ORDER — VH OXYTOCIN INFUSION 20 UNITS/1000 ML NS (POST PARTUM)
INTRAVENOUS | Status: DC | PRN
Start: 2019-05-07 — End: 2019-05-07
  Administered 2019-05-07: 125 mL/h via INTRAVENOUS

## 2019-05-07 MED ORDER — ONDANSETRON HCL 4 MG/2ML IJ SOLN
INTRAMUSCULAR | Status: AC
Start: 2019-05-07 — End: ?
  Filled 2019-05-07: qty 2

## 2019-05-07 MED ORDER — ACETAMINOPHEN 10 MG/ML IV SOLN
INTRAVENOUS | Status: DC | PRN
Start: 2019-05-07 — End: 2019-05-07
  Administered 2019-05-07: 1000 mg via INTRAVENOUS

## 2019-05-07 MED ORDER — VH OXYTOCIN INFUSION 20 UNITS/1000 ML NS (POST PARTUM)
7.5000 [IU]/h | INTRAVENOUS | Status: AC
Start: 2019-05-07 — End: 2019-05-07

## 2019-05-07 MED ORDER — FENTANYL CITRATE (PF) 50 MCG/ML IJ SOLN (WRAP)
25.0000 ug | INTRAMUSCULAR | Status: DC | PRN
Start: 2019-05-07 — End: 2019-05-09

## 2019-05-07 MED ORDER — EPHEDRINE SULFATE 50 MG/ML IJ/IV SOLN (WRAP)
Status: AC
Start: 2019-05-07 — End: ?
  Filled 2019-05-07: qty 1

## 2019-05-07 MED ORDER — KETOROLAC TROMETHAMINE 15 MG/ML IJ SOLN
15.0000 mg | Freq: Four times a day (QID) | INTRAMUSCULAR | Status: DC | PRN
Start: 2019-05-07 — End: 2019-05-09
  Administered 2019-05-07 – 2019-05-08 (×2): 15 mg via INTRAVENOUS
  Filled 2019-05-07 (×3): qty 1

## 2019-05-07 MED ORDER — DEXAMETHASONE SODIUM PHOSPHATE 4 MG/ML IJ SOLN
INTRAMUSCULAR | Status: AC
Start: 2019-05-07 — End: ?
  Filled 2019-05-07: qty 1

## 2019-05-07 MED ORDER — DIPHENHYDRAMINE HCL 50 MG/ML IJ SOLN
25.0000 mg | INTRAMUSCULAR | Status: DC | PRN
Start: 2019-05-07 — End: 2019-05-09
  Administered 2019-05-07: 23:00:00 25 mg via INTRAVENOUS
  Filled 2019-05-07: qty 1

## 2019-05-07 MED ORDER — MISOPROSTOL 200 MCG PO TABS
1000.0000 ug | ORAL_TABLET | Freq: Once | ORAL | Status: DC | PRN
Start: 2019-05-07 — End: 2019-05-09

## 2019-05-07 MED ORDER — PROMETHAZINE HCL 12.5 MG RE SUPP
12.5000 mg | Freq: Four times a day (QID) | RECTAL | Status: DC | PRN
Start: 2019-05-07 — End: 2019-05-09

## 2019-05-07 MED ORDER — PROMETHAZINE HCL 25 MG PO TABS
25.0000 mg | ORAL_TABLET | Freq: Four times a day (QID) | ORAL | Status: DC | PRN
Start: 2019-05-07 — End: 2019-05-09
  Administered 2019-05-07: 12:00:00 25 mg via ORAL
  Filled 2019-05-07: qty 1

## 2019-05-07 MED ORDER — METOCLOPRAMIDE HCL 5 MG/ML IJ SOLN
10.0000 mg | Freq: Once | INTRAMUSCULAR | Status: DC | PRN
Start: 2019-05-07 — End: 2019-05-09

## 2019-05-07 MED ORDER — FENTANYL CITRATE (PF) 50 MCG/ML IJ SOLN (WRAP)
INTRAMUSCULAR | Status: DC | PRN
Start: 2019-05-07 — End: 2019-05-07
  Administered 2019-05-07: 10 ug via INTRATHECAL

## 2019-05-07 MED ORDER — MORPHINE SULFATE (PF) 0.5 MG/ML IJ SOLN
INTRAMUSCULAR | Status: DC | PRN
Start: 2019-05-07 — End: 2019-05-07
  Administered 2019-05-07: .15 mg via INTRATHECAL

## 2019-05-07 MED ORDER — CALCIUM CARBONATE ANTACID 500 MG PO CHEW
1000.0000 mg | CHEWABLE_TABLET | Freq: Three times a day (TID) | ORAL | Status: DC | PRN
Start: 2019-05-07 — End: 2019-05-09

## 2019-05-07 MED ORDER — HALOPERIDOL LACTATE 5 MG/ML IJ SOLN
0.5000 mg | Freq: Once | INTRAMUSCULAR | Status: DC | PRN
Start: 2019-05-07 — End: 2019-05-09
  Filled 2019-05-07: qty 1

## 2019-05-07 MED ORDER — MEPERIDINE HCL 25 MG/ML IJ SOLN
12.5000 mg | Freq: Once | INTRAMUSCULAR | Status: DC | PRN
Start: 2019-05-07 — End: 2019-05-09

## 2019-05-07 MED ORDER — ONDANSETRON HCL 4 MG/2ML IJ SOLN
INTRAMUSCULAR | Status: DC | PRN
Start: 2019-05-07 — End: 2019-05-07
  Administered 2019-05-07: 4 mg via INTRAVENOUS

## 2019-05-07 MED ORDER — ONDANSETRON HCL 4 MG/2ML IJ SOLN
4.0000 mg | Freq: Three times a day (TID) | INTRAMUSCULAR | Status: DC | PRN
Start: 2019-05-07 — End: 2019-05-09
  Filled 2019-05-07: qty 2

## 2019-05-07 MED ORDER — VH PHENYLEPHRINE 120 MCG/ML IV BOLUS (ANESTHESIA)
PREFILLED_SYRINGE | INTRAVENOUS | Status: AC
Start: 2019-05-07 — End: ?
  Filled 2019-05-07: qty 20

## 2019-05-07 MED ORDER — ONDANSETRON 4 MG PO TBDP
4.0000 mg | ORAL_TABLET | Freq: Three times a day (TID) | ORAL | Status: DC | PRN
Start: 2019-05-07 — End: 2019-05-09

## 2019-05-07 MED ORDER — SODIUM CHLORIDE (PF) 0.9 % IJ SOLN
3.0000 mL | Freq: Three times a day (TID) | INTRAMUSCULAR | Status: DC
Start: 2019-05-08 — End: 2019-05-09

## 2019-05-07 MED ORDER — SODIUM CHLORIDE (PF) 0.9 % IJ SOLN
0.1000 mg | INTRAMUSCULAR | Status: DC | PRN
Start: 2019-05-07 — End: 2019-05-09

## 2019-05-07 MED ORDER — PROCHLORPERAZINE EDISYLATE 10 MG/2ML IJ SOLN
5.0000 mg | Freq: Once | INTRAMUSCULAR | Status: DC | PRN
Start: 2019-05-07 — End: 2019-05-09

## 2019-05-07 MED ORDER — KETOROLAC TROMETHAMINE 30 MG/ML IJ SOLN
INTRAMUSCULAR | Status: DC | PRN
Start: 2019-05-07 — End: 2019-05-07
  Administered 2019-05-07: 30 mg via INTRAVENOUS

## 2019-05-07 MED ORDER — PROCHLORPERAZINE EDISYLATE 10 MG/2ML IJ SOLN
5.0000 mg | INTRAMUSCULAR | Status: DC | PRN
Start: 2019-05-07 — End: 2019-05-09
  Administered 2019-05-07: 15:00:00 5 mg via INTRAVENOUS
  Filled 2019-05-07: qty 2

## 2019-05-07 MED ORDER — MEASLES, MUMPS & RUBELLA VAC IJ SOLR
0.5000 mL | INTRAMUSCULAR | Status: DC | PRN
Start: 2019-05-07 — End: 2019-05-09

## 2019-05-07 MED ORDER — OXYCODONE-ACETAMINOPHEN 5-325 MG PO TABS
1.0000 | ORAL_TABLET | ORAL | Status: DC | PRN
Start: 2019-05-07 — End: 2019-05-09
  Filled 2019-05-07: qty 1

## 2019-05-07 MED ORDER — FENTANYL CITRATE (PF) 50 MCG/ML IJ SOLN (WRAP)
INTRAMUSCULAR | Status: AC
Start: 2019-05-07 — End: ?
  Filled 2019-05-07: qty 2

## 2019-05-07 MED ORDER — IBUPROFEN 600 MG PO TABS
600.0000 mg | ORAL_TABLET | Freq: Four times a day (QID) | ORAL | Status: DC | PRN
Start: 2019-05-07 — End: 2019-05-09
  Administered 2019-05-09: 01:00:00 600 mg via ORAL
  Filled 2019-05-07: qty 1

## 2019-05-07 MED ORDER — OXYCODONE HCL 5 MG PO TABS
5.0000 mg | ORAL_TABLET | Freq: Once | ORAL | Status: DC | PRN
Start: 2019-05-07 — End: 2019-05-09

## 2019-05-07 MED ORDER — ACETAMINOPHEN 10 MG/ML IV SOLN
INTRAVENOUS | Status: AC
Start: 2019-05-07 — End: ?
  Filled 2019-05-07: qty 100

## 2019-05-07 MED ORDER — OXYTOCIN 10 UNIT/ML IJ SOLN
INTRAMUSCULAR | Status: AC
Start: 2019-05-07 — End: ?
  Filled 2019-05-07: qty 2

## 2019-05-07 MED ORDER — BUPIVACAINE HCL (PF) 0.75 % IJ SOLN
INTRAMUSCULAR | Status: DC | PRN
Start: 2019-05-07 — End: 2019-05-07
  Administered 2019-05-07: 1.3 mL via INTRASPINAL

## 2019-05-07 MED ORDER — ENOXAPARIN SODIUM 40 MG/0.4ML SC SOLN
40.0000 mg | SUBCUTANEOUS | Status: DC
Start: 2019-05-07 — End: 2019-05-09
  Administered 2019-05-07 – 2019-05-08 (×2): 40 mg via SUBCUTANEOUS
  Filled 2019-05-07 (×3): qty 0.4

## 2019-05-07 MED ORDER — VH PHENYLEPHRINE 120 MCG/ML IV BOLUS (ANESTHESIA)
PREFILLED_SYRINGE | INTRAVENOUS | Status: DC | PRN
Start: 2019-05-07 — End: 2019-05-07
  Administered 2019-05-07 (×3): 60 ug via INTRAVENOUS
  Administered 2019-05-07: 120 ug via INTRAVENOUS
  Administered 2019-05-07: 60 ug via INTRAVENOUS

## 2019-05-07 MED ORDER — TETANUS-DIPHTH-ACELL PERTUSSIS 5-2.5-18.5 LF-MCG/0.5 IM SUSP
0.5000 mL | INTRAMUSCULAR | Status: DC | PRN
Start: 2019-05-07 — End: 2019-05-09

## 2019-05-07 MED ORDER — MORPHINE SULFATE (PF) 0.5 MG/ML IJ SOLN
INTRAMUSCULAR | Status: AC
Start: 2019-05-07 — End: ?
  Filled 2019-05-07: qty 10

## 2019-05-07 MED ORDER — DOCUSATE SODIUM 100 MG PO CAPS
200.0000 mg | ORAL_CAPSULE | Freq: Two times a day (BID) | ORAL | Status: DC
Start: 2019-05-07 — End: 2019-05-09
  Administered 2019-05-07 – 2019-05-08 (×2): 200 mg via ORAL
  Filled 2019-05-07 (×2): qty 2

## 2019-05-07 MED ORDER — DEXAMETHASONE SODIUM PHOSPHATE 4 MG/ML IJ SOLN (WRAP)
INTRAMUSCULAR | Status: DC | PRN
Start: 2019-05-07 — End: 2019-05-07
  Administered 2019-05-07: 4 mg via INTRAVENOUS

## 2019-05-07 MED ORDER — ONDANSETRON HCL 4 MG/2ML IJ SOLN
4.0000 mg | Freq: Once | INTRAMUSCULAR | Status: DC | PRN
Start: 2019-05-07 — End: 2019-05-09

## 2019-05-07 SURGICAL SUPPLY — 20 items
BANDAID EXTRA LARGE (Dressings) ×2 IMPLANT
BLADE CLIPPER (Supply) ×2 IMPLANT
CHLORAPREP W/ORANGE TINT 26ML (Supply) ×2 IMPLANT
DRAPE HALF (Supply) ×2 IMPLANT
DRSG TELFA ISLAND 3 X 8 (Dressings) ×2 IMPLANT
GLOVE BIOGEL UND PI IND SZ 6.5 (Supply) ×2 IMPLANT
GLOVE BIOGEL UT M PI SUR SZ6.5 (Supply) ×2 IMPLANT
GLOVE SPECTEC STERILE MED LF (Supply) ×2 IMPLANT
PACK C-SECTION (Supply) ×2 IMPLANT
PAD ABD STERILE 8 X 10 (Dressings) ×2 IMPLANT
RETRACTOR C-SECT ALEXIS O LG (Supply) ×2 IMPLANT
SLEEVE SCD KNEE #5329 (Supply) ×2 IMPLANT
SOL SALINE IRRIG 500ML (Solutions) ×2 IMPLANT
SPONGE ULTRAFOAM 100 (Medication) ×2 IMPLANT
STERISTRIP 1/2 IN X 4 IN (Supply) ×2 IMPLANT
SUT MONOCRYL 3-0 UNDYED Y523H (Supply) ×2 IMPLANT
SUT PLAIN GUT 2-0 853H (Supply) ×2 IMPLANT
SUT VICRYL 0 J358H (Supply) ×8 IMPLANT
TRAY URINEMETER FOLEY LATEXFRE (TDC (Tubes, Draines, Catheters)) ×1
TRAY URINEMETER FOLEY LATEXFRE (TDC (Tubes, Drains, Catheters)) ×1 IMPLANT

## 2019-05-07 NOTE — Plan of Care (Signed)
Problem: Vaginal/Cesarean Delivery  Goal: Maternal Status within defined parameters  Outcome: Progressing  Flowsheets (Taken 05/07/2019 0723)  Maternal status with defined parameters:   Monitor/assess vital signs   Manage complications/co-morbidities per LIP orders   Perform physical assessment per phase of care  Goal: Evidence of Fetal Well Being  Outcome: Progressing  Flowsheets (Taken 05/07/2019 0723)  Evidence of fetal well being:   Monitor/assess fetal heart rate. Notify LIP of Category II or III EFM tracings.   Initiate interventions for Category II or III EFM strip   Position patient for maximum uterine perfusion   Patient's position should support labor progress and expulsion efforts  Goal: Intrapartum management of pain/discomfort  Outcome: Progressing  Flowsheets (Taken 05/07/2019 0723)  Intrapartum management of pain/discomfort:   Keep pain at acceptable level for patient   Assess pain using a consistent, developmental/age appropriate pain scale   Assess pain level before and following intervention   Monitor hemodynamic parameters in response to pain/sedation medications   Assess and manage side effects associated with pain management   Include patient/patient care companion in decisions related to pain management   Support patient for natural childbirth with non-pharmacologic pain management or with desired pharmacologic management such as epidural   Report ineffective pain management to LIP   Consult/collaborate with Anesthesia or Pain Service

## 2019-05-07 NOTE — Anesthesia Procedure Notes (Signed)
Combined Spinal epidural    Patient location during procedure: L&D  Reason for block: Labor or C-section  Requested by Surgeon: Yes   Start time: 05/07/2019 7:39 AM  End time: 05/07/2019 7:54 AM    Staffing  Anesthesiologist: Kristen Loader, MD  Performed: anesthesiologist     Pre-procedure Checklist   Completed: patient identified, site marked, surgical consent, pre-op evaluation, timeout performed, risks and benefits discussed, monitors and equipment checked and correct site  Timeout Completed:  05/07/2019 7:39 AM    CSE  Patient monitoring: Pulse oximetry, NIBP and EKG  Premedication: No  Patient position: sitting  Skin Local: lidocaine 1%    Number of attempts: 1                    Successful attempt  Interspace: L4-5  Approach: midline      Needle Placement  Needle type: Touhy needle   Needle gauge: 17  Injection technique: LOR saline  CSF Return: No  Blood Return: No    Needle Gauge 25 G (5inch)  CSF Return: Yes   Blood Return:No  Paresthesia Pain: no    Catheter Placement   Catheter size: 19 G  Catheter at skin depth: 13 cm  CSF Return: No          No Catheter IV/SA Signs or Symptoms    Assessment   Sensory level: T4  Patient tolerated procedure well: Yes  Block Outcome: no complications and successful block

## 2019-05-07 NOTE — Op Note (Signed)
OPERATIVE NOTE    Date of Operation: 05/07/2019    Pre-operative Diagnosis: 28yo G2P1001 @ 39.[redacted] weeks Gestational Age, prior cesarean section, declines TOLAC     Patient Active Problem List   Diagnosis   . History of cesarean section   . History of postpartum hemorrhage   . Maternal morbid obesity, antepartum   . BMI 50.0-59.9, adult   . Pregnancy with 39 completed weeks gestation   . History of cesarean delivery       Post-operative Diagnosis: same    Procedure: repeat Cesarean Delivery    Surgeon: Truddie Crumble, MD    Assistant Surgeon: Marjean Donna, CST    Anesthesia: Spinal anesthesia     Anesthesiologist: Kristen Loader, MD    Findings: Live Female infant, Apgars 8 at 1 min, and 9 at 5 min, Weight 3771g, grossly normal uterus, fallopian tubes, and ovaries with thin lower uterine segment    Estimated Blood Loss: 600 ml           Drains:  Foley placed and left to continuous drainage.                  Specimen: none           Complications:  None; patient tolerated the procedure well.           Procedure:  The informed consent process was undertaken and a signed consent was placed onto the chart. The patient was brought to the operating room where she received spinal anesthesia and was placed in the dorsal supine position with a slight leftward tilt. The patient was prepped and draped in the usual sterile fashion and a foley urinary catheter was placed into the bladder.  A time out procedure was performed via the standard protocol.    The skin was tested with an allis clamp and anesthesia was found to be adequate.  A Pfannenstiel incision was made with a scalpel.  The incision was carried down to the fascia with the bovee.  The fascia was incised in the midline with the bovee and the incision carried bilaterally with Mayo scissors.  The inferior aspect of the fascial incision was grasped with kochers and elevated and the rectus muscles were dissected off the fascia bluntly and sharply in the midline with  Mayo scissors.  Attention was then paid to the superior aspect of the fascial incision which was again grasped with kocher clamps and elevated, the underlying rectus muscle again dissected off bluntly and sharply in the midline with Mayo scissors.  The rectus muscles were separated sharply in the midline and the peritoneum was entered bluntly.  The peritoneum was stretched bluntly.     The lower uterine segment was then easily visualized with the aid of the Alexis retractor.  The lower uterine segment was noted to be very thin and transparent.  A bladder flap was created with metzenbaums and pickups and the bladder blade replaced.  The lower uterine segment was incised in a curvilinear fashion with a number 10 scalpel. The intrauterine cavity was entered bluntly and the  incision was then extended with cephalad/caudal pressure.  Clear amniotic fluid was noted. The baby was identified in a cephalic presentation and the baby's head was delivered onto the mother's abdomen without difficulty.  The remainder of the baby was then delivered without difficulty with the aid of fundal pressure.  The cord was then clamped and cut and the baby was passed off to the attendant pediatric staff. The placenta  was expressed and found to be intact with a 3 vessel cord.     The uterus was exteriorized, cleared of clots and debris, and the margins of the uterine incision were identified with Ring clamps. The uterine incision was closed with a running locked number 0 Vicryl. A second imbricating layer of the identical suture was then placed over the first. Attention was then turned to the adnexa.  The tubes and ovaries were examined and found to be grossly normal.  The uterus was returned to the abdominal cavity.     The paracolic gutters were cleared of clots and debris. The uterine incision was visualized in situ and noted to be hemostatic.  The bladder and rectus muscles were examined and found to be hemostatic.  Any small bleeders  noted were stopped with the bovee.  The fascia was closed in a running non-locked fashion with 0 Vicryl. The subcutaneous tissue was irrigated.  Electrocautery was then  perfomed on a few small subcutaneous bleeders. The subcutaneous tissue was then reapproximated with 2-0 Plain gut in a running, non-locked fashion. The skin was then reapproximated with a running subcuticular 4-0 Vicryl with Steri Strips subsequently applied over the incision. The wound was appropriately dressed and the patient was brought to the recovery room in stable condition. Clear/Yellow urine was noted in her foley catheter at the end of the procedure.  All sponge, needle, and instrument counts were correct at the end of the procedure times three.    Truddie Crumble, MD

## 2019-05-07 NOTE — H&P (Signed)
OBSTETRICS - ANTEPARTUM ADMISSION H&P    05/07/2019 7:23 AM    Kristi Branch is a 28 y.o.G2P1001 at [redacted]w[redacted]d gestation by LMP consistent with 24.5 wk ultrasound.  She is being admitted today for a scheduled repeat cesarean delivery, TOLAC declined. Patient notes no complaints; denies LOF, VB, UCs, decreased FM and recent illness. The patient reports that she has had benign prenatal care with [facility]. Available prenatal care records reviewed by myself.    Patient Active Problem List   Diagnosis   . History of cesarean section   . History of postpartum hemorrhage   . Maternal morbid obesity, antepartum   . BMI 50.0-59.9, adult   . Pregnancy with 39 completed weeks gestation   . History of cesarean delivery     LMP: 08/04/18  Dating Ultrasound: 02/04/19 @24 .[redacted] wks GA  Final EDD: Estimated Date of Delivery: 05/22/19    Past Medical History:   Diagnosis Date   . Anemia    . 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 Treated in ED.   [facility] Late prenatal care     32 weeks   . Obesity          Past Surgical History:  Past Surgical History:   Procedure Laterality Date   . CESAREAN SECTION N/A 08/07/2015    Procedure: CESAREAN SECTION;  Surgeon: Levander Campion, MD;  Location: [facility];  Service: Obstetrics;  Laterality: N/A;       Family History:  Family History   Problem Relation Age of Onset   . COPD Mother    . Asthma Mother    . Hypertension Paternal Grandmother    . No known problems Father        Social History:  reports that she has never smoked. She has never used smokeless tobacco. She reports that she does not drink alcohol or use drugs.    Past Gynecologic History: None.    OB History   Gravida Para Term Preterm AB Living   2 1 1  0 0 1   SAB TAB Ectopic Multiple Live Births   0 0 0 0 1      # Outcome Date GA Lbr Len/2nd Weight Sex Delivery Anes PTL Lv   2 Current            1 Term 08/07/15 [redacted]w[redacted]d  4.085 kg (9 lb 0.1 oz) M CSECT EPI, Spinal N LIV     Medications Prior to Admission   Medication Sig  Dispense Refill Last Dose   . ferrous sulfate 325 (65 FE) MG tablet Take 325 mg by mouth nightly   05/06/2019 at Unknown time   . Prenatal Vit-Fe Fumarate-FA (PRENATAL VITAMINS PO) Take by mouth   05/06/2019 at Unknown time       Allergies: No Known Allergies    Review of Systems:  A comprehensive review of systems was:  Negative    Physical Exam:  Patient Vitals for the past 24 hrs:   BP Temp Temp src Pulse Resp Height Weight   05/07/19 0606 (!) 142/96 98.3 F (36.8 C) Oral 83 18 1.6 m (5\' 3" ) 138.2 kg (304 lb 9.6 oz)     FHTs: 130, Variability: moderate  Pattern: accelerations  UCs: none on external tocometer  Cx: deferred.    General: No apparent distress; appears comfortable.  Abdomen: Gravid, NT with appropriate FH. EFW of 7 1/2 to 8 pounds by abdominal palpation. Cephalic by Leoplod's manuevers.  Back: No  CVAT, atraumatic, no point tenderness.  Extremities: No cyanosis or erythema. Mild bilateral LE edema. Symmetric. No calf pain 2+ DTRs.  Vulva/Vagina: deferred    Prenatal Care Labs:  Lab Results   Component Value Date    ABORH O Positive 05/07/2019    HEPBSAG Negative 03/09/2019    GBS Negative 04/15/2019    RPR Nonreactive 03/09/2019    RUBELLAABIGG immune 03/09/2019     Recent Labs:   Recent Labs   Lab 05/07/19  0606   WBC 11.2*   Hemoglobin 12.1   PLT CT 346     Results     Procedure Component Value Units Date/Time    Type and Screen [[id number]] Collected:  05/07/19 0606    Specimen:  Blood Updated:  05/07/19 0716     ABO Rh O Positive     AB Screen NEGATIVE    Urine Drug Screen [[id number]] Collected:  05/07/19 0600    Specimen:  Urine, Random Updated:  05/07/19 0646     Urine Creatinine Random 80.25 mg/dL      pH, Urine 5.6 pH      Urine Specific Gravity 1.025     Amphetamine Negative     Barbiturates Negative     Benzodiazepines Negative     Cannabinoids Negative     Cocaine Negative     Urine Methadone Screen Negative     Opiates Negative     Urine Oxycodone Negative     Phencyclidine Negative     Urine  Buprenorphine Negative     Urine Ecstasy Screen Negative     Urine Fentanyl Screen Negative    CBC and differential [[id number]]  (Abnormal) Collected:  05/07/19 0606    Specimen:  Blood Updated:  05/07/19 0615     WBC 11.2 K/cmm      RBC 4.92 M/cmm      Hemoglobin 12.1 gm/dL      Hematocrit 08.6 %      MCV 77 fL      MCH 25 pg      MCHC 32 gm/dL      RDW 57.8 %      PLT CT 346 K/cmm      MPV 7.5 fL      Neutrophils % 72.3 %      Lymphocytes 18.6 %      Monocytes 7.4 %      Eosinophils % 1.7 %      Basophils % 0.1 %      Neutrophils Absolute 8.1 K/cmm      Lymphocytes Absolute 2.1 K/cmm      Monocytes Absolute 0.8 K/cmm      Eosinophils Absolute 0.2 K/cmm      Basophils Absolute 0.0 K/cmm     GBS Transcribed [[id number]] Resulted:  04/15/19      Updated:  05/07/19 0559     GBS Transcribed Negative          A/P:  1. Term IUP in 28 y.o.G2P1001 at [redacted]w[redacted]d gestation.  2. Admitted for a scheduled repeat cesarean delivery, TOLAC declined.  The repeat cesarean section procedure was reviewed with the patient including but not limited to risks of bleeding, infection, injury to surrounding organs (bowel, bladder, blood vessels), need for further surgery, and risks of anesthesia.  All of her questions were answered and signed surgical consents were placed in her chart.  NPO, IVFs, SCDs, 3g Kefzol for preoperative antibiotics.  3. NST reactive - CEFM/Toco.  4. GBS negative.  Truddie Crumble, MD

## 2019-05-07 NOTE — Plan of Care (Signed)
Problem: Vaginal/Cesarean Delivery  Goal: Maternal Status within defined parameters  Outcome: Progressing  Goal: Evidence of Fetal Well Being  Outcome: Progressing  Goal: Intrapartum management of pain/discomfort  Outcome: Progressing

## 2019-05-07 NOTE — Anesthesia Preprocedure Evaluation (Signed)
Anesthesia Evaluation    AIRWAY    Mallampati: III    TM distance: >3 FB  Neck ROM: full  Mouth Opening:full   CARDIOVASCULAR    cardiovascular exam normal       DENTAL    no notable dental hx     PULMONARY    pulmonary exam normal     OTHER FINDINGS    BMI 53    Lab             02/04/19     05/07/19                       0917          0606          HGB          12.2         12.1          HCT          35.6*        37.9          PLT          332          346           CREAT        0.65          --           K            4.0           --         ]                  Relevant Problems   NEURO/PSYCH   (+) History of postpartum hemorrhage               Anesthesia Plan    ASA 3     CSE               (Risks, benefits and alternatives discussed of neuraxial anesthesia, including failed block, post-dural puncture headache, infection, bleeding, nerve injury, nausea, pain, shivering, blood pressure changes, itching, conversion to general anesthesia, high spinal block and local anesthesia toxicity. Questions answered, pt accepts.    Risks, benefits and alternatives discussed of general anesthesia, including airway issues, aspiration,  nausea, pain, cardiac and or neurologic issues, prolonged mechanical ventilation , ICU stay, damage to teeth and eyes. Questions answered. Pt or guardian accepts.)                  informed consent obtained      pertinent labs reviewed             Signed by: Kristen Loader 05/07/19 7:04 AM

## 2019-05-07 NOTE — UM Notes (Addendum)
[employer]    [facility]  Utilization Intake Form    +++MOM AND BABY [place] [id number]+++                    Date:  191478                   AUTHORIZATION #__________________________________     Patient type (circle) Inpatient   Admission Type:  Inpatient    Patient Name:  Kristi Branch    Address:  [address] [place] [address]    Telephone#: [phone]    Medicaid #:  [id number]  SS#:  [id number]  DOB:  [birth year]    Admitting MD: Fulton Reek, MD     Admit Date:  071020    Hospital:  [facility], [address], [place]  [address]    Diagnosis:  IUP    Procedure:  C/S    [facility] Delivery Date:   071020 Sex: (circle)  Female    Baby's Name:   AHMIYA ABEE    Weight:  8 lbs 5 oz,  Apgar:  8/9, EGA:  39 wks 3 days, Grams:  3771    Pediatrician:  Paulette Blanch, MD    Contact Person:  Clayton Bibles, RN   Telephone #:  [phone]  Fax#:  [phone]      Fax completed form to [facility] at [phone]  To contact [employer] Staff for Demographics at [phone]

## 2019-05-07 NOTE — Transfer of Care (Signed)
Anesthesia Transfer of Care Note    Patient: Kristi Branch    Last vitals:   Vitals:    05/07/19 0606   BP: (!) 142/96   Pulse: 83   Resp: 18   Temp: 36.8 C (98.3 F)       Oxygen: Room Air     Mental Status:awake    Airway: Natural    Cardiovascular Status:  stable

## 2019-05-07 NOTE — Plan of Care (Signed)
Problem: Vaginal/Cesarean Delivery  Goal: Maternal Status within defined parameters  05/07/2019 0934 by Army Fossa, RN  Outcome: Completed  05/07/2019 0723 by Army Fossa, RN  Outcome: Progressing  Flowsheets (Taken 05/07/2019 [phone])  Maternal status with defined parameters:   Monitor/assess vital signs   Manage complications/co-morbidities per LIP orders   Perform physical assessment per phase of care  Goal: Evidence of Fetal Well Being  05/07/2019 0934 by Army Fossa, RN  Outcome: Completed  05/07/2019 0723 by Army Fossa, RN  Outcome: Progressing  Flowsheets (Taken 05/07/2019 [phone])  Evidence of fetal well being:   Monitor/assess fetal heart rate. Notify LIP of Category II or III EFM tracings.   Initiate interventions for Category II or III EFM strip   Position patient for maximum uterine perfusion   Patient's position should support labor progress and expulsion efforts  Goal: Intrapartum management of pain/discomfort  05/07/2019 0934 by Army Fossa, RN  Outcome: Completed  05/07/2019 0723 by Army Fossa, RN  Outcome: Progressing  Flowsheets (Taken 05/07/2019 [phone])  Intrapartum management of pain/discomfort:   Keep pain at acceptable level for patient   Assess pain using a consistent, developmental/age appropriate pain scale   Assess pain level before and following intervention   Monitor hemodynamic parameters in response to pain/sedation medications   Assess and manage side effects associated with pain management   Include patient/patient care companion in decisions related to pain management   Support patient for natural childbirth with non-pharmacologic pain management or with desired pharmacologic management such as epidural   Report ineffective pain management to LIP   Consult/collaborate with Anesthesia or Pain Service

## 2019-05-07 NOTE — Consults (Signed)
[facility]  [address]  [place] [address]    Consult Note  Women's and Children's Services  Case Management    Reason for Consult:    Late Prenatal Care      Consult Note:     RNCM met with patient at bedside. MOB has her cousin at bedside as her support person. Infant was sleeping in bassinet. The patient lives with her grandmother and her older son who is 54.30 years of age. Her sister, brother-in-law and nephew also live in the home. MOB drives and reports her family can assist her if needed. FOB currently lives in [place]. MOB is enrolled in [facility] and has food stamps. She reports being able to meet her family's food needs with that assistance. She also reports having all needed baby care items including car seat, bassinet, diapers, wipes, and clothing. The infant's pediatrician will be Dr. Kathryne Gin.     MOB received her [facility]. She reports she was late to care due to not knowing she was pregnant initially. MOB was living in [place] with FOB at the time of conception. She reports that by the time she realized she was pregnant, moved back home, and got Medicaid established, and got an appointment at the practice, she was well into her pregnancy. MOB denies any barriers currently that would prevent her from getting follow up after discharge for either herself or her infant.     MOB is interested in participating with Healthy Families. RNCM will follow up with MOB next week for virtual parent survey. RNCM provided to MOB at bedside a community resource list for her area of residence. MOB denies additional questions or concerns for RNCM at the time of this consult.     Suzan Slick Dorthy Cooler BSN, RN  Geophysicist/field seismologist. [phone]

## 2019-05-07 NOTE — UM Notes (Signed)
[facility] Utilization Management Review Sheet    Facility :  [facility]    NAME: Kristi Branch  MR#: [id number]    CSN#: [id number]    ROOM: 209/209-A AGE: 28 y.o.    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 Treated in ED.    Late prenatal care     32 weeks    Obesity        DATE OF REVIEW: 05/07/2019    VITALS: BP 145/89    Pulse 70    Temp 97.5 F (36.4 C) (Temporal)    Resp 20    Ht 1.6 m (5\' 3" )    Wt 138.2 kg (304 lb 9.6 oz)    SpO2 99%    Breastfeeding Unknown    BMI 53.96 kg/m     Active Hospital Problems    Diagnosis    History of cesarean delivery    Pregnancy with 39 completed weeks gestation     071020:  Admitted for repeat C/S.  G2P2  [facility]  [id number]  C/S on 071020 @ 8:20 am  Female 8 lbs 5 oz 3771 gms  Apgar:  8/9  Gestation:  39 wks 3 days     MCG:  S-350    Rush Barer. Eldor Conaway, RN   Utilization Review Nurse  Care Management   [facility]  [address]  [place] [address]  Ph: [phone]  F:  [phone]  [email or web address]

## 2019-05-07 NOTE — Anesthesia Postprocedure Evaluation (Signed)
Anesthesia Post Evaluation    Patient: Kristi Branch    Procedure(s):  CESAREAN SECTION, REPEAT    Anesthesia type: CSE    Last Vitals:   Vitals Value Taken Time   BP 128/80 05/07/2019  9:18 AM   Temp 36.4 C (97.5 F) 05/07/2019  9:18 AM   Pulse 75 05/07/2019  9:18 AM   Resp 15 05/07/2019  9:18 AM   SpO2 99 % 05/07/2019  9:18 AM                 Anesthesia Post Evaluation:     Patient Evaluated: bedside  Patient Participation: complete - patient participated  Level of Consciousness: awake and alert  Pain Score: 0  Pain Management: satisfactory to patient  Multimodal analgesia pain management approach    Airway Patency: patent    Anesthetic complications: No      PONV Status: none    Cardiovascular status: acceptable  Respiratory status: acceptable  Hydration status: acceptable        Signed by: Kristen Loader, 05/07/2019 9:18 AM

## 2019-05-08 LAB — CBC AND DIFFERENTIAL
Basophils %: 0.1 % (ref 0.0–3.0)
Basophils Absolute: 0 10*3/uL (ref 0.0–0.3)
Eosinophils %: 0.9 % (ref 0.0–7.0)
Eosinophils Absolute: 0.1 10*3/uL (ref 0.0–0.8)
Hematocrit: 29.7 % — ABNORMAL LOW (ref 36.0–48.0)
Hemoglobin: 9.8 gm/dL — ABNORMAL LOW (ref 12.0–16.0)
Lymphocytes Absolute: 2.4 10*3/uL (ref 0.6–5.1)
Lymphocytes: 19 % (ref 15.0–46.0)
MCH: 26 pg — ABNORMAL LOW (ref 28–35)
MCHC: 33 gm/dL (ref 32–36)
MCV: 78 fL — ABNORMAL LOW (ref 80–100)
MPV: 7.5 fL (ref 6.0–10.0)
Monocytes Absolute: 1 10*3/uL (ref 0.1–1.7)
Monocytes: 7.6 % (ref 3.0–15.0)
Neutrophils %: 72.4 % (ref 42.0–78.0)
Neutrophils Absolute: 9.2 10*3/uL — ABNORMAL HIGH (ref 1.7–8.6)
PLT CT: 270 10*3/uL (ref 130–440)
RBC: 3.81 10*6/uL (ref 3.80–5.00)
RDW: 14.9 % — ABNORMAL HIGH (ref 11.0–14.0)
WBC: 12.7 10*3/uL — ABNORMAL HIGH (ref 4.0–11.0)

## 2019-05-08 NOTE — Lactation Note (Signed)
In to see couplet for ordered lactation consult.     This patient is an experienced mother however reports she was unsuccessful with breastfeeding her [age] year old.  She reports infant nurses well but does not sustain the latch and fall asleep. I reviewed these behviors as somewhat normal in the first 24 hours. We attempted to latch infant but infant difficult to rouse for feeding and latched for 1 minute before falling back asleep. I encouraged skin to skin. Mother denies nipple tenderness.  I encouraged her to focus on getting infant's mouth open wide prior to latching and on having chin and lower lip land as far from base of nipple as possible.   Mother reports that infant has had some good nursing sessions where she nursed with a strong suck that causes no discomfort and that mother could identify swallowing (I educated mother on assessing audible swallows and mother verbalized understanding).     Reviewed number of wet and dirty diapers as well as number of feeds to expect each day and mom verbalized understanding.     Mom understands that her white milk should come in between days 3-5 but may come in sooner since she has breastfed before.  Patient understands to expect her breasts to feel heavy and warm.    Reviewed the importance of following up with a Pediatrician 24-48 hours after discharge as recommended by the American Academy of Pediatrics.    I referred pt to the breastfeeding chapter in  A New Beginning PP booklet, to the Interactive Learning Application therein, and to Steps to Successful Breastfeeding video on [employer].    Patient is aware of lactation consult availability and mother has LC mobile phone number for assistance in hospital and community resources for follow up if needed.  Patient was advised to call if assistance is needed or desired.

## 2019-05-08 NOTE — Progress Notes (Addendum)
Date Time: 05/08/2019  8:07 AM    Progress Note   -  Postpartum Day 1    Delivery Type: Repeat Cesarean    Subjective:   No complaints. The patient feels well.  She denies issues with pain.  She is ambulating without dizziness.  She is voiding without difficulty.   .  The RN reports Amount: Small lochia.    Objective:   Temp:  [97.3 F (36.3 C)-98.2 F (36.8 C)] 97.3 F (36.3 C)  Heart Rate:  [70-87] 87  Resp Rate:  [15-20] 18  BP: (91-145)/(63-89) 133/72     Physical Exam:   Gen:  Alert and oriented, no acute distress  Abd: soft, nontender, fundus firm at umbilicus  Ext: no calf tenderness    LABS:     Results     Procedure Component Value Units Date/Time    CBC and differential [[id number]]  (Abnormal) Collected:  05/08/19 0451    Specimen:  Blood Updated:  05/08/19 0524     WBC 12.7 K/cmm      RBC 3.81 M/cmm      Hemoglobin 9.8 gm/dL      Hematocrit 32.4 %      MCV 78 fL      MCH 26 pg      MCHC 33 gm/dL      RDW 40.1 %      PLT CT 270 K/cmm      MPV 7.5 fL      Neutrophils % 72.4 %      Lymphocytes 19.0 %      Monocytes 7.6 %      Eosinophils % 0.9 %      Basophils % 0.1 %      Neutrophils Absolute 9.2 K/cmm      Lymphocytes Absolute 2.4 K/cmm      Monocytes Absolute 1.0 K/cmm      Eosinophils Absolute 0.1 K/cmm      Basophils Absolute 0.0 K/cmm           Assessment:   Patient doing well.    PLAN:   Continue routine postpartum care.      Betsy Pries, MD  05/08/2019

## 2019-05-08 NOTE — Consults (Signed)
See lactation note.

## 2019-05-08 NOTE — Lactation Note (Signed)
Attempted to consult with dyad. MOB getting out of the shower. LC to follow up later today.

## 2019-05-08 NOTE — Plan of Care (Signed)
Problem: Vaginal/Cesarean Delivery  Goal: Postpartum management of pain/discomfort  Description: Interventions:  1. Assess pain using a consistent, developmental/age appropriate pain scale  2. Assess pain level before and following intervention  3. Include patient/patient care companion in decisions related to pain management  4. Monitor for post anesthesia issues related to pain management  5. Offer non-pharmacologic pain management interventions  6. Monitor pain infusion pumps  7. Report ineffective pain management to LIP  8. Consult/Collaborate with Anesthesia or Pain Service  Outcome: Progressing  Flowsheets (Taken 05/08/2019 0014)  Postpartum management of pain/discomfort:   Assess pain using a consistent, developmental/age appropriate pain scale   Assess pain level before and following intervention   Offer non-pharmacologic pain management interventions  Goal: Perineum will be clean, dry, and intact and without discharge or hematoma  Description: Interventions:  1. Place cold pack on perineum  2. Provide pericare  3. Sitz bath PRN as ordered by LIP  4. Monitor wound/incision for signs of infections  5. Assess for hemorrhoids and provide interventions  Outcome: Progressing  Flowsheets (Taken 05/08/2019 0014)  Perineum will be clean, dry, and intact and without discharge or hematoma: Provide pericare  Goal: Incision will be clean, dry, and intact and without discharge or hematoma  Description: Interventions:  1. Assess abdominal dressing and incision  2. Monitor incision for signs of infections  3. Assess incision for bleeding/hematoma  4. Assess incision for evidence of healing  Outcome: Progressing  Flowsheets (Taken 05/08/2019 0014)  Incision will be clean, dry and intact and without discharge or hematoma: Assess abdominal dressing and incision

## 2019-05-09 MED ORDER — OXYCODONE-ACETAMINOPHEN 5-325 MG PO TABS
1.0000 | ORAL_TABLET | ORAL | 0 refills | Status: AC | PRN
Start: 2019-05-09 — End: 2019-05-16

## 2019-05-09 NOTE — Discharge Summary (Signed)
OBSTETRICS - DELIVERY DISCHARGE SUMMARY    05/09/2019 7:20 AM    Admission Date: 05/07/2019  Discharge Date: 05/09/19     Discharge Diagnosis:  Term intrauterine pregnancy now delivered via repeat cesarean section .    Date of Delivery: 05/07/2019    Time of Delivery:  8:20 AM    Delivered By: Truddie Crumble   Delivery Type: Cesarean   EDC: Estimated Date of Delivery: 05/11/19 Gestational Age: [redacted]w[redacted]d  Baby: Liveborn Female ; Apgar 1 minute: 8  Apgar 5 minute: 9 ; Birth Weight: 8 lb 5 oz (3771 g)   Anesthesia:   Delivery Complications: None.  Laceration:    Laceration Type: None  degree. Episiotomy: None     Placenta: , , , , Cord:      Feeding Method: Feeding Type: Formula (05/08/19 2145)     Patient Active Problem List   Diagnosis   . History of cesarean section   . History of postpartum hemorrhage   . Maternal morbid obesity, antepartum   . BMI 50.0-59.9, adult   . Pregnancy with 39 completed weeks gestation   . History of cesarean delivery       There is no immunization history for the selected administration types on file for this patient.    Hospital Course: Kristi Branch is a 28 y.o. female admitted to the [facility] and delivered via Cesarean  delivery. Her postpartum course was uncomplicated. The patient's vital signs remained stable and the patient remained afebrile throughout her hospitalization. She was discharged to home in stable condition.    Postpartum Complications: None.    Discharge Medications:  Current Discharge Medication List      START taking these medications    Details   oxyCODONE-acetaminophen (PERCOCET) 5-325 MG per tablet Take 1 tablet by mouth every 4 (four) hours as needed for Pain  Qty: 10 tablet, Refills: 0         CONTINUE these medications which have NOT CHANGED    Details   Prenatal Vit-Fe Fumarate-FA (PRENATAL VITAMINS PO) Take by mouth         STOP taking these medications       ferrous sulfate 325 (65 FE) MG tablet            Discharge Followup:  Unless  otherwise instructed:  For this cesarean section we recommend that you followup in one (1) week for incision check at the site of your prenatal care and then also in 6 weeks for a postpartum checkup    Discharge Instructions:  Patient to follow discharge instructions as provided at the time of discharge.    Betsy Pries, MD

## 2019-05-09 NOTE — Plan of Care (Signed)
Problem: Vaginal/Cesarean Delivery  Goal: Postpartum management of pain/discomfort  Outcome: Progressing  Goal: Breasts are soft with nipple integrity intact  Outcome: Progressing  Goal: Gastrointestinal/Urinary management  Outcome: Progressing  Goal: Uterine management  Outcome: Progressing  Goal: Perineum will be clean, dry, and intact and without discharge or hematoma  Outcome: Progressing  Goal: Incision will be clean, dry, and intact and without discharge or hematoma  Outcome: Progressing  Goal: VTE Prevention  Outcome: Progressing  Goal: Evidence of positive mother-baby interactions  Outcome: Progressing

## 2019-05-09 NOTE — Progress Notes (Signed)
Date Time: 05/09/2019  7:17 AM    Progress Note   -  Postpartum Day 2    Delivery Type: Cesarean Section     Subjective:   No complaints. The patient feels well.  She denies issues with pain.  She is ambulating without dizziness.  She is voiding without difficulty.   .  The RN reports Amount: Scant lochia.    Objective:   Temp:  [97.9 F (36.6 C)] 97.9 F (36.6 C)  Heart Rate:  [97-105] 105  Resp Rate:  [18] 18  BP: (136-155)/(78-89) 155/89     Physical Exam:   Gen:  Alert and oriented, no acute distress  Abd: soft, nontender, fundus firm at umbilicus  Ext: no calf tenderness    LABS:     Results     ** No results found for the last 24 hours. **          Assessment:   Patient doing well.    PLAN:   Discharge home. and Follow up one week.      Betsy Pries, MD  05/09/2019

## 2019-05-09 NOTE — Lactation Note (Signed)
In to follow up with dyad.      Parents plan for discharge today 05/09/19.  Infant's weight loss is currently at 8% since birth.  Per mother infant is being supplemented with her expressed breastmilk or formula (her choice because she was concerned about weight loss).  Patient states she has been giving a few mL's of formula to get infant interested in feeding and sucking and then puts her to the breast.  Encouraged mom that unless medically indicated formula is not necessary at this time but encouraged offering the breast first.      Reminded mom to feed infant a minimum of eight feeds in 24 hours and how many wet and dirty diapers per day.  She has been provided Similac orthodontic nipples and instructed in proper use to encourage extended jaw and suckling.  Discussed paced bottle feeding and discussed a feeding volume of 15-30 mL if she continues to supplement as a bridge until her milk comes in.     Patient is aware of lactation consult availability and mother has LC mobile phone number for assistance in hospital and community phone numbers for follow up if needed.  Patient was advised to call if assistance is needed or desired but has no needs at this time.       Rennis Chris, RN, BSN, IBCLC

## 2019-06-24 LAB — CYTOLOGY - PAP: Pap: NEGATIVE

## 2019-08-18 ENCOUNTER — Ambulatory Visit (INDEPENDENT_AMBULATORY_CARE_PROVIDER_SITE_OTHER): Payer: Medicaid Other | Admitting: Physician Assistant

## 2019-08-18 ENCOUNTER — Encounter (INDEPENDENT_AMBULATORY_CARE_PROVIDER_SITE_OTHER): Payer: Self-pay

## 2019-08-18 VITALS — BP 115/74 | HR 68 | Temp 97.5°F | Resp 18 | Ht 63.0 in | Wt 271.5 lb

## 2019-08-18 DIAGNOSIS — T148XXA Other injury of unspecified body region, initial encounter: Secondary | ICD-10-CM

## 2019-08-18 DIAGNOSIS — S9032XA Contusion of left foot, initial encounter: Secondary | ICD-10-CM

## 2019-08-18 DIAGNOSIS — W010XXA Fall on same level from slipping, tripping and stumbling without subsequent striking against object, initial encounter: Secondary | ICD-10-CM

## 2019-08-18 NOTE — Patient Instructions (Signed)
Treatment for Bone Bruise (Bone Contusion)  A bone bruise is an injury to a bone that is less severe than a bone fracture. Bone bruises are fairly common. They can happen to people of all ages. Any type of bone in your body can get a bone bruise. Other injuries often happen along with a bone bruise, such as damage to nearby ligaments.  Types of treatment  Treatment for a bone bruise may include:   Resting the bone or joint   Putting an ice pack on the area several times a day   Raising the injury above the level of your heart to reduce swelling   Taking medicine to reduce pain and swelling   Wearing a brace or other device to limit movement  Your healthcare provider may give you advice about your diet. This is because eating a diet that is rich in calcium, vitamin D, and protein can help you heal. Your healthcare provider may ask you not to use certain over-the-counter medicines for pain. Some of these may delay normal bone healing. If you smoke, your healthcare provider will advise you to stop smoking. Smoking can also delay bone healing.  Your healthcare provider will tell you how long you should not put weight on your bone. Most bone bruises slowly heal over 2 to 4 months. A larger bone bruise may take longer to heal. You may not be able to return to sports activities for weeks or months. If your symptoms don't go away, your healthcare provider may order an MRI.  Possible complications of a bone bruise  Most bone bruises heal without any problems. If your bone bruise is very large, your body may have trouble getting blood flow back to the area. This can cause avascular necrosis of the bone. This leads to death of that part of the bone.  When to call the healthcare provider  Call your healthcare provider if your symptoms don't start to get better in a few days. Call him or her right away if you have any severe symptoms, such as a high fever.  [employer] last reviewed this educational content on 02/25/2017    2000-2020 [employer] [address], [place] [address]. All rights reserved. This information is not intended as a substitute for professional medical care. Always follow your healthcare professional's instructions.

## 2019-08-18 NOTE — Progress Notes (Signed)
Subjective:    Patient ID: Kristi Branch is a 28 y.o. female.    HPI  Foot Injury  left foot injury/ friday night/ tripped over a swing frame/ sunday it started to become discolored/ second and third toes are hard to bend  Was improving well  Last night night son tripped over it and it became worse  Does not think it is broken  Would like work note  6 of 10 pain at ecchymosis  Time of day not a factor.   Ambulation without limp     The following portions of the patient's history were reviewed and updated as appropriate: allergies, current medications, past family history, past medical history, past social history, past surgical history and problem list.    Review of Systems   Constitutional: Negative for fatigue and fever.   HENT: Negative for ear pain and sore throat.    Respiratory: Negative for cough and wheezing.    Gastrointestinal: Negative for diarrhea, nausea and vomiting.   Musculoskeletal: Positive for arthralgias, joint swelling and myalgias.   Skin: Negative for pallor and rash.   Neurological: Negative for weakness, numbness and headaches.   All other systems reviewed and are negative.        Objective:    BP 115/74   Pulse 68   Temp 97.5 F (36.4 C) (Oral)   Resp 18   Ht 1.6 m (5\' 3" )   Wt 123.2 kg (271 lb 8 oz)   LMP 07/31/2019 (Approximate)   BMI 48.09 kg/m     Physical Exam  Vitals signs and nursing note reviewed.   Constitutional:       General: She is not in acute distress.     Appearance: She is well-developed. She is not diaphoretic.   HENT:      Head: Normocephalic and atraumatic.   Eyes:      General: No scleral icterus.        Right eye: No discharge.         Left eye: No discharge.      Conjunctiva/sclera: Conjunctivae normal.      Pupils: Pupils are equal, round, and reactive to light.   Neck:      Musculoskeletal: Normal range of motion and neck supple.   Cardiovascular:      Rate and Rhythm: Normal rate.   Pulmonary:      Effort: Pulmonary effort is normal. No  respiratory distress.      Breath sounds: Normal breath sounds.   Abdominal:      Palpations: Abdomen is soft.   Musculoskeletal: Normal range of motion.      Right ankle: Normal.      Left ankle: Normal.      Right foot: Normal.      Left foot: Normal range of motion. Tenderness, bony tenderness and swelling present. No deformity or laceration.        Feet:       Comments: 1+ swelling as drawn. Mild distal metatarsal (2nd) tenderness.   FAROM  Ambulating without limp.   Ecchymosis as drawn in late, dissipating stage.    Skin:     General: Skin is warm and dry.      Capillary Refill: Capillary refill takes less than 2 seconds.      Coloration: Skin is not pale.      Findings: No erythema or rash.   Neurological:      Mental Status: She is alert and oriented to person, place, and time.  Sensory: No sensory deficit.      Motor: No abnormal muscle tone.   Psychiatric:         Behavior: Behavior normal.           Assessment and Plan:       Kristi Branch was seen today for foot injury.    Diagnoses and all orders for this visit:    Contusion of bone    Contusion of left foot, initial encounter    R.I.C.E. Therapy:  Rest is imperative for healing  Ice -- 15 minutes TID or QID  Compression -- such as ACE wrap application  Elevation -- elevating extremity to reduce swelling, preferably above the level of the heart.     Denies radiography   Will follow up if no better or worse     May trial aleve or ibuprofen as on bottle PRN     Advised rest and fluids; discussed appropriate OTC tx for use PRN.   Follow up with PCP/ED or return to clinic if new, worsening, or non-resolving symptoms.  Patient/guardian expressed understanding and agreement with plan of care at time of discharge.   Advised patient to read entirety of AVS.    Patient and provider wearing masks today, only palpation with a gloved hands.      Patient Instructions     Treatment for Bone Bruise (Bone Contusion)  A bone bruise is an injury to a bone that is less  severe than a bone fracture. Bone bruises are fairly common. They can happen to people of all ages. Any type of bone in your body can get a bone bruise. Other injuries often happen along with a bone bruise, such as damage to nearby ligaments.  Types of treatment  Treatment for a bone bruise may include:   Resting the bone or joint   Putting an ice pack on the area several times a day   Raising the injury above the level of your heart to reduce swelling   Taking medicine to reduce pain and swelling   Wearing a brace or other device to limit movement  Your healthcare provider may give you advice about your diet. This is because eating a diet that is rich in calcium, vitamin D, and protein can help you heal. Your healthcare provider may ask you not to use certain over-the-counter medicines for pain. Some of these may delay normal bone healing. If you smoke, your healthcare provider will advise you to stop smoking. Smoking can also delay bone healing.  Your healthcare provider will tell you how long you should not put weight on your bone. Most bone bruises slowly heal over 2 to 4 months. A larger bone bruise may take longer to heal. You may not be able to return to sports activities for weeks or months. If your symptoms don't go away, your healthcare provider may order an MRI.  Possible complications of a bone bruise  Most bone bruises heal without any problems. If your bone bruise is very large, your body may have trouble getting blood flow back to the area. This can cause avascular necrosis of the bone. This leads to death of that part of the bone.  When to call the healthcare provider  Call your healthcare provider if your symptoms don't start to get better in a few days. Call him or her right away if you have any severe symptoms, such as a high fever.  StayWell last reviewed this educational content on 02/25/2017   2000-2020 [employer]. [address], [place], [place] [address]. All rights  reserved. This information is not intended as a substitute for professional medical care. Always follow your healthcare professional's instructions.                      Nuala Alpha, PA  [facility]  08/18/2019  11:29 AM

## 2019-09-08 ENCOUNTER — Ambulatory Visit (INDEPENDENT_AMBULATORY_CARE_PROVIDER_SITE_OTHER)
Admission: RE | Admit: 2019-09-08 | Discharge: 2019-09-08 | Disposition: A | Discharge status: Home / Self Care | Payer: Medicaid Other | Source: Ambulatory Visit | Attending: Physician Assistant | Admitting: Physician Assistant

## 2019-09-08 ENCOUNTER — Other Ambulatory Visit (INDEPENDENT_AMBULATORY_CARE_PROVIDER_SITE_OTHER): Payer: Self-pay | Admitting: Physician Assistant

## 2019-09-08 DIAGNOSIS — Y990 Civilian activity done for income or pay: Secondary | ICD-10-CM

## 2019-12-21 ENCOUNTER — Encounter (INDEPENDENT_AMBULATORY_CARE_PROVIDER_SITE_OTHER): Payer: Self-pay

## 2019-12-21 ENCOUNTER — Telehealth (INDEPENDENT_AMBULATORY_CARE_PROVIDER_SITE_OTHER): Payer: Medicaid Other

## 2019-12-21 VITALS — Ht 63.0 in | Wt 220.0 lb

## 2019-12-21 DIAGNOSIS — J020 Streptococcal pharyngitis: Secondary | ICD-10-CM

## 2019-12-21 LAB — POCT RAPID STREP A: Rapid Strep A Screen POCT: POSITIVE — AB

## 2019-12-21 MED ORDER — AMOXICILLIN 500 MG PO CAPS
500.00 mg | ORAL_CAPSULE | Freq: Two times a day (BID) | ORAL | 0 refills | Status: AC
Start: 2019-12-21 — End: 2019-12-31

## 2019-12-21 MED ORDER — CETIRIZINE HCL 10 MG PO TABS
10.0000 mg | ORAL_TABLET | Freq: Every day | ORAL | 0 refills | Status: DC
Start: 2019-12-21 — End: 2020-03-13

## 2019-12-21 MED ORDER — FLUTICASONE PROPIONATE 50 MCG/ACT NA SUSP
2.00 | Freq: Every day | NASAL | 0 refills | Status: DC
Start: 2019-12-21 — End: 2020-03-13

## 2019-12-21 NOTE — Progress Notes (Signed)
Subjective:    Patient ID: Louvinia Defrancisco is a 29 y.o. female. The patient was evaluated using telehealth platform. Exam findings are visual observation and/or having patient perform various physical exam tasks under my guidance. Verbal consent for evaluation was obtained.       HPI     Patient is a 29 year old female who presents with new onset of congestion and sore throat.  Patient states that she is developed symptoms last 24 hours.  She has some postnasal drip mention runny nose as well as sneezing.  She has some mild sinus pressure.  She denies any headache or myalgias.  No cough shortness of breath or wheezing.  She denies being around anybody with similar illness or COVID-19.  She is concerned of hiring strep throat for Covid and inquiring about being tested.      The following portions of the patient's history were reviewed and updated as appropriate: allergies, current medications, past family history, past medical history, past social history, past surgical history and problem list.    Review of Systems   Constitutional: Negative for chills and fever.   HENT: Positive for congestion, postnasal drip, rhinorrhea, sinus pressure, sneezing and sore throat.    Respiratory: Negative for cough and shortness of breath.    Gastrointestinal: Negative for diarrhea, nausea and vomiting.   Musculoskeletal: Negative for myalgias.   Neurological: Negative for headaches.         Objective:    Ht 1.6 m (5\' 3" )   Wt 99.8 kg (220 lb)   LMP 11/23/2019   BMI 38.97 kg/m     Physical Exam  Vitals signs reviewed.   HENT:      Head: Normocephalic and atraumatic.   Eyes:      Conjunctiva/sclera: Conjunctivae normal.   Pulmonary:      Effort: Pulmonary effort is normal.   Neurological:      Mental Status: She is alert and oriented to person, place, and time.           Assessment and Plan:       Kimyah was seen today for sore throat.    Diagnoses and all orders for this visit:    Acute streptococcal pharyngitis  -      Cancel: Rapid Strep A POCT  -     SARS-CoV-2 Assay ([employer] System(TM)); Future  -     POCT Rapid Group A Strep    Other orders  -     fluticasone (Flonase) 50 MCG/ACT nasal spray; 2 sprays by Nasal route daily  -     cetirizine (ZyrTEC Allergy) 10 MG tablet; Take 1 tablet (10 mg total) by mouth daily  -     amoxicillin (AMOXIL) 500 MG capsule; Take 1 capsule (500 mg total) by mouth 2 (two) times daily for 10 days    Patient is a 29 year old female who presents with sore throat, ear positive for strep throat.  Recommend taking antibiotic for 10 days with amoxicillin.  She should also change her toothbrush on the third day of antibiotic to prevent reinfection.  She may take ibuprofen as needed if she develops much congestion she can also take some Flonase or/and Zyrtec to help.        Kyla Balzarine, MD  [facility]  12/21/2019  7:17 PM

## 2020-03-13 ENCOUNTER — Ambulatory Visit (INDEPENDENT_AMBULATORY_CARE_PROVIDER_SITE_OTHER): Payer: Medicaid Other | Admitting: Family Medicine

## 2020-03-13 ENCOUNTER — Encounter (INDEPENDENT_AMBULATORY_CARE_PROVIDER_SITE_OTHER): Payer: Self-pay | Admitting: Family Medicine

## 2020-03-13 VITALS — BP 124/80 | HR 91 | Temp 97.5°F | Resp 15 | Ht 63.0 in | Wt 276.0 lb

## 2020-03-13 DIAGNOSIS — H6692 Otitis media, unspecified, left ear: Secondary | ICD-10-CM

## 2020-03-13 MED ORDER — AMOXICILLIN-POT CLAVULANATE 875-125 MG PO TABS
1.00 | ORAL_TABLET | Freq: Two times a day (BID) | ORAL | 0 refills | Status: AC
Start: 2020-03-13 — End: 2020-03-23

## 2020-03-13 NOTE — Patient Instructions (Signed)
Middle Ear Infection (Adult)  You have an infection of the middle ear, the space behind the eardrum. This is also called acute otitis media (AOM). Sometimes it's caused by the common cold. This is because congestion can block the internal passage (eustachian tube) that drains fluid from the middle ear. When the middle ear fills with fluid, bacteria can grow there and cause an infection. Oral antibiotics are used to treat this illness, not ear drops. Symptoms usually start to improve within 1 to 2 days of treatment.    Home care  The following are general care guidelines:   Finish all of the antibiotic medicine given, even though you may feel better after the first few days.   You may use over-the-counter medicine, such as acetaminophen or ibuprofen, to control pain and fever, unless something else was prescribed. Talk with your healthcare provider before using these medicines if you have chronic liver or kidney disease. Also talk with your provider if you have had a stomach ulcer or digestive bleeding. Don't give aspirin to anyone under 18 years of age who has a fever. It may cause severe illness or death.  Follow-up care  Follow up with your healthcare provider in 2 weeks, or as advised, if all symptoms have not gotten better, or if hearing doesn't go back to normal within 1 month.  When to seek medical advice  Call your healthcare provider right away if any of these occur:   Ear pain gets worse or does not improve after 3 days of treatment   Unusual drowsiness or confusion   Neck pain, stiff neck, or headache   Fluid or blood draining from the ear canal   Fever of 100.4F (38C) or as advised   Seizure  StayWell last reviewed this educational content on 06/28/2018   2000-2020 [employer]. [address], [place] [address]. All rights reserved. This information is not intended as a substitute for professional medical care. Always follow your healthcare professional's  instructions.

## 2020-03-13 NOTE — Progress Notes (Signed)
Subjective:    Patient ID: Kristi Branch is a 29 y.o. female.    HPI  Left ear pain started about 1 week ago. Pain is worsening. She has tried tylenol with no relief. She has been using hydrogen peroxide to clean.   Denies fever, congestion, sore throat, headache, dizziness.  Denies drainage  History of recurrent ear infections    The following portions of the patient's history were reviewed and updated as appropriate: allergies, current medications, past family history, past medical history, past social history, past surgical history and problem list.    Review of Systems   Constitutional: Negative for fever.   HENT: Positive for ear pain. Negative for congestion, hearing loss and rhinorrhea.    Eyes: Negative for discharge and redness.   Respiratory: Negative for cough, shortness of breath and wheezing.    Cardiovascular: Negative for chest pain.   Gastrointestinal: Negative for abdominal pain, nausea and vomiting.   Genitourinary: Negative for dysuria.   Musculoskeletal: Negative for arthralgias.   Skin: Negative for rash.   Neurological: Negative for dizziness, light-headedness and headaches.   Hematological: Does not bruise/bleed easily.   Psychiatric/Behavioral: Negative for behavioral problems and confusion.         Objective:    BP 124/80    Pulse 91    Temp 97.5 F (36.4 C)    Resp 15    Ht 1.6 m (5\' 3" )    Wt 125.2 kg (276 lb)    LMP 03/06/2020 (Exact Date)    Breastfeeding No    BMI 48.89 kg/m     Physical Exam  Constitutional:       Appearance: She is well-developed.   HENT:      Head: Normocephalic.      Right Ear: Tympanic membrane and external ear normal.      Left Ear: External ear normal.      Ears:      Comments: TM red bulging  Eyes:      Conjunctiva/sclera: Conjunctivae normal.   Cardiovascular:      Rate and Rhythm: Normal rate and regular rhythm.      Heart sounds: Normal heart sounds.   Pulmonary:      Effort: Pulmonary effort is normal. No respiratory distress.      Breath sounds:  Normal breath sounds. No wheezing.   Musculoskeletal:      Cervical back: Normal range of motion.   Lymphadenopathy:      Cervical: No cervical adenopathy.   Skin:     General: Skin is warm and dry.   Neurological:      Mental Status: She is alert and oriented to person, place, and time.   Psychiatric:         Mood and Affect: Mood normal.         Thought Content: Thought content normal.           Assessment and Plan:       [name] was seen today for ear problem.    Diagnoses and all orders for this visit:    Left otitis media, unspecified otitis media type    Other orders  -     amoxicillin-clavulanate (Augmentin) 875-125 MG per tablet; Take 1 tablet by mouth 2 (two) times daily for 10 days    Take abx twice daily for 10 days  Take with full glass of water and food    Continue supportive care like Rest, plenty of fluid, prn tylenol/NSAIDS.   Need to  call if no better or worst.  The Patient understands and agree with the plan.           Jodelle Red , MD  [facility]  03/13/2020  12:05 PM

## 2020-03-15 ENCOUNTER — Ambulatory Visit (INDEPENDENT_AMBULATORY_CARE_PROVIDER_SITE_OTHER): Payer: Medicaid Other | Admitting: Family

## 2020-03-15 ENCOUNTER — Encounter (INDEPENDENT_AMBULATORY_CARE_PROVIDER_SITE_OTHER): Payer: Self-pay

## 2020-03-15 VITALS — BP 135/91 | HR 70 | Temp 98.2°F | Resp 18 | Ht 63.0 in | Wt 275.0 lb

## 2020-03-15 DIAGNOSIS — H60312 Diffuse otitis externa, left ear: Secondary | ICD-10-CM

## 2020-03-15 MED ORDER — CIPROFLOXACIN-DEXAMETHASONE 0.3-0.1 % OT SUSP
4.00 [drp] | Freq: Two times a day (BID) | OTIC | 0 refills | Status: DC
Start: 2020-03-15 — End: 2020-05-23

## 2020-03-15 NOTE — Patient Instructions (Addendum)
Will have pt continue to take the Augmentin and will add ear drops.  Use twice a day.  OTC meds reviewed. Did talk about using ear plug left ear while showering. S&S reviewed when to RTC vs ER.   External Ear Infection (Adult)    External otitis (also called "swimmer's ear") is an infection in the ear canal. It's often caused by bacteria or fungus. It can occur a few days after water gets trapped in the ear canal (from swimming or bathing). It can also occur after cleaning too deeply in the ear canal with a cotton swab or other object. Sometimes, hair care products get into the ear canal and cause this problem.   Symptoms can include pain, fever, itching, redness, drainage, or swelling of the ear canal. Temporary hearing loss may also occur.   Home care   Don't try to clean the ear canal. This can push pus and bacteria deeper into the canal.   Use prescribed ear drops as directed. These help reduce swelling and fight the infection. If an ear wick was placed in the ear canal, apply drops right onto the end of the wick. The wick will draw the medicine into the ear canal even if it's swollen closed.   A cotton ball may be loosely placed in the outer ear to absorb any drainage.   You may use over-the-counter medicines to control pain as directed by the healthcare provider, unless another medicinewas prescribed. Talk with your provider before using these medicines if you have chronic liver or kidney disease or ever had a stomach ulcer or digestive tract bleeding.   Don't allow water to get into your ear when bathing. Also don't swim until the infection has cleared.    Prevention   Keep your ears dry. This helps lower the risk of infection. Dry your ears with a towel or hair dryer after getting wet. Also, use ear plugs when swimming.   Don't stick any objects in the ear to remove wax.   Talk with your provider about using ear drops to prevent swimmer's ear in case you feel water trapped in your ear canal. You can  get these drops over the counter at most drugstores. They work by removing water from the ear canal.    Follow-up care  Follow up with your healthcare provider in 1 week, or as advised.   When to seek medical advice  Call your healthcare provider right away if any of these occur:    Ear pain becomes worse or doesn't improve after 3 days of treatment   Redness or swelling of the outer ear occurs or gets worse   Headache   Fever of 100.62F (38C) or higher, or as directed by your healthcare provider  Call 911  Call 911 or get immediate medical care if any of the following occur:    Seizure   Unusual drowsiness or confusion   Unusual painful or stiff neck    StayWell last reviewed this educational content on 05/29/2019   2000-2020 [employer] [place]. [address], [place], [place] [address]. All rights reserved. This information is not intended as a substitute for professional medical care. Always follow your healthcare professional's instructions.

## 2020-03-15 NOTE — Progress Notes (Signed)
Subjective:    Patient ID: Kristi Branch is a 29 y.o. female.    HPI-left ear, pain/swelling, antibiotic given not working.  Was seen two days ago and started on Augmentin.      Reports is worse since two days ago.  Ear canal is 'more shut" than before. Is taking augmentin twice a day.   Hx of recurrent OE/OM. Denies recent swimming.   Denies fever,chills.  Changes in smell or taste.     The following portions of the patient's history were reviewed and updated as appropriate: allergies, current medications, past family history, past medical history, past social history, past surgical history and problem list.     This provider utilized all appropriate PPE equipment during this visit "loop mask" "Face shield/eye glasses" Gown/gloves" "patient wearing face mask/covering" "Family is wearing mask if present in room with pt",  All provider tools were cleaned with disinfectant solution before and after use on this patient.          Review of Systems   Constitutional: Negative for fatigue and fever.   HENT: Positive for ear discharge and ear pain. Negative for sinus pressure, sinus pain and sore throat.    Respiratory: Negative for apnea, cough and shortness of breath.    Cardiovascular: Negative for chest pain and palpitations.   Gastrointestinal: Negative for abdominal distention, constipation, diarrhea, nausea and vomiting.   Genitourinary: Negative for difficulty urinating.   Musculoskeletal: Negative for back pain.   Neurological: Negative for dizziness and headaches.   Hematological: Does not bruise/bleed easily.         Objective:    BP (!) 135/91    Pulse 70    Temp 98.2 F (36.8 C) (Oral)    Resp 18    Ht 1.6 m (5\' 3" )    Wt 124.7 kg (275 lb)    LMP 03/06/2020 (Exact Date)    Breastfeeding No    BMI 48.71 kg/m     Physical Exam  Vitals and nursing note reviewed.   Constitutional:       Appearance: Normal appearance. She is obese. She is not ill-appearing or toxic-appearing.   HENT:      Right Ear:  Tympanic membrane, ear canal and external ear normal.      Left Ear: External ear normal.      Ears:      Comments: Left canal with mod edema in canal and TM is bugling with mod erythema.   Cardiovascular:      Rate and Rhythm: Normal rate and regular rhythm.      Heart sounds: Normal heart sounds.   Pulmonary:      Effort: Pulmonary effort is normal.      Breath sounds: Normal breath sounds.   Abdominal:      General: Abdomen is flat. Bowel sounds are normal.      Palpations: Abdomen is soft.      Tenderness: There is no abdominal tenderness.      Comments: Obese    Skin:     General: Skin is warm and dry.      Capillary Refill: Capillary refill takes less than 2 seconds.   Neurological:      Mental Status: She is alert.   Psychiatric:         Mood and Affect: Mood normal.         Behavior: Behavior normal.         Thought Content: Thought content normal.  Judgment: Judgment normal.           Assessment and Plan:       Norita was seen today for ear problem.    Diagnoses and all orders for this visit:    Acute diffuse otitis externa of left ear    Other orders  -     ciprofloxacin-dexAMETHasone (Ciprodex) otic suspension; Place 4 drops into the left ear 2 (two) times daily    Will have pt continue to take the Augmentin and will add ear drops.  Use twice a day.  OTC meds reviewed. Did talk about using ear plug left ear while showering. S&S reviewed when to RTC vs ER.   BP elevated today because of pain.     When to seek medical advice  Call your healthcare provider right away if any of these occur:    Ear pain becomes worse or doesnt improve after 3 days of treatment   Redness or swelling of the outer ear occurs or gets worse   Headache   Fever of 100.43F (38C) or higher, or as directed by your healthcare provider    The patient was counseled on possible medication/treatment side effects. The patient was encouraged to take any and all medications as prescribed, read all pharmacy handouts, and patient  instructions.     Vital signs noted no acute management indicated at this time. The patient was instructed to follow up with their primary care provider. The patient was also instructed to seek medical advice if they experience worsening symptoms and to follow up in 2-3 days if no improvement in symptoms.     The patient was instructed to keep all current healthcare appointments. At the conclusion of the visit we reviewed diagnosis, treatment plan, diagnostic tests, prescriptions and follow up instructions pertaining to this visit. All patient questions and concerns regarding the current condition were addressed.        Esmond Harps, FNP  [facility]  03/15/2020  10:32 AM

## 2020-05-23 ENCOUNTER — Encounter (INDEPENDENT_AMBULATORY_CARE_PROVIDER_SITE_OTHER): Payer: Self-pay | Admitting: Physician Assistant

## 2020-05-23 ENCOUNTER — Ambulatory Visit (INDEPENDENT_AMBULATORY_CARE_PROVIDER_SITE_OTHER)
Admission: RE | Admit: 2020-05-23 | Discharge: 2020-05-23 | Disposition: A | Discharge status: Home / Self Care | Payer: Medicaid Other | Source: Ambulatory Visit | Attending: Physician Assistant | Admitting: Physician Assistant

## 2020-05-23 ENCOUNTER — Ambulatory Visit (INDEPENDENT_AMBULATORY_CARE_PROVIDER_SITE_OTHER): Payer: Medicaid Other | Admitting: Physician Assistant

## 2020-05-23 VITALS — BP 149/84 | HR 82 | Temp 98.7°F | Resp 17 | Ht 63.0 in | Wt 277.7 lb

## 2020-05-23 DIAGNOSIS — M25562 Pain in left knee: Secondary | ICD-10-CM

## 2020-05-23 NOTE — Patient Instructions (Signed)
RICE    RICE stands for rest, ice, compression, and elevation. Doing these things helps limit pain and swelling after an injury. RICE also helps injuries heal faster. Use RICE for sprains, strains, and severe bruises or bumps. Follow the tips on this handout and begin RICE as soon as possible after an injury.   Rest  Pain is your body's way of telling you to rest an injured area. Whether you have hurt an elbow, hand, foot, or knee, limiting its use will prevent further injury and help you heal.   Ice  Applying ice right after an injury helps prevent swelling and reduce pain. Don't place ice directly on your skin.    Wrap a cold pack or bag of ice in a thin cloth. Place it over the injured area.   Ice for 10minutes every 3hours. Don't ice for more than 20minutes at a time.  Compression  Putting pressure (compression) on an injury helps prevent swelling and provides support.   Wrap the injured area firmly with an elastic bandage. If your hand or foot tingles, becomes discolored, or feels cold to the touch, the bandage may be too tight. Rewrap it more loosely.   If your bandage becomes too loose, rewrap it.   Do not wear an elastic bandage overnight.  Elevation  Keeping an injury elevated helps reduce swelling, pain, and throbbing. Elevation is most effectivewhen the injury is kept elevated higher than the heart.   Call your healthcare provider if you notice any of the following:   Fingers or toes feel numb, are cold to the touch, or change color.   Skin looks shiny or tight.   Pain, swelling, or bruising worsens and is not improved with elevation.  StayWell last reviewed this educational content on 02/25/2017   2000-2021 [employer]. All rights reserved. This information is not intended as a substitute for professional medical care. Always follow your healthcare professional's instructions.

## 2020-05-23 NOTE — Progress Notes (Signed)
Subjective:    Patient ID:   Kristi Branch is a 29 y.o. female who complains of left knee pain since yesterday. 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 She describes her knee feeling "tight" and that it was "going out." When asked to clarify, she made the motion of her knee going laterally. Denies popping, locking or buckling. No numbness or tingling. Has not tried ice/NSAIDs. No specific twisting or traumatic mechanism.     The following portions of the patient's history were reviewed and updated as appropriate: allergies, current medications, past medical history, past surgical history and problem list.    Review of Systems   Constitutional: Negative for chills and fever.   HENT: Negative for congestion and sore throat.    Respiratory: Negative for cough and shortness of breath.    Cardiovascular: Negative for chest pain and leg swelling.   Gastrointestinal: Negative for abdominal pain, diarrhea, nausea and vomiting.   Musculoskeletal: Positive for joint pain. Negative for myalgias.   Neurological: Negative for tingling, sensory change and focal weakness.            Objective:     Vitals:    05/23/20 1531   BP: 149/84   Pulse: 82   Resp: 17   Temp: 98.7 F (37.1 C)       Physical Exam  General: no acute distress, obese  Cardiac: regular rate and rhythm  Lungs: Clear to auscultation, no wheezes, rales or rhonchi   MSK: no obvious deformity but prominent valgus deformity at baseline. Habitus precludes detection of any swelling. Able to flex and extend. No pain on palpation of patella or joint line. No MCL/LCL laxity, negative anterior drawer test.   Psych: appropriate mood and affect       Results     ** No results found for the last 24 hours. **          XR Knee Left 4+ Views    Result Date: 05/23/2020  Negative ReadingStation:[id number]      Assessment and Plan:   Kristi Branch was seen today  for knee pain.    Diagnoses and all orders for this visit:    Acute pain of left knee  -     XR Knee Left 4+ Views; Future    Suspected muscle strain, likely involving poor mechanics from lifting loads yesterday. Do not see any major ligament damage on physical exam. ACE wrapped by nursing. Discussed diligent RICE and NSAIDs prn. If no improvement in couple weeks, would next consider MRI/ortho referral.         Jonn Shingles, PA  [facility]  05/23/2020 4:16 PM

## 2020-06-05 ENCOUNTER — Emergency Department
Admission: EM | Admit: 2020-06-05 | Discharge: 2020-06-05 | Disposition: A | Discharge status: Home / Self Care | Payer: Medicaid Other | Attending: Emergency Medicine | Admitting: Emergency Medicine

## 2020-06-05 DIAGNOSIS — Z3A01 Less than 8 weeks gestation of pregnancy: Secondary | ICD-10-CM | POA: Insufficient documentation

## 2020-06-05 DIAGNOSIS — O26891 Other specified pregnancy related conditions, first trimester: Secondary | ICD-10-CM | POA: Insufficient documentation

## 2020-06-05 DIAGNOSIS — R110 Nausea: Secondary | ICD-10-CM | POA: Insufficient documentation

## 2020-06-05 DIAGNOSIS — Z3490 Encounter for supervision of normal pregnancy, unspecified, unspecified trimester: Secondary | ICD-10-CM

## 2020-06-05 DIAGNOSIS — O99411 Diseases of the circulatory system complicating pregnancy, first trimester: Secondary | ICD-10-CM | POA: Insufficient documentation

## 2020-06-05 DIAGNOSIS — I10 Essential (primary) hypertension: Secondary | ICD-10-CM | POA: Insufficient documentation

## 2020-06-05 LAB — VH URINALYSIS WITH MICROSCOPIC
Bilirubin, UA: NEGATIVE
Blood, UA: NEGATIVE
Glucose, UA: NEGATIVE mg/dL
Ketones UA: NEGATIVE mg/dL
Leukocyte Esterase, UA: NEGATIVE Leu/uL
Nitrite, UA: NEGATIVE
Protein, UR: NEGATIVE mg/dL
RBC, UA: 1 /hpf (ref 0–5)
Squam Epithel, UA: 4 /hpf — ABNORMAL HIGH (ref 0–2)
Urine Specific Gravity: 1.023 (ref 1.001–1.040)
Urobilinogen, UA: NORMAL mg/dL
WBC, UA: 1 /hpf (ref 0–4)
pH, Urine: 5 pH (ref 5.0–8.0)

## 2020-06-05 LAB — BASIC METABOLIC PANEL
Anion Gap: 10.6 mMol/L (ref 7.0–18.0)
BUN / Creatinine Ratio: 9.5 Ratio — ABNORMAL LOW (ref 10.0–30.0)
BUN: 7 mg/dL (ref 7–22)
CO2: 24 mMol/L (ref 20–30)
Calcium: 9.1 mg/dL (ref 8.5–10.5)
Chloride: 106 mMol/L (ref 98–110)
Creatinine: 0.74 mg/dL (ref 0.60–1.20)
EGFR: 108 mL/min/{1.73_m2} (ref 60–150)
Glucose: 77 mg/dL (ref 71–99)
Osmolality Calculated: 271 mOsm/kg — ABNORMAL LOW (ref 275–300)
Potassium: 3.6 mMol/L (ref 3.5–5.3)
Sodium: 137 mMol/L (ref 136–147)

## 2020-06-05 LAB — VH I-STAT BHCG
BHCG Qualitative, I-Stat: POSITIVE — AB
BHCG Quantitative, I-Stat: >2000 IU/L

## 2020-06-05 LAB — CBC AND DIFFERENTIAL
Basophils %: 0.3 % (ref 0.0–3.0)
Basophils Absolute: 0 10*3/uL (ref 0.0–0.3)
Eosinophils %: 3 % (ref 0.0–7.0)
Eosinophils Absolute: 0.3 10*3/uL (ref 0.0–0.8)
Hematocrit: 38.6 % (ref 36.0–48.0)
Hemoglobin: 12 gm/dL (ref 12.0–16.0)
Lymphocytes Absolute: 2.2 10*3/uL (ref 0.6–5.1)
Lymphocytes: 20.5 % (ref 15.0–46.0)
MCH: 23 pg — ABNORMAL LOW (ref 28–35)
MCHC: 31 gm/dL — ABNORMAL LOW (ref 32–36)
MCV: 74 fL — ABNORMAL LOW (ref 80–100)
MPV: 7.5 fL (ref 6.0–10.0)
Monocytes Absolute: 0.8 10*3/uL (ref 0.1–1.7)
Monocytes: 7.6 % (ref 3.0–15.0)
Neutrophils %: 68.5 % (ref 42.0–78.0)
Neutrophils Absolute: 7.4 10*3/uL (ref 1.7–8.6)
PLT CT: 437 10*3/uL (ref 130–440)
RBC: 5.24 10*6/uL — ABNORMAL HIGH (ref 3.80–5.00)
RDW: 15.6 % — ABNORMAL HIGH (ref 11.0–14.0)
WBC: 10.8 10*3/uL (ref 4.0–11.0)

## 2020-06-05 LAB — VH I-STAT BHCG NOTIFICATION

## 2020-06-05 LAB — HCG, SERUM, QUALITATIVE: BHCG Qualitative: POSITIVE

## 2020-06-05 NOTE — ED Provider Notes (Signed)
Clinical Impression  1. Nausea    2. Essential hypertension    3. Pregnancy, unspecified gestational age          Pt is a 29 yo who comes with nausea and a missed cycle to find out if she is pregnant. She has no abd pain/spotting/bleeding and believes she's approx 4 wk preg. HCG confirms same. Advised PNV and establish OB care in f/u. Stable for discharge   Disposition:  ED Disposition     ED Disposition Condition Date/Time Comment    Discharge  Mon Jun 05, 2020  6:12 PM [name] discharge to home/self care.    Condition at disposition: Stable            Dimple Casey  06/07/2020 12:45 PM        Chief Complaint   Patient presents with    Nausea        HPI  Pt is a 29 yo that notes nausea, tired, and haven't had my period this month so came to find out if she is pregnant. No bleeding, spotting, discharge. No other c/o ha.   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 Treated in ED.    Late prenatal care     32 weeks    Obesity      Past Surgical History:   Procedure Laterality Date    CESAREAN SECTION N/A 08/07/2015    Procedure: CESAREAN SECTION;  Surgeon: Levander Campion, MD;  Location: [facility];  Service: Obstetrics;  Laterality: N/A;    CESAREAN SECTION, REPEAT Bilateral 05/07/2019    Procedure: CESAREAN SECTION, REPEAT;  Surgeon: Truddie Crumble, MD;  Location: [facility];  Service: Obstetrics;  Laterality: Bilateral;     Family History   Problem Relation Age of Onset    COPD Mother     Asthma Mother     Hypertension Paternal Grandmother     No known problems Father        Social History     Tobacco Use    Smoking status: Never Smoker    Smokeless tobacco: Never Used   Vaping Use    Vaping Use: Never used   Substance Use Topics    Alcohol use: No    Drug use: No       Review of Systems   Constitutional: Negative.    Respiratory: Negative.    Cardiovascular: Negative.     Gastrointestinal: Positive for nausea. Negative for diarrhea and vomiting.   Genitourinary: Negative.    Musculoskeletal: Negative.        ED Triage Vitals [06/05/20 1448]   Enc Vitals Group      BP (!) 153/108      Heart Rate 80      Resp Rate 16      Temp 98.6 F (37 C)      Temp Source Tympanic      SpO2 97 %      Weight 127 kg      Height 1.6 m      Head Circumference       Peak Flow       Pain Score 0      Pain Loc       Pain Edu?       Excl. in GC?        Physical Exam  Vitals and nursing note reviewed.   Constitutional:       General: She is not in acute distress.     Appearance: Normal appearance. She is obese. She is not ill-appearing or toxic-appearing.   HENT:      Mouth/Throat:      Mouth: Mucous membranes are moist.      Pharynx: Oropharynx is clear.   Eyes:      Conjunctiva/sclera: Conjunctivae normal.   Cardiovascular:      Rate and Rhythm: Normal rate and regular rhythm.      Heart sounds: No murmur heard.     Pulmonary:      Effort: Pulmonary effort is normal.   Skin:     General: Skin is warm and dry.      Capillary Refill: Capillary refill takes less than 2 seconds.      Coloration: Skin is not jaundiced.      Findings: No rash.   Neurological:      General: No focal deficit present.      Mental Status: She is alert and oriented to person, place, and time.         ED Course as of Jun 07 1244   Mon Jun 05, 2020   1632 Cbc unremarkable, ua neg    [GM]   1810 BHCG Qualitative: Positive [GM]   1821 Last time preg pt was 24 wks before u/s. No bleeding/spotting this time but believes only 4 wk (separated for 1 1/2 yrs and only came together first of July)    [GM]      ED Course User Index  [GM] Dimple Casey, MD       Reviewed patient's previous medical records including but not limited to [facility] previous [facility] visits    Reviewed nursing notes, updated vitals/assessments.     POCUS n/a       Procedures    Review previous pertinent visits       Dimple Casey, MD  06/07/20 1246

## 2020-06-05 NOTE — Discharge Instructions (Addendum)
May try some ginseng to control your nausea or sniff a rubbing alcohol pad; also start some flintstones vitamins

## 2020-07-05 LAB — RPR: RPR: NONREACTIVE

## 2020-07-05 LAB — VH STD AMPLIFIED DNA PROBE
Chlamydia trachomatis: NEGATIVE
Neisseria gonorrhoeae: NEGATIVE

## 2020-07-05 LAB — HIV AG/AB 4TH GENERATION: HIV Ag/Ab, 4th Generation: NEGATIVE

## 2020-07-05 LAB — VH RUBELLA SCREEN, SERUM: Rubella IgG: IMMUNE

## 2020-07-05 LAB — HEPATITIS B SURFACE ANTIGEN W/ REFLEX TO CONFIRMATION: Hepatitis B Surface Antigen: NEGATIVE

## 2020-07-29 ENCOUNTER — Encounter (INDEPENDENT_AMBULATORY_CARE_PROVIDER_SITE_OTHER): Payer: Self-pay

## 2020-07-29 ENCOUNTER — Ambulatory Visit (INDEPENDENT_AMBULATORY_CARE_PROVIDER_SITE_OTHER): Payer: Medicaid Other | Admitting: Family

## 2020-07-29 VITALS — Ht 63.0 in | Wt 270.0 lb

## 2020-07-29 DIAGNOSIS — J029 Acute pharyngitis, unspecified: Secondary | ICD-10-CM

## 2020-07-29 DIAGNOSIS — B349 Viral infection, unspecified: Secondary | ICD-10-CM

## 2020-07-29 LAB — VH RAPID STREP UCC POCT: Rapid Strep A Screen POCT: NEGATIVE

## 2020-07-29 NOTE — Patient Instructions (Signed)
Patient's rapid strept test was negative today. A throat culture not ordered. Discussed negative result with parent/patient. I recommended supportive care as follows:  push fluids, rest, throat lozenges ,popsicles, warm fluids, gargle warm salt water, drink warm tea with honey, and vaporizer or mist prn. Use acetaminophen and/or ibuprofen prn for fever or pain.  4:05 PM I called patient and reviewed negative testing and above information.  Patient voiced understanding and thanked me.

## 2020-07-29 NOTE — Progress Notes (Signed)
Subjective:    Patient ID: Kristi Branch is a 29 y.o. female.    HPI-Patient reports nasal congestion and throat pain for the past 2 days. Patient is fully vaccinated    29 year old female seen in the back parking lot by me.  Patient is [redacted] weeks pregnant.  Patient reports that son was sick this week and tested at his pediatrician and was negative for Covid, flu, other infections.  Patient reports nasal congestion and a sore throat for the past 2 days.  Denies fever, chills, nausea, vomiting, diarrhea.  Patient has not taken any over-the-counter cold or cough medicines because she is not sure what she can take being pregnant.    The following portions of the patient's history were reviewed and updated as appropriate: allergies, current medications, past family history, past medical history, past social history, past surgical history and problem list.    Review of Systems   Constitutional: Negative for fatigue and fever.   HENT: Positive for congestion, postnasal drip, rhinorrhea, sinus pressure, sinus pain and sore throat.    Respiratory: Negative for cough, shortness of breath and wheezing.    Cardiovascular: Negative for chest pain and palpitations.   Gastrointestinal: Negative for abdominal pain, diarrhea, nausea and vomiting.   Genitourinary: Negative for dysuria.   Musculoskeletal: Negative for arthralgias and myalgias.   Neurological: Negative for dizziness, light-headedness and headaches.   Hematological: Does not bruise/bleed easily.         Objective:    Ht 1.6 m (5\' 3" ) Comment: patient reported   Wt 122.5 kg (270 lb) Comment: patient reported   LMP 04/05/2020    BMI 47.83 kg/m     Physical Exam  Vitals and nursing note reviewed.   Constitutional:       Appearance: She is obese. She is not ill-appearing or toxic-appearing.   HENT:      Right Ear: Tympanic membrane, ear canal and external ear normal.      Left Ear: Tympanic membrane, ear canal and external ear normal.      Nose: Congestion and  rhinorrhea present.      Mouth/Throat:      Mouth: Mucous membranes are moist.      Pharynx: Oropharynx is clear. Posterior oropharyngeal erythema present.   Cardiovascular:      Rate and Rhythm: Normal rate and regular rhythm.      Heart sounds: Normal heart sounds.   Pulmonary:      Effort: Pulmonary effort is normal. No respiratory distress.      Breath sounds: Normal breath sounds.   Musculoskeletal:      Cervical back: Normal range of motion and neck supple. No tenderness.   Lymphadenopathy:      Cervical: No cervical adenopathy.   Skin:     General: Skin is warm and dry.      Capillary Refill: Capillary refill takes less than 2 seconds.   Neurological:      General: No focal deficit present.      Mental Status: She is alert.   Psychiatric:         Mood and Affect: Mood normal.         Behavior: Behavior normal.         Thought Content: Thought content normal.         Judgment: Judgment normal.           Results     Procedure Component Value Units Date/Time    Rapid Strep A POCT [[id number]]  (  Normal) Collected: 07/29/20 1533     Updated: 07/29/20 1541     POCT QC Pass     Rapid Strep A Screen POCT Negative         Assessment and Plan:       Kristi Branch was seen today for uri.    Diagnoses and all orders for this visit:    Pharyngitis, unspecified etiology  -     Rapid Strep A POCT      Patient's rapid strept test was negative today. A throat culture not ordered. Discussed negative result with parent/patient. I recommended supportive care as follows:  push fluids, rest, throat lozenges ,popsicles, warm fluids, gargle warm salt water, drink warm tea with honey, and vaporizer or mist prn. Use acetaminophen and/or ibuprofen prn for fever or pain.  4:05 PM I called patient and reviewed negative testing and above information.  Patient voiced understanding and thanked me.    The patient was counseled on possible medication/treatment side effects. The patient was encouraged to take any and all medications as prescribed,  read all pharmacy handouts, and patient instructions.     Vital signs noted no acute management indicated at this time. The patient was instructed to follow up with their primary care provider. The patient was also instructed to seek medical advice if they experience worsening symptoms and to follow up in 2-3 days if no improvement in symptoms.     The patient was instructed to keep all current healthcare appointments. At the conclusion of the visit we reviewed diagnosis, treatment plan, diagnostic tests, prescriptions and follow up instructions pertaining to this visit. All patient questions and concerns regarding the current condition were addressed.             Esmond Harps, FNP  [facility]  07/29/2020  3:42 PM

## 2020-08-02 ENCOUNTER — Ambulatory Visit (INDEPENDENT_AMBULATORY_CARE_PROVIDER_SITE_OTHER): Payer: Medicaid Other | Admitting: Family

## 2020-08-02 ENCOUNTER — Encounter (INDEPENDENT_AMBULATORY_CARE_PROVIDER_SITE_OTHER): Payer: Self-pay

## 2020-08-02 VITALS — BP 148/98 | HR 74 | Temp 97.9°F | Resp 18 | Ht 63.0 in | Wt 270.0 lb

## 2020-08-02 DIAGNOSIS — Z3A15 15 weeks gestation of pregnancy: Secondary | ICD-10-CM

## 2020-08-02 DIAGNOSIS — R030 Elevated blood-pressure reading, without diagnosis of hypertension: Secondary | ICD-10-CM

## 2020-08-02 DIAGNOSIS — Z331 Pregnant state, incidental: Secondary | ICD-10-CM

## 2020-08-02 DIAGNOSIS — J020 Streptococcal pharyngitis: Secondary | ICD-10-CM

## 2020-08-02 LAB — VH POCT UA-AUTOMATED(UCC)
Bilirubin, UA POCT: NEGATIVE
Blood, UA POCT: NEGATIVE
Glucose, UA POCT: NEGATIVE
Ketones, UA POCT: NEGATIVE mg/dL
Nitrite, UA POCT: NEGATIVE
PH, UA POCT: 6 (ref 4.6–8)
Protein, UA POCT: NEGATIVE mg/dL
Specific Gravity, UA POCT: >=1.03 mg/dL (ref 1.001–1.035)
Urine Leukocytes POCT: NEGATIVE
Urobilinogen, UA POCT: 1 mg/dL

## 2020-08-02 LAB — VH RAPID STREP UCC POCT: Rapid Strep A Screen POCT: POSITIVE — AB

## 2020-08-02 MED ORDER — AMOXICILLIN 500 MG PO CAPS
500.0000 mg | ORAL_CAPSULE | Freq: Two times a day (BID) | ORAL | 0 refills | Status: AC
Start: 2020-08-02 — End: 2020-08-12

## 2020-08-02 NOTE — Progress Notes (Signed)
This provider utilized all appropriate PPE equipment during this visit Gloves, N95, patient wearing face mask.    All provider tools were cleaned with disinfectant solution before and after use on this patient.    Subjective:    Patient ID:   Kristi Branch is a 29 y.o. female who complains of loss of her voice, mucus, and coughing for 7 day(s). She denies a history of chest pain, shortness of breath and wheezing and denies a history of asthma/lung disease. Patient denies smoke cigarettes. Evaluation to date: none. Treatment to date: Zyrtec (started and stopped), Claritin and Mucenex, Robitussin (discussed with OB her symptoms and had been managing with OTC medications. [redacted] weeks pregnant. Patient is scheduled to go to [facility] on October 21st.       The following portions of the patient's history were reviewed and updated as appropriate: allergies, current medications, past medical history, past surgical history and problem list.    Review of Systems   Constitutional: Negative for chills and fever.   HENT: Positive for congestion and sore throat.    Respiratory: Positive for cough. Negative for chest tightness and shortness of breath.    Gastrointestinal: Negative for abdominal pain, constipation, diarrhea, nausea and vomiting.   Genitourinary: Negative for difficulty urinating, dysuria, hematuria and urgency.   Musculoskeletal: Negative for myalgias.   Skin: Negative for rash.   Psychiatric/Behavioral: Negative for sleep disturbance.       Objective:     Vitals:    08/02/20 1345   BP: (!) 148/98   Pulse:    Resp:    Temp:        Physical Exam  Vitals reviewed.   HENT:      Head: Normocephalic and atraumatic.      Right Ear: External ear normal.      Left Ear: External ear normal.      Nose: No congestion or rhinorrhea.      Mouth/Throat:      Pharynx: Posterior oropharyngeal erythema present. No oropharyngeal exudate.   Cardiovascular:      Rate and Rhythm: Normal rate.      Pulses: Normal pulses.   Pulmonary:       Effort: Pulmonary effort is normal.   Abdominal:      Palpations: Abdomen is soft.      Tenderness: There is no abdominal tenderness. There is no right CVA tenderness or left CVA tenderness.      Comments: [redacted] weeks pregnant    Skin:     General: Skin is warm.   Neurological:      Mental Status: She is alert and oriented to person, place, and time.   Psychiatric:         Mood and Affect: Mood normal.          Lab Results from today's visit:  Results     Procedure Component Value Units Date/Time    Rapid Strep A POCT [[id number]]  (Abnormal) Collected: 08/02/20 1344     Updated: 08/02/20 1353     POCT QC Pass     Rapid Strep A Screen POCT Positive    VH POCT UA AUTO [[id number]]  (Normal) Collected: 08/02/20 1344     Updated: 08/02/20 1352     Urine Color POCT Yellow     Urine Clarity POCT Clear     Glucose, UA POCT Negative     Bilirubin, UA POCT Negative     Ketones, UA POCT Negative mg/dL  Specific Gravity, UA POCT >=1.030 mg/dL      Blood, UA POCT  Negative     PH, UA POCT 6.0     Protein, UA POCT Negative mg/dL      Urobilinogen, UA POCT 1.0 mg/dL      Nitrite, UA POCT Negative     Urine Leukocytes POCT Negative          Assessment and Plan:   Kristi Branch was seen today for sore throat.    Diagnoses and all orders for this visit:    Elevated blood pressure reading  -     VH POCT UA AUTO    [redacted] weeks gestation of pregnancy  -     VH POCT UA AUTO    Streptococcal sore throat  -     Rapid Strep A POCT  -     amoxicillin (AMOXIL) 500 MG capsule; Take 1 capsule (500 mg total) by mouth 2 (two) times daily for 10 days        PLAN:    Likely strep pharyngitis and elevated Blood pressure. Patient reports OB aware but will follow up with them. UA was negative for protein and no sign of UTI.   I will empirically treat with abx. Start on amoxicillin BID for 10 days. Complete abx course. I recommended supportive care as follows:  push fluids, rest, throat lozenges, popsicles, warm fluids, gargle warm salt water, drink warm  tea with honey, and vaporizer or mist. Use acetaminophen and/or ibuprofen prn for fever or pain. No red flag s/s on exam. The patient was encouraged to take any and all medications as prescribed, read all pharmacy handouts, and patient instructions.     Vital signs noted no acute management indicated at this time. The patient was instructed to follow up with their primary care provider. The patient was also instructed to seek medical advice if they experience worsening symptoms and to follow up in 2-3 days if no improvement in symptoms.     The patient was instructed to keep all current healthcare appointments. At the conclusion of the visit we reviewed diagnosis, treatment plan, diagnostic tests, prescriptions and follow up instructions pertaining to this visit. All patient questions and concerns regarding the current condition were addressed.      Gustavus Messing, FNP  [facility]  08/02/2020 2:02 PM

## 2020-08-02 NOTE — Patient Instructions (Addendum)
Follow up with OB regarding elevated Blood pressure reading.     Pharyngitis: Strep (Confirmed)    You have had a positive test for strep throat. Strep throat is a contagious illness. It's spread by coughing, kissing, sharing glasses or eating utensils, or by touching others after touching your mouth or nose. Symptoms include throat pain that is worse with swallowing, aching all over, headache, swollen lymph nodes at the front of the neck, and red swollen tonsils sometimes with white patches and fever. It's treated with antibiotic medicine. This should help you start to feel better in 1 to 2 days.   Home care   Rest at home. Drink plenty of fluids so you won't getdehydrated.   No work or school for the first 2 days of taking the antibiotics. You can then return to school or work if you are feeling better, have been taking the antibiotic for at least 24 hours and don't have a fever.   Takeantibiotic medicine for the full 10 days, even if you feel better. This is very important to ensure the infection is treated completely.It's also important to prevent medicine-resistant germs from developing.If you were given an antibiotic shot, you don't need any more antibiotics.   You may use acetaminophenor ibuprofen to control pain or fever, unless another medicine was prescribed for this. Talk with your healthcare provider before taking these medicines if you havechronic liver or kidney disease or if you havehad a stomach ulcer or gastrointestinal bleeding.   Throat lozenges or sprays help reduce pain. Gargling with warm saltwater will also reduce throat pain. Dissolve 1/2 teaspoon of salt in 1 glass of warm water. This may be useful just before meals.   Soft foods and cool or warm fluids are best. Don't eat salty or spicy foods.    Follow-up care  Follow up with your healthcare provider or our staff if you don't get better over the next week.   When to get medical advice  Call your healthcare provider right  away or get immediate medical care if any of these occur:    Fever of 100.15F (38C) or higher, or as directed by your healthcare provider   New or worsening ear pain, sinus pain, or headache   Painful lumps in the back of neck   Stiff neck   Lymph nodesgetting larger or becoming soft in the middle   You have trouble swallowing liquids or you can'topen your mouth wide because ofthroat pain   Signs of dehydration. These include very dark urine or no urine, sunken eyes, and dizziness.   Noisy breathing   Muffled voice   Rash  Call 911  Call 911right away if you:    Have trouble breathing   Can't swallow or talk    Prevention  Here are steps you can take to help prevent an infection:    Wash your hands often with soap and clean, running water for at least 20 seconds.   Don't have close contact with people who have sore throats, colds, or other upper respiratory infections.   Don't smoke, and stay away from secondhand smoke.  StayWell last reviewed this educational content on 12/27/2018   2000-2021 [employer], [place]. All rights reserved. This information is not intended as a substitute for professional medical care. Always follow your healthcare professional's instructions.        Nutrition During Pregnancy   Having a healthy baby depends mostly on you. What you eat matters to your baby and your  health. During pregnancy, you will likely need about 300 more calories per day thanbefore you became pregnant. Each day, try to eat the number of servings listed here for each food group. In addition, cut down on salt and caffeine. Limit the amount of sweets and high-fat foods you eat.Don't smoke or drink alcohol.     Important: See your healthcare provider as often as requested. If you have any questions, be sure to ask them.   Fruits Vegetables Grains & Cereals* Fats & Oils   2 cups  Examples of 1-cupservings:   1 medium apple  1 medium orange  1 medium banana  1cup chopped fruit  1 cup 100% fruit  juice (pasteurized)   1/2 cup dried fruit 2-1/2 to 3 cups  Examples of 1 serving:   2cups raw,leafy greens   1cup raw or cooked cut-up vegetables   1cup 100% vegetable juice (pasteurized)  6 to 8 ounces  Examples of 1-ounce servings:   1 slice bread  1/2 cup cooked rice  1/2 cup cooked cereal   1/2 cup pasta  1ounce cold cereal 6 to 8 teaspoons   Dairy** Protein--- Fluids     3 cups  Examples of 1-cup servings:   1 cup milk  1 cup yogurt  1-1/2 ounces natural cheese   2 ounces processed cheese  5 to6-1/2 ounces  Examples of 1-ounce servings:   1 egg  1ounce of lean meat, poultry, or fish   1/4 cup cookedbeans   1tablespoon peanut butter   1/2 ounce nuts 8 or more 8-ounce glasses   Examples:  Water  Mineral water  Clear soups, broth     *Note: Choose whole grains whenever possible.   ** Note:Try to choose low-fat options; stay away from soft cheeses and unpasteurized milk.   --- Notes:Don't eat raw or undercooked meats, eggs, seafood, fish, and shellfish.Also, some types of fish, such as shark, swordfish, and king mackerel, should not be eaten during pregnancy.Don't eat hot dogs, lunch meats, or cold cuts unless heated to steaming just before being served.Ask your healthcare provider about safe choices.   Prenatal supplements  A prenatal supplement is a pill that you take daily during pregnancy. It helps make sure you're getting the right amount of certain nutrients that are important to your baby. Ask your healthcare provider to help you choose the best one for you. Important nutrients during pregnancy include:    Folic acid.It's best to start taking this supplement 1 month before you start trying to get pregnant. Folic acid helps prevent certain problems in your baby. During pregnancy, you need to take 400 micrograms (mcg) of folic acid every day for the first 2 to 3 months after conception. After that, 600 mcg is needed for a growing baby and placenta.   Iron, calcium, and vitamin D.You may  also be advised to take these supplements during pregnancy. They help keep you and your baby healthy. Take them at different times because calcium makes it hard for the body to absorb iron. Taking iron with orange juice helps to increase its absorption.  StayWell last reviewed this educational content on 05/29/2019   2000-2021 [employer], [place]. All rights reserved. This information is not intended as a substitute for professional medical care. Always follow your healthcare professional's instructions.        Healthy Eating Habits During Pregnancy    It's important to develop healthy eating habits while you are pregnant, for you as well as for your  baby.Here are some ways to stay healthy.  Aim for a healthy weight  A slow, steady rate of weight gain is often best. After the first trimester, you may gain about a pound (0.45 kg) a week. If you were overweight before pregnancy, you need to gain fewer pounds (kilograms). Your healthcare provider can give you a healthy weight goal for your pregnancy.  Don't diet  Now is not the time to diet. You may not get enough of the nutrients you and your baby need. Instead, learn how to be a healthy eater. Start by doing it for your baby. Soon, you may do it for yourself.  Vitamins and supplements  Talk with your healthcare provider about taking these and other prenatal vitamins and supplements.   Iron makes the extra blood you need now.   Calcium and vitamin D help build and keep strong bones.   Folic acid helps prevent certain birth defects.   Iodine helps the thyroid work right.   Some vitamins may not be safe to take. Your healthcare provider will tell you which ones to avoid.  Fluids  Drink at least 8 to 10 cups of fluid daily. Your baby needs fluids. Fluids also decrease constipation, flush out toxins and waste, limit swelling, and help prevent bladder infections. Water is best.Other good choices are:   Water or seltzer water with a slice of lemon or lime  (These can also help ease an upset stomach.)   Clear soups that are low in salt   Low-fat or fat-free milk, soy or rice milk with calcium added   Popsicles or gelatin  Things to avoid  Some things might harm your growing baby. Don't eat or drink:   Alcohol   Unpasteurized dairy foods and juices   Raw or undercooked meat, poultry, fish, or eggs   Unwashed fruits and vegetables   Prepared meats, like deli meats or hot dogs, unless heated until steaming hot   Fish that are high in mercury, like shark, swordfish, king mackerel, tilefish, and albacore tuna  Things to limit  Askyour healthcare providerwhether it's safe to eat or drink:   Caffeine   Artificial sweeteners   Organ meats   Certain types of fish   Fish and shellfish that contain mercury in lower amounts, like shrimp, canned light tuna, salmon, pollock, and catfish  StayWell last reviewed this educational content on 11/28/2016   2000-2021 [employer], [place]. All rights reserved. This information is not intended as a substitute for professional medical care. Always follow your healthcare professional's instructions.

## 2020-09-04 ENCOUNTER — Encounter (INDEPENDENT_AMBULATORY_CARE_PROVIDER_SITE_OTHER): Payer: Self-pay

## 2020-09-04 ENCOUNTER — Ambulatory Visit (INDEPENDENT_AMBULATORY_CARE_PROVIDER_SITE_OTHER): Payer: Medicaid Other | Admitting: Family

## 2020-09-04 VITALS — BP 148/90 | HR 83 | Temp 98.4°F | Resp 16 | Ht 63.0 in | Wt 270.0 lb

## 2020-09-04 DIAGNOSIS — B349 Viral infection, unspecified: Secondary | ICD-10-CM

## 2020-09-04 DIAGNOSIS — J029 Acute pharyngitis, unspecified: Secondary | ICD-10-CM

## 2020-09-04 LAB — VH RAPID STREP UCC POCT: Rapid Strep A Screen POCT: NEGATIVE

## 2020-09-04 NOTE — Progress Notes (Signed)
This provider utilized all appropriate PPE equipment during this visit Gloves, N95, patient wearing face mask.    All provider tools were cleaned with disinfectant solution before and after use on this patient.    Subjective:    Patient ID:   Kristi Branch is a 29 y.o. female who complains of sore throat, congestion, drainage for 4 day(s). She denies a history of chest pain, shortness of breath and wheezing and denies a history of recurrent Strep. Patient denies Strep exposure. Evaluation to date: none. Treatment to date: none. Patient is fully vaccinated with no know covid exposure. Patient is [redacted] weeks pregnant. Patient has been using Robitussin, Amoxicillin(remaing form previous strep throat; took 5 doses of medication), and Mucinex.       The following portions of the patient's history were reviewed and updated as appropriate: allergies, current medications, past medical history, past surgical history and problem list.     Review of Systems   Constitutional: Negative for chills.   HENT: Positive for congestion and sore throat.    Respiratory: Negative for chest tightness.    Gastrointestinal: Negative for abdominal pain, constipation, diarrhea and nausea.   Genitourinary: Negative for difficulty urinating.   Musculoskeletal: Negative for myalgias.   Skin: Negative for rash.   Neurological: Negative for headaches.   Psychiatric/Behavioral: Negative for sleep disturbance.       Objective:     Vitals:    09/04/20 1109   BP: 148/90   Pulse: 83   Resp: 16   Temp: 98.4 F (36.9 C)   SpO2: 98%       Physical Exam  Vitals reviewed.   Constitutional:       Appearance: She is obese.   HENT:      Head: Normocephalic and atraumatic.      Right Ear: No tenderness. A middle ear effusion is present. Tympanic membrane is not erythematous or bulging.      Left Ear: No tenderness.  No middle ear effusion. Tympanic membrane is not erythematous or bulging.      Nose: Congestion present. No rhinorrhea.      Mouth/Throat:       Pharynx: Posterior oropharyngeal erythema present. No oropharyngeal exudate.   Cardiovascular:      Rate and Rhythm: Normal rate.      Pulses: Normal pulses.   Pulmonary:      Effort: Pulmonary effort is normal.      Breath sounds: No wheezing.   Abdominal:      Palpations: Abdomen is soft.   Genitourinary:     Comments: [redacted] weeks pregnant   Neurological:      Mental Status: She is alert and oriented to person, place, and time.   Psychiatric:         Mood and Affect: Mood normal.          Lab Results from today's visit:  Results     Procedure Component Value Units Date/Time    Rapid Strep A POCT [[id number]]  (Normal) Collected: 09/04/20 1116     Updated: 09/04/20 1124     POCT QC Pass     Rapid Strep A Screen POCT Negative           Assessment and Plan:   Kristi Branch was seen today for uri.    Diagnoses and all orders for this visit:    Pharyngitis, unspecified etiology  -     Rapid Strep A POCT    Acute viral syndrome  PLAN:    Likely viral sore throat with viral syndrome. Negative strep. Recommend symptomatic therapy suggested: push fluids, rest, gargle warm salt water, use vaporizer or mist prn, use acetaminophen, antihistamine of choice prn and return office visit prn if symptoms persist or worsen. Discussed with patient medications she can take during her pregnancy for cold symptoms. Patient declined Covid PCR as she is vaccinated and declines any exposure. Call or return to clinic prn if these symptoms worsen or fail to improve as anticipated.    Gustavus Messing, FNP  [facility]  09/04/2020 11:39 AM

## 2020-09-04 NOTE — Patient Instructions (Signed)
Adult Self-Care for Colds  Colds are caused by viruses. They can't be cured with antibiotics. But you can ease symptoms and support your body's efforts to heal itself. No matter which symptoms you have, be sure to:  · Drink plenty of fluids (water or clear soup)  · Stop smoking and drinking alcohol  · Get plenty of rest  · Stay away from secondhand smoke    Understand a fever  · Take your temperature several times a day. If your fever is  100.4° F ( 38°C ) for more than a day, call your healthcare provider.  · Relax, lie down. Go to bed if you want. Just get off your feet and rest. Also drink plenty of fluids to prevent dehydration.  · Take acetaminophen or a nonsteroidal anti-inflammatory drug (NSAID) such as ibuprofen.    Treating a stuffy nose  · Breathe steam or heated humidified air to open blocked nasal passages. Stand in a hot shower or use a vaporizer. Be careful not to get burned by the steam.  · Saline nasal sprays and decongestant tablets help open a stuffy nose. Antihistamines can also help. But they can cause side effects. These include drowsiness and drying of the eyes, nose, and mouth.    Soothe a sore throat and cough  · Gargle every  2 hours with  1/4 teaspoon of salt dissolved in  1/2 cup of warm water. Suck on throat lozenges and cough drops to moisten your throat.  · Cough medicines are available. But it's not clear how well they work.  · Take acetaminophen or an NSAID, such as ibuprofen, to ease throat pain. Follow package directions on how much to use and how often to take the medicine.    Ease digestive problems  · Put fluids back into your body. Take frequent sips of clear liquids such as water or broth. Stay away from drinks that have a lot of sugar in them. These include juices and sodas. These can make diarrhea worse. Older children and adults can drink sports drinks.  · As your appetite returns, you can go back to your normal diet. Ask your healthcare provider if there are any foods you  should not have.    When to call your healthcare provider  When you first notice symptoms, ask your provider if you should take antiviral medicines. Antibiotics should not be taken for colds or flu. Also call your provider if you have any of these symptoms:  · You don't feel better after 7 days  · Belly pain or vomiting  · Symptoms get worse, especially after a period of improvement  · Fever of  100.4° F  ( 38°C) or higher, or fever that doesn't go down with medicine, or as advised by your healthcare provider  · Dizziness or weakness  · Slight shortness of breath or wheezing  · Spotted, red, or very sore throat  · Signs of dehydration:  ? Extreme thirst  ? Dark urine  ? Not urinating often  ? Dry mouth     When to call 911  Call 911 right away if any of these occur:  · Chest pain  · Coughing up blood  · Fast or irregular heartbeat  · Severe trouble breathing  · Sudden confusion, fainting, or loss of consciousness     StayWell last reviewed this educational content on 07/28/2018  © 2000-2021 [employer]. All rights reserved. This information is not intended as a substitute for professional medical care. Always follow your healthcare professional's instructions.          Understanding the Cold Virus  Colds are the most common illness that people get. Most adults get 2 or 3 colds per year, and most children get 5 to 7 colds per year. Colds may be caused by over 200 types of viruses. The most common of these are rhinoviruses (“rhino” refers to the nose).  What causes a cold virus?  All colds start with infection by a virus. You can be infected by more than 1 cold virus at a time. Colds and flu are respiratory illnesses, but they are caused by different viruses. Infection with cold viruses happens when:  · You breathe in a virus from the air. This can happen when someone with a cold sneezes or coughs near you.  · You touch your eyes, nose, or mouth when your hand has a cold virus on it. This can happen if you  touch an object that has the cold virus on it.  What are the symptoms of a cold virus?  You may wonder if you have a cold or the flu. Compared to the flu, cold symptoms come on more gradually. Almost all colds cause a stuffy nose. Other common cold symptoms include:  · Runny nose  · Sneezing  · Sore throat  · Cough  · Fatigue (sometimes)  · Fever (rare)  · Headache (rare)  How is a cold treated?  Colds usually last 5 to 10 days. Treatment focuses on relieving symptoms. Treatments may include:  · Decongestant medicines. Several types of decongestants are available without prescription. These may help reduce stuffy or runny nose symptoms.  · Prescription or over-the-counter nasal sprays. These may help reduce nasal symptoms, including stuffiness.  · Over-the-counter (OTC) pain medicines. These can help with headaches and sore throat.  · Self-care. This includes extra rest, using humidifiers, and drinking more fluids. These help you feel better while you are getting over a cold.  Because viruses cause colds, antibiotics do not help. They do not make a cold shorter or relieve symptoms. Taking antibiotics when you don’t need them can make them work less well when you need them for another illness.  Follow all directions for using medicines, especially when giving them to children. Contact your healthcare provider if you have any questions about using cold medicines safely. Before buying cold medicines, make a list of any prescription medicines you take and ask the pharmacist what OTC cold medicines are safe for you to use.  Can a cold be prevented?  You can help reduce the spread of cold viruses. This can help both you and others avoid getting colds. Follow these tips:  · Wash your hands well anytime you may have come into contact with cold viruses. Wash your hands for at least 20 seconds. When you can’t wash with soap and water, use an alcohol-based hand sanitizer.  · Don’t touch your nose, eyes, or mouth, especially  after touching something that may have a cold virus on it.  · Cover your mouth and nose when you cough or sneeze. Throw away tissues after using them and wash your hands.  · Disinfect things you touch often, such as phones and keyboards.    · Stay home when you have a cold.  What are possible complications of a cold virus?  Colds usually go away by themselves. But you may get another type of infection while you have a cold. These can include:  · Sinus infection  · Lung infection, such as bronchitis or pneumonia  · Ear infection    If you have asthma or chronic bronchitis, a cold can make your condition worse.  When should I call my healthcare provider?  Call your healthcare provider right away if you have any of these:  · Fever of 100.4°F (38°C) or higher, or as directed by your healthcare provider  · Cough, chest pain, or shortness of breath that gets worse  · Symptoms don’t get better or get worse after about 10 days  · Headache, sleepiness, or confusion that gets worse  StayWell last reviewed this educational content on 01/26/2018  © 2000-2021 [employer]. All rights reserved. This information is not intended as a substitute for professional medical care. Always follow your healthcare professional's instructions.

## 2020-11-05 ENCOUNTER — Emergency Department
Admission: EM | Admit: 2020-11-05 | Discharge: 2020-11-05 | Disposition: A | Discharge status: Home / Self Care | Payer: Medicaid Other | Attending: Emergency Medicine | Admitting: Emergency Medicine

## 2020-11-05 DIAGNOSIS — Z3A29 29 weeks gestation of pregnancy: Secondary | ICD-10-CM | POA: Insufficient documentation

## 2020-11-05 DIAGNOSIS — O98513 Other viral diseases complicating pregnancy, third trimester: Secondary | ICD-10-CM | POA: Insufficient documentation

## 2020-11-05 DIAGNOSIS — U071 COVID-19: Secondary | ICD-10-CM | POA: Insufficient documentation

## 2020-11-05 LAB — VH XPERT XPRESS © COV-2/FLU/RSV PLUS
Date of Onset: 20220104
Does patient reside in a congregate care setting?: NEGATIVE
Influenza A RNA: NEGATIVE
Influenza B RNA: NEGATIVE
Is patient employed in a healthcare setting?: NEGATIVE
RSV RNA: NEGATIVE
SARS-CoV-2 RNA: POSITIVE — CR

## 2020-11-05 LAB — BASIC METABOLIC PANEL
Anion Gap: 11.8 mMol/L (ref 7.0–18.0)
BUN / Creatinine Ratio: 6.1 Ratio — ABNORMAL LOW (ref 10.0–30.0)
BUN: 4 mg/dL — ABNORMAL LOW (ref 7–22)
CO2: 22 mMol/L (ref 20–30)
Calcium: 8.3 mg/dL — ABNORMAL LOW (ref 8.5–10.5)
Chloride: 108 mMol/L (ref 98–110)
Creatinine: 0.66 mg/dL (ref 0.60–1.20)
EGFR: 118 mL/min/{1.73_m2} (ref 60–150)
Glucose: 102 mg/dL — ABNORMAL HIGH (ref 71–99)
Osmolality Calculated: 273 mOsm/kg — ABNORMAL LOW (ref 275–300)
Potassium: 3.8 mMol/L (ref 3.5–5.3)
Sodium: 138 mMol/L (ref 136–147)

## 2020-11-05 LAB — CBC AND DIFFERENTIAL
Basophils %: 0.1 % (ref 0.0–3.0)
Basophils Absolute: 0 10*3/uL (ref 0.0–0.3)
Eosinophils %: 0.4 % (ref 0.0–7.0)
Eosinophils Absolute: 0 10*3/uL (ref 0.0–0.8)
Hematocrit: 35.7 % — ABNORMAL LOW (ref 36.0–48.0)
Hemoglobin: 11.9 gm/dL — ABNORMAL LOW (ref 12.0–16.0)
Lymphocytes Absolute: 0.9 10*3/uL (ref 0.6–5.1)
Lymphocytes: 14.6 % — ABNORMAL LOW (ref 15.0–46.0)
MCH: 25 pg — ABNORMAL LOW (ref 28–35)
MCHC: 33 gm/dL (ref 32–36)
MCV: 75 fL — ABNORMAL LOW (ref 80–100)
MPV: 7.5 fL (ref 6.0–10.0)
Monocytes Absolute: 0.5 10*3/uL (ref 0.1–1.7)
Monocytes: 7.8 % (ref 3.0–15.0)
Neutrophils %: 77.1 % (ref 42.0–78.0)
Neutrophils Absolute: 4.6 10*3/uL (ref 1.7–8.6)
PLT CT: 296 10*3/uL (ref 130–440)
RBC: 4.77 10*6/uL (ref 3.80–5.00)
RDW: 15 % — ABNORMAL HIGH (ref 11.0–14.0)
WBC: 6 10*3/uL (ref 4.0–11.0)

## 2020-11-05 LAB — THYROID STIMULATING HORMONE (TSH), REFLEX ON ABNORMAL TO FREE T4, SERUM: TSH: 1.64 u[IU]/mL (ref 0.40–4.20)

## 2020-11-05 NOTE — Discharge Instructions (Signed)
Coronavirus Disease 2019 (COVID-19): Overview  Coronavirus disease 2019 (COVID-19) is an illness that infects the lungs. It's caused by a type of coronavirus called SARS-CoV-2. There are many types of coronaviruses. They are a very common cause of colds and bronchitis. They can cause a lung infection called pneumonia. Symptoms can range from mild to severe. Some people have no symptoms. These viruses are also found in some animals.   The virus that causes COVID-19 changes (mutates) all the time. This is what all viruses do. It leads to different versions of a virus. These are called variants. COVID-19 variants may spread more easily from person to person. They may cause milder symptoms. Or they may cause more severe symptoms.    The virus spreads and infects people easily. It can infect a person more easily if they are not immune to it. The virus most often spreads through droplets of fluid that a person coughs or sneezes into the air. It may be spread to you if you touch a surface with the virus on it and then touch your eyes, nose, or mouth.      To help prevent spreading the infection, wash your hands often, or use an alcohol-based hand sanitizer.     For the latest information from the CDC:    · Go to the CDC website  · Call 800-CDC-INFO (800-232-4636)    What are the symptoms of COVID-19?  Some people have no symptoms. Some have mild symptoms. And other people may have severe symptoms. Types of symptoms can vary from person to person. They may appear 2 to 14 days after contact with the virus. They can include:   · Fever  · Chills  · Coughing  · Trouble breathing or feeling short of breath  · Sore throat  · Stuffy or runny nose  · Headache  · Body aches  · Tiredness  · Nausea, vomiting, diarrhea, or abdominal pain  · New loss of sense of smell or taste  Check your symptoms with the CDC’s Coronavirus Self-Checker.   What are possible complications of COVID-19?  The virus can cause an infection in the lungs.  This is called pneumonia. In some cases, this can lead to death. Experts are still learning more about COVID-19 complications. Many other complications are possible. They include:   · Low blood pressure  · Kidney failure  · Inflammation of the brain or heart  · Rashes  Some people are at higher risk for complications. This includes:   · Older adults  · People with heart or lung disease  · People with diabetes or kidney disease  · People with health conditions that suppress the immune system  · People who take medicines that suppress the immune system  Rarely, a child may have a severe complication. This is called multisystem inflammatory syndrome in children (MIS-C). MIS-C seems to be like Kawasaki disease. This is a rare illness. It causes inflammation of blood vessels and body organs. MIS can also happen in adults. But this is less common.     How is COVID-19 diagnosed?  Your healthcare provider will ask:  · What symptoms you have  · Where you live  · If you’ve traveled recently  · If you’ve had contact with sick people  · If you are vaccinated against COVID-19  · If you have had COVID-19   You may have 1 of these tests for COVID-19:  · Viral (molecular) test. You may also hear this called a PCR or RT-PCR test.   Viral tests are very accurate. A viral test looks for the genetic material (RNA) of the SARS-CoV-2 virus. There are a few ways to do this. A swab may be wiped inside your nose or throat. Or a long swab may be put into your nose down to the back of your throat. Or a sample of your saliva may be taken. Your test results may be back in 45 minutes to a few hours. This depends on the type of test. Some tests must be sent to a lab. These can take several days for the results. Test kits you can use at home are now available. Some of these need a prescription. If you use a home kit, follow the instructions in the kit closely. Some kits show results quickly at home. Others must be sent to a lab for the  results.  · Antigen test. This can find proteins from the SARS-CoV-2 virus. A swab may be wiped inside your nose or throat. Or a long swab may be put into your nose down to the back of your throat. Some results are back within 15 to 60 minutes. This depends on the type of test. Positive results are very accurate. But false positive results can happen. And the results can be negative even in people with COVID-19. This is more common in places where not many people have the virus. Antigen tests are more likely to miss a COVID-19 infection than a viral (molecular) test. If your antigen test is negative but you have symptoms of COVID-19, you may need to have a viral test.  If your provider thinks or confirms that you have COVID-19, you may have other tests. These tests may include:   · Antibody blood test. This type of test can show if you had the virus in the past. It shows antibodies for the virus in the blood. The accuracy of these tests varies. And they are not available everywhere. An antibody test may not show if you have an infection right now. This is because it can take up to a few weeks for your body to make antibodies. None of the antibody tests can yet be used to tell if a person is immune to the virus.  · Sputum culture. If you have a wet cough, you may be asked to cough up a bit of mucus (sputum) from your lungs. This is tested for the virus. It may be tested for pneumonia.  · Imaging tests. You may have a chest X-ray or CT scan.  Can you get COVID-19 again?  Yes, you can get COVID-19 more than once. You may have not gotten immunity. You could have lost the immunity. Or you may get COVID-19 from a different strain (variant) of the virus that you are not immune to. But the COVID-19 vaccine helps people who had COVID-19 lower their risk of having the illness again.   Vaccines for COVID-19  The FDA and CDC advise vaccines to help prevent COVID-19. The vaccines can also reduce how severe the illness is. It can  keep you from needing to go to the hospital.  And it can prevent the spread of the virus to other people. No vaccine is 100% effective at preventing an illness. But getting a vaccine is important. The Pfizer vaccine is available for people as young as age 5. Pregnant or breastfeeding people can have the vaccine. Ask your healthcare provider which vaccine may be best for you.   The vaccines are given as a shot (injection). This   is most often done in a muscle in the upper arm. There is a 1-dose vaccine. This is from [employer]. There are 2-dose vaccines. These are from Pfizer and Moderna. For a 2-dose vaccine, the second dose is given several weeks after the first. Some people may need a third dose of Pfizer or Moderna.   Who needs a third dose of Pfizer or Moderna?  A third dose of the Pfizer or Moderna vaccine may be needed for some people. This is for people who have a very weak immune system. The third dose is part of their first (primary) series. It's not a booster. This can happen if you had a solid organ transplant. It can be caused by other conditions or treatments. This third dose is to help a person with a weak immune system build up better protection against the virus. It's given at least 28 days after the second dose. Talk with your healthcare provider about your health risks to see if you need a third dose as part of your primary series.   COVID-19 vaccine booster shots  A booster shot of the Pfizer or Moderna vaccines are advised for many people. . The booster shot is to be given at least 6 months after your primary series. Talk with your healthcare provider about how this applies to you. The CDC says these people should get a Pfizer or Moderna booster shot:   · People age 65 or older  · People age 18 or older living in long-term care facilities  · People age 18 or older with health conditions that put them at high risk for severe Covid-19  · People age 18 or older who work in settings that put  them at risk for COVID-19. Examples are healthcare and other essential workers.  · People age 18 or older who live in group settings that put them at risk for COVID-19. This includes shelters.      A booster shot of the 1-dose [employer] vaccine is also advised for people ages 18 and older who got their first dose 2 or more months ago.   You may be able to choose which vaccine you get as a booster. This is called mix and match. Talk with your healthcare provider to learn more.   How is COVID-19 treated?  The most proven treatments right now are those to help your body while it fights the virus. This is known as supportive care. It includes:   · Getting rest.This helps your body fight the illness.  · Drinking fluids. Try to drink 6 to 8 glasses of fluids every day. Ask your provider which drinks are best for you. Don't have drinks with caffeine or alcohol.  · Taking over-the-counter (OTC) medicine.  These are used to help ease pain and reduce fever. Ask your provider which OTC medicine is safe for you to use.  You may need to stay in the hospital for severe illness. Your care may include:   · IV (intravenous) fluids. These are given through a vein. This helps to replace fluids in your body.  · Oxygen. You may be given supplemental oxygen. Or you may be put on a breathing machine (ventilator). This is done so you get enough oxygen in your body.  · Prone positioning. Your healthcare team may regularly turn you on your stomach. This is called prone positioning. It helps increase the amount of oxygen you get to your lungs. Follow their instructions on position changes while you're   in the hospital. Also follow their advice on the best positions to help your breathing once you go home.  · Remdesivir. This is an antiviral medicine. The FDA has approved it for use on COVID-19. It works by stopping the spread of the SARS-CoV-2 virus in the body. It's approved only for people who are in the hospital. It's for people 12  years and older who weigh at least 88 pounds (40 kgs). In some cases, it may also be used for people younger than 12 years or who weigh less than 88 pounds (40 kgs).  · Steroids or other anti-inflammatory medicines.  These are used to lessen the intense inflammation that some people with COVID-19 can have. The inflammation can lead to more trouble breathing. It can cause other complications or death.  · COVID-19 convalescent plasma. Plasma is the liquid part of blood. People who had COVID-19 may be asked to donate plasma. This is called COVID-19 convalescent plasma. The plasma may have antibodies. These can help fight COVID-19 in people who are very ill with it. The FDA has approved it for emergency use in some people with severe but early COVID-19. Ask your provider if you qualify to donate.  · Monoclonal antibody therapy. The FDA approved this for emergency use in some people. They must have a positive COVID-19 viral test. They must have mild to moderate symptoms. They can’t be in the hospital. It's approved for people 12 years and older. They must weigh at least 88 pounds (40 kgs). And they must be at high risk for severe COVID-19 and a hospital stay. This includes people who are 65 years and older. And it includes people with some chronic conditions. This therapy is not approved for people who are in the hospital with COVID-19 or who need oxygen. Your healthcare team will tell you if you qualify.  Are you at risk for COVID-19?  You are at risk for COVID-19 if any of these apply to you:  · You live in an area with cases of COVID-19  · You traveled to an area with cases of COVID-19  · You had close contact with someone who had COVID-19  Close contact means being within 6 feet.   Keep in mind that COVID-19 may be spread by people who do not show symptoms.   Last updated: 08/30/2020   [employer] last reviewed this educational content on 06/28/2020    © 2000-2021 [employer]. All rights reserved. This  information is not intended as a substitute for professional medical care. Always follow your healthcare professional's instructions.

## 2020-11-05 NOTE — ED Notes (Signed)
Pt is hard stick. Unable to obtain bloodwork.

## 2020-11-05 NOTE — ED Provider Notes (Signed)
History     Chief Complaint   Patient presents with    PUI     She presents for evaluation of her chills, body aches, and fatigue gradual onset over the last week. She has had a cough. She denies shortness of breath. She denies vomiting or diarrhea. She denies sore throat. She denies chest pain. She denies palpitations. She denies lower extremity edema or cramping.  She denies dizziness. She denies vision changes. She denies hearing changes. She denies back pain. She denies neck pain. She denies loss of her sense of taste or smell. She denies rash. She has not noticed changes in the color of her urine. She denies noticing any black stools or blood in her stools. She denies recent falls or trauma. She states that she is [redacted] weeks pregnant. She has felt her baby kicking today. This is her 3rd pregnancy. She has 2 other children who were born via C-section. She has been following with [facility] throughout her pregnancy. Her OB is planning to perform a C-section 2 week early due to the patient's elevated blood pressure. Her blood pressure was elevated in at least one other pregnancy. She reports that she had blood work earlier in her pregnancy and was told to take iron following her lab work. She has not noticed any vaginal bleeding or spotting. She denies a known history of thyroid trouble. She denies trouble sleeping. No one else is sick in her home.            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 Treated in ED.    Late prenatal care     32 weeks    Obesity        Past Surgical History:   Procedure Laterality Date    CESAREAN SECTION N/A 08/07/2015    Procedure: CESAREAN SECTION;  Surgeon: Levander Campion, MD;  Location: [facility];  Service: Obstetrics;  Laterality: N/A;    CESAREAN SECTION, REPEAT Bilateral 05/07/2019    Procedure: CESAREAN SECTION, REPEAT;  Surgeon: Truddie Crumble, MD;  Location:  [facility];  Service: Obstetrics;  Laterality: Bilateral;       Family History   Problem Relation Age of Onset    COPD Mother     Asthma Mother     Hypertension Paternal Grandmother     No known problems Father        Social  Social History     Tobacco Use    Smoking status: Never Smoker    Smokeless tobacco: Never Used   Haematologist Use: Never used   Substance Use Topics    Alcohol use: No    Drug use: No       .     No Known Allergies    Home Medications     Med List Status: In Progress Set By: Felecia Jan, RN at 11/05/2020  1:42 PM                Prenatal MV-Min-Fe Fum-FA-DHA (PRENATAL 1 PO)     Take by mouth           Review of Systems   Constitutional: Positive for chills and fatigue.        Body aches   HENT: Negative for hearing loss and sore throat.  Denies loss of her sense of taste or smell.   Eyes: Negative for visual disturbance.   Respiratory: Positive for cough. Negative for shortness of breath.    Cardiovascular: Negative for chest pain, palpitations and leg swelling.   Gastrointestinal: Negative for diarrhea and vomiting.   Genitourinary: Negative for vaginal bleeding.        Has not noticed changes in the color of her urine. [redacted] week pregnant. G3P2A0. 2 prior c-sections. States pregnancy complicated by her elevated blood pressure. Planning c-section 2 weeks prior to term.   Musculoskeletal: Negative for back pain and neck pain.   Skin: Negative for rash.   Neurological: Negative for dizziness.   Hematological:        Told to take iron following blood work completed earlier in her pregnancy.   Psychiatric/Behavioral: Negative for sleep disturbance.   All other systems reviewed and are negative.    Physical Exam    BP: 105/57, Heart Rate: (!) 134, Temp: 98.2 F (36.8 C), Resp Rate: 16, SpO2: 98 %, Weight: 125 kg    Physical Exam  Vitals and nursing note reviewed.   Constitutional:       General: She is not in acute distress.     Appearance: She is well-developed.  She is obese. She is not diaphoretic.   HENT:      Head: Normocephalic and atraumatic.      Right Ear: External ear normal.      Left Ear: External ear normal.   Eyes:      General: No scleral icterus.        Right eye: No discharge.         Left eye: No discharge.      Conjunctiva/sclera: Conjunctivae normal.   Cardiovascular:      Rate and Rhythm: Regular rhythm. Tachycardia present.      Heart sounds: No murmur heard.  No friction rub. No gallop.    Pulmonary:      Effort: Pulmonary effort is normal. No respiratory distress.      Breath sounds: Normal breath sounds. No stridor. No wheezing or rales.   Chest:      Chest wall: No tenderness.   Abdominal:      General: Bowel sounds are normal.      Palpations: There is no mass.      Tenderness: There is no abdominal tenderness. There is no guarding or rebound.      Comments: Gravid uterus.   Musculoskeletal:         General: No tenderness.      Cervical back: Neck supple.   Skin:     Coloration: Skin is not pale.      Findings: No erythema or rash.   Neurological:      Mental Status: She is alert.      Motor: No abnormal muscle tone.      Coordination: Coordination normal.   Psychiatric:         Behavior: Behavior normal.         Thought Content: Thought content normal.           MDM and ED Course     ED Medication Orders (From admission, onward)    None             MDM  Number of Diagnoses or Management Options  COVID  Diagnosis management comments: The patient tested positive for COVID-19. She is not in any respiratory distress. Her O2 saturation is 98% on room  air.     Fetal heart tones 160 per nurse. She was not orthostatic.    The patient's presentation is suggestive of COVID-19 infection. The patient is clinically well appearing without dyspnea and with normal work of breathing. Serious or potentially life-threatening causes of shortness of breath like pneumonia with significant hypoxemia and/or sepsis, pneumothorax, or cardiac pathology were considered in the  differential but seem unlikely. The patient appears appropriate for outpatient management with symptomatic treatment and primary care physician follow-up. Warned to return immediately for worsening symptoms or any concerns .Patient was well-appearing at time of disposition.  Diagnostic impression and plan were discussed with the patient and/or family.  If ordered the results of lab/radiology tests were discussed with the patient and/or family. All questions were answered and concerns addressed.                   Procedures    Clinical Impression & Disposition     Clinical Impression  Final diagnoses:   COVID        ED Disposition     ED Disposition Condition Date/Time Comment    Discharge  Sun Nov 05, 2020  3:18 PM [name] discharge to home/self care.    Condition at disposition: Stable           Discharge Medication List as of 11/05/2020  3:18 PM           Treatment Team: Scribe: Jacklynn Bue, MD  11/05/20 [phone]

## 2020-11-07 LAB — ECG 12-LEAD
P Wave Axis: 39 deg
P-R Interval: 124 ms
Patient Age: 29 years
Q-T Interval(Corrected): 437 ms
Q-T Interval: 316 ms
QRS Axis: 44 deg
QRS Duration: 86 ms
T Axis: 2 years
Ventricular Rate: 115 //min

## 2020-11-29 ENCOUNTER — Emergency Department
Admission: EM | Admit: 2020-11-29 | Discharge: 2020-11-29 | Disposition: A | Discharge status: Home / Self Care | Payer: Medicaid Other | Source: Ambulatory Visit | Attending: Obstetrics and Gynecology | Admitting: Obstetrics and Gynecology

## 2020-11-29 ENCOUNTER — Encounter: Payer: Self-pay | Admitting: Obstetrics and Gynecology

## 2020-11-29 DIAGNOSIS — O99213 Obesity complicating pregnancy, third trimester: Secondary | ICD-10-CM | POA: Insufficient documentation

## 2020-11-29 DIAGNOSIS — Z3A32 32 weeks gestation of pregnancy: Secondary | ICD-10-CM | POA: Insufficient documentation

## 2020-11-29 DIAGNOSIS — R600 Localized edema: Secondary | ICD-10-CM | POA: Diagnosis present

## 2020-11-29 DIAGNOSIS — O26893 Other specified pregnancy related conditions, third trimester: Secondary | ICD-10-CM | POA: Insufficient documentation

## 2020-11-29 HISTORY — DX: Essential (primary) hypertension: I10

## 2020-11-29 LAB — VH URINE DRUG SCREEN
Propoxyphene: NEGATIVE
Urine Amphetamine: NEGATIVE
Urine Barbiturates: NEGATIVE
Urine Benzodiazepines: NEGATIVE
Urine Buprenorphine: NEGATIVE
Urine Cannabinoids: NEGATIVE
Urine Cocaine: NEGATIVE
Urine Creatinine Random: 135 mg/dL
Urine Ecstasy Screen: NEGATIVE
Urine Fentanyl Screen: NEGATIVE
Urine Methadone Screen: NEGATIVE
Urine Opiates: NEGATIVE
Urine Oxycodone: NEGATIVE
Urine Phencyclidine: NEGATIVE
Urine Specific Gravity: 1.037 (ref 1.001–1.040)
pH, Urine: 5.8 pH (ref 5.0–8.0)

## 2020-11-29 NOTE — OB ED Provider Note (Signed)
OB ED EVALUATION NOTE    Date Time: 11/29/20 8:16 PM  Patient Name: Kristi Branch, Kristi Branch  Patient Medical Record Number:  [id number]  Patient Room/Bed:  2722/2722-A    Chief Complaint     Complains of bipedal edema mostly in the evenings    Subjective:     Patient is a 30 y.o., G3P2002 @ [redacted]w[redacted]d who presents to [facility] ED complains of swelling in her feet and ankles after being on her feet most of the day.  No headache, amaurotic scotomata, right upper quadrant pain.  Fetus has been active.  No vaginal bleeding or leaking of fluid.  No fevers, chills, nausea, vomiting, shortness of breath, chest pain.    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 Treated in ED.    Hypertension     chronic     Obesity      Past Surgical History:   Procedure Laterality Date    CESAREAN SECTION N/A 08/07/2015    Procedure: CESAREAN SECTION;  Surgeon: Levander Campion, MD;  Location: [facility];  Service: Obstetrics;  Laterality: N/A;    CESAREAN SECTION, REPEAT Bilateral 05/07/2019    Procedure: CESAREAN SECTION, REPEAT;  Surgeon: Truddie Crumble, MD;  Location: [facility];  Service: Obstetrics;  Laterality: Bilateral;     Social History     Socioeconomic History    Marital status: Single   Tobacco Use    Smoking status: Never Smoker    Smokeless tobacco: Never Used   Haematologist Use: Never used   Substance and Sexual Activity    Alcohol use: No    Drug use: No    Sexual activity: Yes     Partners: Male     Family History   Problem Relation Age of Onset    COPD Mother     Asthma Mother     Hypertension Paternal  Grandmother     No known problems Father      No Known Allergies    Available prenatal and medical records are reviewed as available.    Pt was screened for Covid 19 viral symptoms. Pt denies fever, cough, sob, body aches.     Review of Systems:     All other systems are negative except as noted above and in the HPI.    Medications:     Medications Prior to Admission   Medication Sig Dispense Refill Last Dose    ferrous sulfate 325 (65 FE) MG tablet Take 325 mg by mouth every morning with breakfast       Prenatal MV-Min-Fe Fum-FA-DHA (PRENATAL 1 PO) Take by mouth          Physical Exam:     Patient Vitals for the past 24 hrs:   BP Temp Temp src Pulse Resp SpO2 Height Weight   11/29/20 1933 129/78   (!) 107       11/29/20 1918 127/82   96  11/29/20 1900 139/82   97       11/29/20 1848 (!) 138/91 97.8 F (36.6 C) Oral (!) 113 22 97 % 1.6 m (5\' 3" ) 134.3 kg (296 lb)       No intake or output data in the 24 hours ending 11/29/20 2016    Body mass index is 52.43 kg/m.    Physical Exam:  General appearance -morbidly obese white female resting quietly on the stretcher in no apparent distress  Mental status -alert, oriented, normal behavior, speech, dress, motor activity, and thought processes  Chest -symmetric air entry on observation, no tachypnea, retractions or cyanosis  Abdomen -morbidly obese pannus that is nontender.  Normal bowel sounds.  No right upper quadrant tenderness  Uterus -soft, gravid, non-tender. Size consistent with gestational age  Breasts -not examined  Pelvic -not performed  Rectal -rectal exam not indicated  Back exam -not examined  Extremities -no clubbing, cyanosis, normal color, temperature and sensation, no edema, redness or tenderness in the calves or thighs, Homan's sign negative bilaterally  Skin -normal coloration and turgor, no rashes, no suspicious skin lesions noted    Last Cervical Exam:       NST:     NST reviewed:  see separate NST note.     Labs:     Results      Procedure Component Value Units Date/Time    Urine Drug Screen [[id number]] Collected: 11/29/20 1848    Specimen: Urine, Random Updated: 11/29/20 2015     Urine Creatinine Random 135 mg/dL      pH, Urine 5.8 pH      Urine Specific Gravity 1.037     Urine Amphetamine Negative     Urine Barbiturates Negative     Urine Benzodiazepines Negative     Urine Cannabinoids Negative     Urine Cocaine Negative     Urine Methadone Screen Negative     Urine Opiates Negative     Urine Oxycodone Negative     Urine Phencyclidine Negative     Propoxyphene Negative     Urine Buprenorphine Negative     Urine Ecstasy Screen Negative     Urine Fentanyl Screen Negative          Lab Results   Component Value Date    WBC 6.0 11/05/2020    HGB 11.9 (L) 11/05/2020    PLT 296 11/05/2020    NA 138 11/05/2020    K 3.8 11/05/2020    BUN 4 (L) 11/05/2020    CREAT 0.66 11/05/2020    AST 13 02/04/2019    ALB 2.9 (L) 02/04/2019       ABO Rh   Date Value Ref Range Status   05/07/2019 O Positive  Final     Rubella IgG   Date Value Ref Range Status   03/09/2019 immune  Final     Hepatitis B Surface Antigen   Date Value Ref Range Status   03/09/2019 Negative  Final     GBS Transcribed   Date Value Ref Range Status   04/15/2019 Negative  Final     RPR   Date Value Ref Range Status   03/09/2019 Nonreactive Borderline, Nonreactive Final       Imaging Studies:     No results found.    Course in OB ED:      NST is reactive with a baseline of 130 bpm.  No uterine activity is noted.  Initial blood pressure was 138/91.  Subsequent blood pressures  have been low over the last blood pressure was 129/78.  No bipedal edema seen at this time.    Active Problem List:     Active Hospital Problems    Diagnosis    [redacted] weeks gestation of pregnancy    Pedal edema       Assessment:      30 y.o. O1H0865 @ [redacted]w[redacted]d    Normal pedal edema pregnancy  o Blood pressures in normal range.  No symptoms of preeclampsia.    Plan:      Patient is instructed to drink fluid at least  60 ounces a day and to spend more time with her feet elevated.   Reviewed signs and symptoms of preeclampsia with patient.   The patient is advised that if she has any of the following signs/symptoms that she is to call her obstetrician or return to the OBED:  Contractions that are regular and getting stronger. Fluid coming from the vagina, with or without contractions. Vaginal bleeding heavy enough to require a sanitary pad. Decreased fetal movement.   Patient is instructed to follow-up with her OB provider -[facility]   She is instructed to return to the [facility] ED if she has additional, persistent or worsening problems, complications or concerns.      Frutoso Schatz. Johna Sheriff, M.D., F.A.C.O.G.  OB/GYN Hospitalist    [facility]  [phone]  11/29/2020  20:16     This note may have been generated by a voice recognition system and may contain inherent errors or omissions not intended by the user. Grammatical errors, random word insertions, deletions, pronoun errors and incomplete sentences are occasional consequences of this technology due to software limitations. Not all errors are caught or corrected. If there are questions or concerns about the content of this note or information contained within the body of this dictation they should be addressed directly with the author for clarification.

## 2020-11-29 NOTE — Procedures (Signed)
OB ED NST Note    Indication: Fetal edema    Fetal Assessment:  Recorded by: External fetal monitor  Fetal Movement: Present  Fetal HR (bpm): 130 bpm  Fetal HR Baseline Range: Normal  Fetal HR Variability: Moderate  Fetal HR Accelerations: Present  Fetal HR Decelerations: Absent  Fetal HR Category: 1    Contractions:  Method: External tocodynamometer  Contractions: Absent  Frequency: N/A  Duration: N/A  Pattern: N/A  Strength: N/A  Resting tone: Soft    BPP: Not done    AFI: Not done    Time on the monitor:> 20 minutes    Frutoso Schatz. Johna Sheriff, M.D., F.A.C.O.G.  OB/GYN Hospitalist    [facility]  [phone]  11/29/2020  20:11     This note may have been generated by a voice recognition system and may contain inherent errors or omissions not intended by the user. Grammatical errors, random word insertions, deletions, pronoun errors and incomplete sentences are occasional consequences of this technology due to software limitations. Not all errors are caught or corrected. If there are questions or concerns about the content of this note or information contained within the body of this dictation they should be addressed directly with the author for clarification.

## 2020-11-29 NOTE — Discharge Instr - AVS First Page (Signed)
Call your OB Provider, or return to the hospital, if you have any of the following signs:     If more than 36 weeks, regular painful contractions every 3-5 minutes for one hour that increase in strength.     If less than 36 weeks, 6 or more contractions in an hour.     Persistent nausea, vomiting or diarrhea; accompanied by the inability to keep liquids down.     Leaking of amniotic fluid, or water breaking; this can be a gush or small trickle.     Vaginal bleeding, especially any bright red bleeding like a period or passing clots (it is normal to have spotting after vaginal exam or intercourse).     Concern about a decrease in your baby's movements.     Temperature greater than 100.4(F) orally.     Severe headache which is not relieved 60 minutes after taking Tylenol (Acetaminophen); Blurry vision or spots before your eyes, not just when you suddenly stand up; Severe heartburn or pain on the upper rights side of your abdomen that is not relieved by an antacid; Sudden increase in swelling in your face, hands, or feet.

## 2020-12-09 ENCOUNTER — Encounter (INDEPENDENT_AMBULATORY_CARE_PROVIDER_SITE_OTHER): Payer: Self-pay

## 2020-12-09 ENCOUNTER — Ambulatory Visit (INDEPENDENT_AMBULATORY_CARE_PROVIDER_SITE_OTHER): Payer: Medicaid Other

## 2020-12-09 ENCOUNTER — Other Ambulatory Visit
Admission: RE | Admit: 2020-12-09 | Discharge: 2020-12-09 | Disposition: A | Discharge status: Home / Self Care | Payer: Medicaid Other | Source: Ambulatory Visit

## 2020-12-09 VITALS — BP 145/92 | HR 96 | Temp 97.5°F | Resp 20 | Ht 63.0 in | Wt 297.9 lb

## 2020-12-09 DIAGNOSIS — J029 Acute pharyngitis, unspecified: Secondary | ICD-10-CM

## 2020-12-09 LAB — VH RAPID STREP UCC POCT: Rapid Strep A Screen POCT: NEGATIVE

## 2020-12-09 MED ORDER — PHENOL 1.4 % MT LIQD
1.0000 | OROMUCOSAL | 0 refills | Status: DC | PRN
Start: 2020-12-09 — End: 2020-12-19

## 2020-12-09 NOTE — Progress Notes (Signed)
Throat culture collected and sent to lab.

## 2020-12-09 NOTE — Progress Notes (Signed)
Strep

## 2020-12-09 NOTE — Progress Notes (Signed)
Subjective:    Patient ID:     Kristi Branch is a pleasant 30 y.o. female who presents for pharyngitis for 1 day.    Denies allergies, history of asthma.  She is [redacted] weeks pregnant currently.  States she is also having a postnasal drip with a sore throat.  She has not tried anything over-the-counter yet.  She has not been exposed any sick contacts to her knowledge.  She is partially vaccinated against COVID.    The following portions of the patient's history were reviewed and updated as appropriate: allergies, current medications, past medical history, past surgical history and problem list.    Review of Systems   Constitutional: Negative for fatigue and fever.   HENT: Positive for postnasal drip and sore throat. Negative for congestion, ear discharge, ear pain, rhinorrhea, sinus pressure, sinus pain and trouble swallowing.    Eyes: Negative for pain, redness and itching.   Respiratory: Negative for cough, shortness of breath and wheezing.    Cardiovascular: Negative for chest pain and palpitations.   Gastrointestinal: Negative for constipation, diarrhea, nausea and vomiting.   Genitourinary: Negative for dysuria.   Musculoskeletal: Negative for myalgias.   Skin: Negative for rash.   Allergic/Immunologic: Negative for immunocompromised state.   Neurological: Negative for dizziness, weakness and headaches.   Psychiatric/Behavioral: Negative for confusion.      Objective:     Vitals:    12/09/20 1032   BP: (!) 145/92   Pulse: 96   Resp: 20   Temp: 97.5 F (36.4 C)   TempSrc: Tympanic   SpO2: 98%   Weight: 135.1 kg (297 lb 14.4 oz)   Height: 1.6 m (5\' 3" )      Physical Exam  Constitutional:       General: She is not in acute distress.     Appearance: Normal appearance. She is not ill-appearing, toxic-appearing or diaphoretic.   HENT:      Nose: No congestion or rhinorrhea.      Mouth/Throat:      Mouth: Mucous membranes are moist.      Pharynx: No oropharyngeal exudate or posterior oropharyngeal erythema.    Eyes:      General:         Right eye: No discharge.         Left eye: No discharge.      Conjunctiva/sclera: Conjunctivae normal.   Cardiovascular:      Rate and Rhythm: Normal rate and regular rhythm.   Pulmonary:      Effort: Pulmonary effort is normal. No respiratory distress.      Breath sounds: Normal breath sounds. No stridor. No wheezing, rhonchi or rales.   Abdominal:      Tenderness: There is no guarding.   Musculoskeletal:         General: Normal range of motion.      Cervical back: Normal range of motion and neck supple. No rigidity.   Skin:     General: Skin is warm and dry.   Neurological:      General: No focal deficit present.      Mental Status: She is alert and oriented to person, place, and time.   Psychiatric:         Mood and Affect: Mood normal.         Behavior: Behavior normal.         Thought Content: Thought content normal.         Judgment: Judgment normal.  Results     Procedure Component Value Units Date/Time    Rapid Strep A POCT [[id number]]  (Normal) Collected: 12/09/20 1034     Updated: 12/09/20 1047     POCT QC Pass     Rapid Strep A Screen POCT Negative          Assessment and Plan:     Orders Placed This Encounter   Procedures    Throat Culture    Rapid Strep A POCT      Pharyngitis  Ordered Rapid Strep.  Negative.  Throat culture  Prescribed Chloraseptic spray  Recommended increased fluids, increased rest, and frequent handwashing.  May take tylenol for OTC pain relief.  Follow up with PCP.  Strict ER precautions.

## 2020-12-11 LAB — VH CULTURE, THROAT: Culture Result: NORMAL

## 2020-12-19 ENCOUNTER — Ambulatory Visit
Admission: RE | Admit: 2020-12-19 | Discharge: 2020-12-19 | Disposition: A | Discharge status: Home / Self Care | Payer: Medicaid Other | Source: Ambulatory Visit | Attending: Obstetrics and Gynecology | Admitting: Obstetrics and Gynecology

## 2020-12-19 ENCOUNTER — Encounter: Payer: Self-pay | Admitting: Obstetrics and Gynecology

## 2020-12-19 DIAGNOSIS — O98513 Other viral diseases complicating pregnancy, third trimester: Secondary | ICD-10-CM | POA: Insufficient documentation

## 2020-12-19 DIAGNOSIS — R030 Elevated blood-pressure reading, without diagnosis of hypertension: Secondary | ICD-10-CM | POA: Insufficient documentation

## 2020-12-19 DIAGNOSIS — Z8249 Family history of ischemic heart disease and other diseases of the circulatory system: Secondary | ICD-10-CM | POA: Insufficient documentation

## 2020-12-19 DIAGNOSIS — Z3A34 34 weeks gestation of pregnancy: Secondary | ICD-10-CM | POA: Insufficient documentation

## 2020-12-19 DIAGNOSIS — Z0130 Encounter for examination of blood pressure without abnormal findings: Secondary | ICD-10-CM

## 2020-12-19 DIAGNOSIS — O26893 Other specified pregnancy related conditions, third trimester: Secondary | ICD-10-CM | POA: Insufficient documentation

## 2020-12-19 DIAGNOSIS — O34219 Maternal care for unspecified type scar from previous cesarean delivery: Secondary | ICD-10-CM | POA: Insufficient documentation

## 2020-12-19 LAB — CBC AND DIFFERENTIAL
Basophils %: 0.1 % (ref 0.0–3.0)
Basophils Absolute: 0 10*3/uL (ref 0.0–0.3)
Eosinophils %: 3 % (ref 0.0–7.0)
Eosinophils Absolute: 0.3 10*3/uL (ref 0.0–0.8)
Hematocrit: 35.6 % — ABNORMAL LOW (ref 36.0–48.0)
Hemoglobin: 11.6 gm/dL — ABNORMAL LOW (ref 12.0–16.0)
Lymphocytes Absolute: 1.5 10*3/uL (ref 0.6–5.1)
Lymphocytes: 16.6 % (ref 15.0–46.0)
MCH: 25 pg — ABNORMAL LOW (ref 28–35)
MCHC: 33 gm/dL (ref 32–36)
MCV: 76 fL — ABNORMAL LOW (ref 80–100)
MPV: 8.2 fL (ref 6.0–10.0)
Monocytes Absolute: 0.7 10*3/uL (ref 0.1–1.7)
Monocytes: 8.3 % (ref 3.0–15.0)
Neutrophils %: 72 % (ref 42.0–78.0)
Neutrophils Absolute: 6.5 10*3/uL (ref 1.7–8.6)
PLT CT: 309 10*3/uL (ref 130–440)
RBC: 4.67 10*6/uL (ref 3.80–5.00)
RDW: 16.4 % — ABNORMAL HIGH (ref 11.0–14.0)
WBC: 9 10*3/uL (ref 4.0–11.0)

## 2020-12-19 LAB — VH URINE DRUG SCREEN
Propoxyphene: NEGATIVE
Urine Amphetamine: NEGATIVE
Urine Barbiturates: NEGATIVE
Urine Benzodiazepines: NEGATIVE
Urine Buprenorphine: NEGATIVE
Urine Cannabinoids: NEGATIVE
Urine Cocaine: NEGATIVE
Urine Creatinine Random: 55 mg/dL
Urine Ecstasy Screen: NEGATIVE
Urine Fentanyl Screen: NEGATIVE
Urine Methadone Screen: NEGATIVE
Urine Opiates: NEGATIVE
Urine Oxycodone: NEGATIVE
Urine Phencyclidine: NEGATIVE
Urine Specific Gravity: 1.017 (ref 1.001–1.040)
pH, Urine: 6.2 pH (ref 5.0–8.0)

## 2020-12-19 LAB — COMPREHENSIVE METABOLIC PANEL
ALT: 11 U/L (ref 0–55)
AST (SGOT): 16 U/L (ref 10–42)
Albumin/Globulin Ratio: 0.64 Ratio — ABNORMAL LOW (ref 0.80–2.00)
Albumin: 2.5 gm/dL — ABNORMAL LOW (ref 3.5–5.0)
Alkaline Phosphatase: 121 U/L (ref 40–145)
Anion Gap: 11.9 mMol/L (ref 7.0–18.0)
BUN / Creatinine Ratio: 12.1 Ratio (ref 10.0–30.0)
BUN: 7 mg/dL (ref 7–22)
Bilirubin, Total: 0.2 mg/dL (ref 0.1–1.2)
CO2: 21 mMol/L (ref 20–30)
Calcium: 9.1 mg/dL (ref 8.5–10.5)
Chloride: 107 mMol/L (ref 98–110)
Creatinine: 0.58 mg/dL — ABNORMAL LOW (ref 0.60–1.20)
EGFR: 122 mL/min/{1.73_m2} (ref 60–150)
Globulin: 3.9 gm/dL (ref 2.0–4.0)
Glucose: 78 mg/dL (ref 71–99)
Osmolality Calculated: 269 mOsm/kg — ABNORMAL LOW (ref 275–300)
Potassium: 3.9 mMol/L (ref 3.5–5.3)
Protein, Total: 6.4 gm/dL (ref 6.0–8.3)
Sodium: 136 mMol/L (ref 136–147)

## 2020-12-19 LAB — PROTEIN / CREATININE RATIO, URINE
Protein/Creatinine Ratio: 0.13 Ratio — ABNORMAL HIGH (ref 0.00–0.09)
Urine Creatinine Random: 55 mg/dL
Urine Protein: 7 mg/dL (ref 0–14)

## 2020-12-19 LAB — URIC ACID: Uric acid: 4.9 mg/dL (ref 2.6–6.0)

## 2020-12-19 NOTE — Discharge Instr - AVS First Page (Signed)
Call your OB provider, or return to the hospital, if you have any of the following signs:  -Contractions: more then 6 in an hour.  -Sharp, severe constant pain in the abdomen  -Persistent nausea, vomiting, diarrhea; accompanied by the inability to keep liquids down  -Leaking of amniotic fluid, or water breaking; this can be a gush or a small trickle  -Vaginal bleeding especially any bright red bleeding like a period or passing clots (it is normal to have spotting after vaginal exam or intercourse)  -Concern about a decrease in your baby's movements.  -Temperature greater than 100.4 degrees farenheit orally    -Severe headache which is not relieved 60 minutes after taking tylenol. Blurry vision or spots before your eyes; not just when you suddenly stand up; severe heartburn or pain on the upper right side of your abdomen that is not relieved by an antacid, sudden increase in swelling in your face, hands, or feet.

## 2020-12-19 NOTE — H&P (Signed)
OBSTETRICS triage note/ [facility]  12/19/2020 1:26 PM    Chief Complaint   Patient presents with   . Hypertension     in office. continuation of care       Kristi Branch is a 30 y.o. G3P2002 at [redacted]w[redacted]d weeks gestation with Estimated Date of Delivery: 01/24/21 who presents to labor and delivery c/o BP in office elevated today.  . Patient denies symptoms typically associated with pre-eclampsia; denies headache, right upper quadrant abdominal pain, visual changes and edema. Patient reports The patient reports that she has had benign prenatal care with [facility] although she did have HTN in first pregnancy near the end of her pregnancy. Available prenatal care records reviewed by myself.    OB History   Gravida Para Term Preterm AB Living   3 2 2  0 0 2   SAB IAB Ectopic Multiple Live Births   0 0 0 0 2      # Outcome Date GA Lbr Len/2nd Weight Sex Delivery Anes PTL Lv   3 Current            2 Term 05/07/19 [redacted]w[redacted]d  3.771 kg (8 lb 5 oz) F CSECT Spinal N LIV   1 Term 08/07/15 [redacted]w[redacted]d  4.085 kg (9 lb 0.1 oz) M CSECT EPI, Spinal N LIV     Past Gyn History: None    Past Medical History:  Past Medical History:   Diagnosis Date   . Anemia    . 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 Treated in ED.   Marland Kitchen Hypertension     chronic    . Obesity        Past Surgical History:  Past Surgical History:   Procedure Laterality Date   . CESAREAN SECTION N/A 08/07/2015    Procedure: CESAREAN SECTION;  Surgeon: Levander Campion, MD;  Location: [facility];  Service: Obstetrics;  Laterality: N/A;   . CESAREAN SECTION, REPEAT Bilateral 05/07/2019    Procedure: CESAREAN SECTION, REPEAT;  Surgeon: Truddie Crumble, MD;  Location: [facility];  Service: Obstetrics;  Laterality: Bilateral;     Social History:  reports that she has never smoked. She has never used smokeless tobacco. She reports that she does not drink alcohol and does not use drugs.    Family History:  Family History   Problem Relation Age  of Onset   . COPD Mother    . Asthma Mother    . Hypertension Paternal Grandmother    . No known problems Father        Medications Prior to Admission   Medication Sig Dispense Refill Last Dose   . ferrous sulfate 325 (65 FE) MG tablet Take 325 mg by mouth every morning with breakfast      . phenol (CHLORASEPTIC) 1.4 % Liquid Take 1 spray by mouth every 2 (two) hours as needed (sore throat) 20 mL 0    . Prenatal MV-Min-Fe Fum-FA-DHA (PRENATAL 1 PO) Take by mouth        Allergies: No Known Allergies    Review of Systems:  A comprehensive review of systems was obtained from the patient:  Negative    Physical Exam:  Heart Rate:  [81-102] 88  BP: (129-183)/(70-94) 183/94  FHR: 135, Reactive NST; reassuring fetal surveillance.  UCs: none  Cx: (Per not indicated at present    General:appears comfortable. No apparent distress  Abd: gravid, NT with appropriate FH. Marland Kitchen  Ext: no  cyanosis or erythema, moderate edema of lower extremities with symmetric calves and no tenderness. 2+ DTRs.      Results     Procedure Component Value Units Date/Time    Urine Drug Screen [[id number]] Collected: 12/19/20 1140    Specimen: Urine, Random Updated: 12/19/20 1324     Urine Creatinine Random 55 mg/dL      pH, Urine 6.2 pH      Urine Specific Gravity 1.017     Urine Amphetamine Negative     Urine Barbiturates Negative     Urine Benzodiazepines Negative     Urine Cannabinoids Negative     Urine Cocaine Negative     Urine Methadone Screen Negative     Urine Opiates Negative     Urine Oxycodone Negative     Urine Phencyclidine Negative     Propoxyphene Negative     Urine Buprenorphine Negative     Urine Ecstasy Screen Negative     Urine Fentanyl Screen Negative    Protein / creatinine ratio, urine [[id number]]  (Abnormal) Collected: 12/19/20 1140    Specimen: Urine, Random Updated: 12/19/20 1218     Urine Protein 7 mg/dL      Urine Creatinine Random 55 mg/dL      Protein/Creatinine Ratio 0.13 Ratio     CBC and differential [[id number]]  (Abnormal)  Collected: 12/19/20 1055    Specimen: Blood Updated: 12/19/20 1130     WBC 9.0 K/cmm      RBC 4.67 M/cmm      Hemoglobin 11.6 gm/dL      Hematocrit 08.6 %      MCV 76 fL      MCH 25 pg      MCHC 33 gm/dL      RDW 57.8 %      PLT CT 309 K/cmm      MPV 8.2 fL      Neutrophils % 72.0 %      Lymphocytes 16.6 %      Monocytes 8.3 %      Eosinophils % 3.0 %      Basophils % 0.1 %      Neutrophils Absolute 6.5 K/cmm      Lymphocytes Absolute 1.5 K/cmm      Monocytes Absolute 0.7 K/cmm      Eosinophils Absolute 0.3 K/cmm      Basophils Absolute 0.0 K/cmm     Comprehensive metabolic panel [[id number]]  (Abnormal) Collected: 12/19/20 1055    Specimen: Plasma Updated: 12/19/20 1130     Sodium 136 mMol/L      Potassium 3.9 mMol/L      Chloride 107 mMol/L      CO2 21 mMol/L      Calcium 9.1 mg/dL      Glucose 78 mg/dL      Creatinine 4.13 mg/dL      BUN 7 mg/dL      Protein, Total 6.4 gm/dL      Albumin 2.5 gm/dL      Alkaline Phosphatase 121 U/L      ALT 11 U/L      AST (SGOT) 16 U/L      Bilirubin, Total 0.2 mg/dL      Albumin/Globulin Ratio 0.64 Ratio      Anion Gap 11.9 mMol/L      BUN / Creatinine Ratio 12.1 Ratio      EGFR 122 mL/min/1.25m2      Osmolality Calculated 269 mOsm/kg      Globulin 3.9 gm/dL  Uric acid [[id number]] Collected: 12/19/20 1055    Specimen: Plasma Updated: 12/19/20 1130     Uric acid 4.9 mg/dL     Multi-Specimen STD DNA Probe [[id number]] Resulted: 07/05/20     Updated: 12/19/20 1051     Chlamydia trachomatis neg     Neisseria gonorrhoeae neg    Rubella Screen, Serum [[id number]] Resulted: 07/05/20     Updated: 12/19/20 1051     Rubella IgG immune    Hepatitis B (HBV) Surface Antigen w/ Reflex to Confirmation [[id number]] Resulted: 07/05/20    Specimen: Blood Updated: 12/19/20 1051     Hepatitis B Surface Antigen Negative    RPR [[id number]] Resulted: 07/05/20    Specimen: Blood Updated: 12/19/20 1051     RPR Nonreactive    HIV Ag/Ab 4th generation [[id number]] Resulted: 07/05/20     Updated: 12/19/20  1051     HIV Ag/Ab, 4th Generation neg        Radiology Results (24 Hour)     ** No results found for the last 24 hours. **        Assessment/Plan:  1. IUP at [redacted]w[redacted]d gestation.  2. No evidence of acute maternal or fetal compromise.  3. Reactive NST.  4. Most BP here have been normal although a few in mild range for elevation..  Labs nl including a Prot/ Creatinine ratio.  So by definition no preeclampsia at this time although may have Gestational HTN  .  Plan for now is followup in office early next week with NST and possible repeat labs.  S/S of preeclampsia reviewed at length and pt urged to return to hospital if they occur    Mauricia Area, MD

## 2020-12-20 ENCOUNTER — Inpatient Hospital Stay: Payer: Medicaid Other | Admitting: Anesthesiology

## 2020-12-20 ENCOUNTER — Inpatient Hospital Stay
Admission: EM | Admit: 2020-12-20 | Discharge: 2020-12-23 | DRG: 540 | Disposition: A | Discharge status: Home / Self Care | Payer: Medicaid Other | Attending: Obstetrics and Gynecology | Admitting: Obstetrics and Gynecology

## 2020-12-20 ENCOUNTER — Encounter: Payer: Self-pay | Admitting: Obstetrics & Gynecology

## 2020-12-20 ENCOUNTER — Encounter
Admission: EM | Disposition: A | Discharge status: Home / Self Care | Payer: Self-pay | Source: Home / Self Care | Attending: Obstetrics & Gynecology

## 2020-12-20 DIAGNOSIS — O34211 Maternal care for low transverse scar from previous cesarean delivery: Secondary | ICD-10-CM | POA: Diagnosis present

## 2020-12-20 DIAGNOSIS — Z825 Family history of asthma and other chronic lower respiratory diseases: Secondary | ICD-10-CM

## 2020-12-20 DIAGNOSIS — O114 Pre-existing hypertension with pre-eclampsia, complicating childbirth: Principal | ICD-10-CM | POA: Diagnosis present

## 2020-12-20 DIAGNOSIS — Z8249 Family history of ischemic heart disease and other diseases of the circulatory system: Secondary | ICD-10-CM

## 2020-12-20 DIAGNOSIS — Z3A35 35 weeks gestation of pregnancy: Secondary | ICD-10-CM

## 2020-12-20 DIAGNOSIS — K660 Peritoneal adhesions (postprocedural) (postinfection): Secondary | ICD-10-CM | POA: Diagnosis present

## 2020-12-20 DIAGNOSIS — Z3490 Encounter for supervision of normal pregnancy, unspecified, unspecified trimester: Secondary | ICD-10-CM

## 2020-12-20 DIAGNOSIS — Z8616 Personal history of COVID-19: Secondary | ICD-10-CM

## 2020-12-20 DIAGNOSIS — O99214 Obesity complicating childbirth: Secondary | ICD-10-CM | POA: Diagnosis present

## 2020-12-20 DIAGNOSIS — O99613 Diseases of the digestive system complicating pregnancy, third trimester: Secondary | ICD-10-CM | POA: Diagnosis present

## 2020-12-20 DIAGNOSIS — O1490 Unspecified pre-eclampsia, unspecified trimester: Secondary | ICD-10-CM

## 2020-12-20 LAB — URIC ACID: Uric acid: 4.9 mg/dL (ref 2.6–6.0)

## 2020-12-20 LAB — COMPREHENSIVE METABOLIC PANEL
ALT: 12 U/L (ref 0–55)
AST (SGOT): 24 U/L (ref 10–42)
Albumin/Globulin Ratio: 0.6 Ratio — ABNORMAL LOW (ref 0.80–2.00)
Albumin: 2.6 gm/dL — ABNORMAL LOW (ref 3.5–5.0)
Alkaline Phosphatase: 134 U/L (ref 40–145)
Anion Gap: 16.3 mMol/L (ref 7.0–18.0)
BUN / Creatinine Ratio: 13.1 Ratio (ref 10.0–30.0)
BUN: 8 mg/dL (ref 7–22)
Bilirubin, Total: 0.3 mg/dL (ref 0.1–1.2)
CO2: 17 mMol/L — ABNORMAL LOW (ref 20–30)
Calcium: 10 mg/dL (ref 8.5–10.5)
Chloride: 106 mMol/L (ref 98–110)
Creatinine: 0.61 mg/dL (ref 0.60–1.20)
EGFR: 120 mL/min/{1.73_m2} (ref 60–150)
Globulin: 4.3 gm/dL — ABNORMAL HIGH (ref 2.0–4.0)
Glucose: 83 mg/dL (ref 71–99)
Osmolality Calculated: 268 mOsm/kg — ABNORMAL LOW (ref 275–300)
Potassium: 4.3 mMol/L (ref 3.5–5.3)
Protein, Total: 6.9 gm/dL (ref 6.0–8.3)
Sodium: 135 mMol/L — ABNORMAL LOW (ref 136–147)

## 2020-12-20 LAB — VH URINE DRUG SCREEN
Propoxyphene: NEGATIVE
Urine Amphetamine: NEGATIVE
Urine Barbiturates: NEGATIVE
Urine Benzodiazepines: NEGATIVE
Urine Buprenorphine: NEGATIVE
Urine Cannabinoids: NEGATIVE
Urine Cocaine: NEGATIVE
Urine Creatinine Random: 85 mg/dL
Urine Ecstasy Screen: NEGATIVE
Urine Fentanyl Screen: NEGATIVE
Urine Methadone Screen: NEGATIVE
Urine Opiates: NEGATIVE
Urine Oxycodone: NEGATIVE
Urine Phencyclidine: NEGATIVE
Urine Specific Gravity: 1.027 (ref 1.001–1.040)
pH, Urine: 6 pH (ref 5.0–8.0)

## 2020-12-20 LAB — PROTEIN / CREATININE RATIO, URINE
Protein/Creatinine Ratio: 0.12 Ratio — ABNORMAL HIGH (ref 0.00–0.09)
Urine Creatinine Random: 85 mg/dL
Urine Protein: 10 mg/dL (ref 0–14)

## 2020-12-20 LAB — TYPE AND SCREEN
AB Screen: NEGATIVE
ABO Rh: O POS

## 2020-12-20 LAB — CBC
Hematocrit: 36.5 % (ref 36.0–48.0)
Hemoglobin: 12 gm/dL (ref 12.0–16.0)
MCH: 25 pg — ABNORMAL LOW (ref 28–35)
MCHC: 33 gm/dL (ref 32–36)
MCV: 76 fL — ABNORMAL LOW (ref 80–100)
MPV: 8 fL (ref 6.0–10.0)
PLT CT: 334 10*3/uL (ref 130–440)
RBC: 4.81 10*6/uL (ref 3.80–5.00)
RDW: 16.5 % — ABNORMAL HIGH (ref 11.0–14.0)
WBC: 9.9 10*3/uL (ref 4.0–11.0)

## 2020-12-20 SURGERY — Surgical Case
Anesthesia: Regional | Site: Abdomen | Laterality: Bilateral | Wound class: Clean Contaminated

## 2020-12-20 MED ORDER — MORPHINE SULFATE (PF) 1 MG/ML IJ SOLN
INTRAMUSCULAR | Status: DC | PRN
Start: 2020-12-20 — End: 2020-12-20
  Administered 2020-12-20: 200 ug via INTRATHECAL

## 2020-12-20 MED ORDER — LACTATED RINGERS IV SOLN
INTRAVENOUS | Status: DC
Start: 2020-12-20 — End: 2020-12-21

## 2020-12-20 MED ORDER — ONDANSETRON HCL 4 MG/2ML IJ SOLN
INTRAMUSCULAR | Status: DC | PRN
Start: 2020-12-20 — End: 2020-12-20
  Administered 2020-12-20: 6 mg via INTRAVENOUS

## 2020-12-20 MED ORDER — MORPHINE SULFATE (PF) 0.5 MG/ML IJ SOLN
INTRAMUSCULAR | Status: AC
Start: 2020-12-20 — End: ?
  Filled 2020-12-20: qty 10

## 2020-12-20 MED ORDER — MORPHINE SULFATE (PF) 1 MG/ML IJ SOLN
INTRAMUSCULAR | Status: DC | PRN
Start: 2020-12-20 — End: 2020-12-20

## 2020-12-20 MED ORDER — ONDANSETRON HCL 4 MG/2ML IJ SOLN
INTRAMUSCULAR | Status: DC | PRN
Start: 2020-12-20 — End: 2020-12-20

## 2020-12-20 MED ORDER — VH CEFAZOLIN 3 G IN NS 100 ML IVPB (SIMPLE)
3.0000 g | Freq: Once | INTRAVENOUS | Status: AC
Start: 2020-12-20 — End: 2020-12-21

## 2020-12-20 MED ORDER — HYDRALAZINE HCL 20 MG/ML IJ SOLN
10.0000 mg | INTRAMUSCULAR | Status: DC | PRN
Start: 2020-12-20 — End: 2020-12-21
  Administered 2020-12-20 (×3): 10 mg via INTRAVENOUS
  Filled 2020-12-20 (×2): qty 1

## 2020-12-20 MED ORDER — BUPIVACAINE HCL (PF) 0.75 % IJ SOLN
INTRAMUSCULAR | Status: DC | PRN
Start: 2020-12-20 — End: 2020-12-20
  Administered 2020-12-20: 2 mL via INTRASPINAL

## 2020-12-20 MED ORDER — OXYTOCIN 10 UNIT/ML IJ SOLN
INTRAMUSCULAR | Status: DC | PRN
Start: 2020-12-20 — End: 2020-12-20
  Administered 2020-12-20: 10 [IU] via INTRAVENOUS
  Administered 2020-12-20 (×2): 20 [IU] via INTRAVENOUS

## 2020-12-20 MED ORDER — VH PHENYLEPHRINE 120 MCG/ML IV BOLUS (ANESTHESIA)
PREFILLED_SYRINGE | INTRAVENOUS | Status: DC | PRN
Start: 2020-12-20 — End: 2020-12-20
  Administered 2020-12-20 (×3): 120 ug via INTRAVENOUS
  Administered 2020-12-20 (×2): 240 ug via INTRAVENOUS
  Administered 2020-12-20 (×2): 120 ug via INTRAVENOUS
  Administered 2020-12-20: 240 ug via INTRAVENOUS
  Administered 2020-12-20 (×2): 120 ug via INTRAVENOUS
  Administered 2020-12-20: 240 ug via INTRAVENOUS

## 2020-12-20 MED ORDER — KETOROLAC TROMETHAMINE 30 MG/ML IJ SOLN
INTRAMUSCULAR | Status: DC | PRN
Start: 2020-12-20 — End: 2020-12-20
  Administered 2020-12-20: 30 mg via INTRAVENOUS

## 2020-12-20 MED ORDER — LACTATED RINGERS IV BOLUS
1000.0000 mL | Freq: Once | INTRAVENOUS | Status: DC
Start: 2020-12-20 — End: 2020-12-21

## 2020-12-20 MED ORDER — LACTATED RINGERS IV SOLN
INTRAVENOUS | Status: DC | PRN
Start: 2020-12-20 — End: 2020-12-20

## 2020-12-20 MED ORDER — VH CEFAZOLIN 3 G IN NS 100 ML IVPB (SIMPLE)
INTRAVENOUS | Status: AC
Start: 2020-12-20 — End: 2020-12-20
  Administered 2020-12-20: 23:00:00 3 g via INTRAVENOUS
  Filled 2020-12-20: qty 100

## 2020-12-20 MED ORDER — BUPIVACAINE HCL (PF) 0.75 % IJ SOLN
INTRAMUSCULAR | Status: DC | PRN
Start: 2020-12-20 — End: 2020-12-20

## 2020-12-20 MED ORDER — SODIUM CHLORIDE (PF) 0.9 % IJ SOLN
3.0000 mL | Freq: Two times a day (BID) | INTRAMUSCULAR | Status: DC
Start: 2020-12-20 — End: 2020-12-21

## 2020-12-20 SURGICAL SUPPLY — 14 items
BANDAID EXTRA LARGE (Dressings) ×2 IMPLANT
BLADE CLIPPER (Supply) ×2 IMPLANT
GLOVE BIOGEL UT M PI SUR SZ6.5 (Supply) ×2 IMPLANT
PACK C-SECTION (Supply) ×2 IMPLANT
SLEEVE SCD KNEE #5329 (Supply) ×2 IMPLANT
SOL SALINE IRRIG 500ML (Solutions) ×4 IMPLANT
STAPLER SKIN DISP35WIDE#PXW35 (Supply) ×2 IMPLANT
SUT CHROMIC GUT 1 915H (Supply) ×4 IMPLANT
SUT VICRYL 1 J359H (Supply) ×2 IMPLANT
SUT VICRYL 2-0 J357H (Supply) ×2 IMPLANT
SUT VICRYL 3-0 J516H (Supply) ×2 IMPLANT
SYS TRAXI PANN RETRACTION 1030 (Supply) ×2 IMPLANT
TRAY URINEMETER FOLEY LATEXFRE (TDC (Tubes, Draines, Catheters)) ×1
TRAY URINEMETER FOLEY LATEXFRE (TDC (Tubes, Drains, Catheters)) ×1 IMPLANT

## 2020-12-20 NOTE — Addendum Note (Signed)
Addendum  created 12/20/20 2352 by Erven Colla, MD    Intraprocedure Meds edited

## 2020-12-20 NOTE — H&P (Signed)
LABOR AND DELIVERY ADMISSION H&P    12/20/2020 10:09 PM    Kristi Branch is a 30 y.o.G3P2002 with an Estimated Date of Delivery: 01/24/21 at [redacted]w[redacted]d gestation who is being admitted for severe preeclampsia with severe range BPs. Patient notes swelling and headache. Patient denies loss of fluid, vaginal bleeding and decreased fetal movement. Patient reports prenatal care at [facility]. Prenatal care records The portion of the patient's prenatal care records that are currently available have been reviewed by myself.    Past Medical History:  Past Medical History:   Diagnosis Date   . Anemia    . 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 Treated in ED.   Marland Kitchen Hypertension     chronic    . Obesity          Past Surgical History:  Past Surgical History:   Procedure Laterality Date   . CESAREAN SECTION N/A 08/07/2015    Procedure: CESAREAN SECTION;  Surgeon: Levander Campion, MD;  Location: [facility];  Service: Obstetrics;  Laterality: N/A;   . CESAREAN SECTION, REPEAT Bilateral 05/07/2019    Procedure: CESAREAN SECTION, REPEAT;  Surgeon: Truddie Crumble, MD;  Location: [facility];  Service: Obstetrics;  Laterality: Bilateral;       Family History:  Family History   Problem Relation Age of Onset   . COPD Mother    . Asthma Mother    . Hypertension Paternal Grandmother    . No known problems Father        Social History:  reports that she has never smoked. She has never used smokeless tobacco. She reports that she does not drink alcohol and does not use drugs.    Past Gynecologic History: None.    OB History   Gravida Para Term Preterm AB Living   3 2 2  0 0 2   SAB IAB Ectopic Multiple Live Births   0 0 0 0 2      # Outcome Date GA Lbr Len/2nd Weight Sex Delivery Anes PTL Lv   3 Current            2 Term 05/07/19 [redacted]w[redacted]d  3.771 kg (8 lb 5 oz) F CSECT Spinal N LIV      Name: CHERRELLE, PLANTE      Apgar1: 8  Apgar5: 9   1 Term 08/07/15 [redacted]w[redacted]d  4.085 kg (9 lb 0.1 oz) M CSECT EPI, Spinal N LIV       Name: Savard,BOY Reylene      Apgar1: 8  Apgar5: 9      Obstetric Comments   G1 - high BP; failed IOL   G2 - doesn't think HTN     Medications Prior to Admission   Medication Sig Dispense Refill Last Dose   . ferrous sulfate 325 (65 FE) MG tablet Take 325 mg by mouth every morning with breakfast   12/20/2020 at Unknown time   . Prenatal MV-Min-Fe Fum-FA-DHA (PRENATAL 1 PO) Take by mouth   12/20/2020 at Unknown time       Allergies: No Known Allergies    Physical Exam:  Heart Rate:  [80-103] 94  BP: (185-196)/(84-92) 185/91    FHTs: 140, reassuring fetal surveillance.  UCs: none on external tocometer, none perceived by patient.  Cx: (Per not indicated.    General: Patient w/o complaints and no UCs  Abdomen: Gravid, NT with appropriate FH.   Extremities: No cyanosis or erythema. Mild bilateral LE edema.  Symmetric. No calf pain. 2+ DTRs  Back: No CVAT, atraumatic, no point tenderness.  Vulva/Vagina: No lesions, nl. female anatomy, nl. urethral meatus.    Review of Systems:  A comprehensive review of systems was:  Negative     Prenatal Care Labs:  Lab Results   Component Value Date    ABORH O Positive 05/07/2019    HEPBSAG Negative 07/05/2020    GBS Negative 04/15/2019    RPR Nonreactive 07/05/2020    RUBELLAABIGG immune 07/05/2020     Recent Labs:   Recent Labs   Lab 12/20/20  2113   WBC 9.9   Hemoglobin 12.0   PLT CT 334     Results     Procedure Component Value Units Date/Time    Type and Screen [[id number]] Collected: 12/20/20 2114    Specimen: Blood Updated: 12/20/20 2207    Urine Drug Screen [[id number]] Collected: 12/20/20 2100    Specimen: Urine, Random Updated: 12/20/20 2200     Urine Creatinine Random 85 mg/dL      pH, Urine 6.0 pH      Urine Specific Gravity 1.027     Urine Amphetamine Negative     Urine Barbiturates Negative     Urine Benzodiazepines Negative     Urine Cannabinoids Negative     Urine Cocaine Negative     Urine Methadone Screen Negative     Urine Opiates Negative     Urine Oxycodone Negative      Urine Phencyclidine Negative     Propoxyphene Negative     Urine Buprenorphine Negative     Urine Ecstasy Screen Negative     Urine Fentanyl Screen Negative    Protein / creatinine ratio, urine [[id number]]  (Abnormal) Collected: 12/20/20 2100    Specimen: Urine, Random Updated: 12/20/20 2154     Urine Protein 10 mg/dL      Urine Creatinine Random 85 mg/dL      Protein/Creatinine Ratio 0.12 Ratio     Comprehensive metabolic panel [[id number]]  (Abnormal) Collected: 12/20/20 2113    Specimen: Plasma Updated: 12/20/20 2145     Sodium 135 mMol/L      Potassium 4.3 mMol/L      Chloride 106 mMol/L      CO2 17 mMol/L      Calcium 10.0 mg/dL      Glucose 83 mg/dL      Creatinine 6.29 mg/dL      BUN 8 mg/dL      Protein, Total 6.9 gm/dL      Albumin 2.6 gm/dL      Alkaline Phosphatase 134 U/L      ALT 12 U/L      AST (SGOT) 24 U/L      Bilirubin, Total 0.3 mg/dL      Albumin/Globulin Ratio 0.60 Ratio      Anion Gap 16.3 mMol/L      BUN / Creatinine Ratio 13.1 Ratio      EGFR 120 mL/min/1.82m2      Osmolality Calculated 268 mOsm/kg      Globulin 4.3 gm/dL     Uric acid [[id number]] Collected: 12/20/20 2113    Specimen: Plasma Updated: 12/20/20 2145     Uric acid 4.9 mg/dL     CBC without differential [[id number]]  (Abnormal) Collected: 12/20/20 2113    Specimen: Blood Updated: 12/20/20 2128     WBC 9.9 K/cmm      RBC 4.81 M/cmm      Hemoglobin 12.0 gm/dL  Hematocrit 36.5 %      MCV 76 fL      MCH 25 pg      MCHC 33 gm/dL      RDW 16.1 %      PLT CT 334 K/cmm      MPV 8.0 fL         Radiology Results (24 Hour)     ** No results found for the last 24 hours. **        @radrsltall @    Assessment and Plan:  1. Pre-term IUP in 30 y.o.G3P2002 at [redacted]w[redacted]d gestation with dating confirmed by myself taking into consideration the available data.  2. Severe preeclampsia by severe range BPs, labs ok, given hydralazine several doses in triage.  3. Recommend delivery tonight by repeat cesarean. Plan magnesium and BP control  4. Comfortable  with no UCs and in no need of analgesia at this time.  5. GBS unknown.  6. Discussed preeclampsia and management with pt.  7. Consented for cesarean delivery.      Betsy Pries, MD

## 2020-12-20 NOTE — Anesthesia Postprocedure Evaluation (Signed)
Anesthesia Post Evaluation    Patient: Kristi Branch    Procedure(s):  CESAREAN SECTION, REPEAT    Anesthesia type: spinal    Anesthesia Post Evaluation:     Patient Evaluated: PACU  Patient Participation: complete - patient participated  Level of Consciousness: awake    Pain Management: adequate    Airway Patency: patent    Anesthetic complications: No      PONV Status: none    Cardiovascular status: acceptable  Respiratory status: acceptable  Hydration status: acceptable        Signed by: Erven Colla, MD, 12/20/2020 11:49 PM

## 2020-12-20 NOTE — OB ED Provider Note (Signed)
OBSTETRICAL EMERGENCY DEPARTMENT   NOTE    Date Time: 12/20/20 9:17 PM  Patient Name: [name]      Chief Complaint   Patient presents with    Hypertension    Edema         History of Present Illness:     Kristi Branch is a 30 y.o. G38P2002 female with a gestational age of [redacted]w[redacted]d and Estimated Date of Delivery: 01/24/21 who presents to the hospital for: Feet swelling, headache,"can't breathe."    Kristi Branch presents with several complaints.  She complains that her feet are swollen and painful - mostly on the left side.  She complains of headaches for the past 3-4 days.  Pain is located at her forehead.  She has had headaches before but they usually go away.  She rates her pain 5-6/10.  She took Tylenol 2x today without relief.  Last time was 3 pm.  She denies visual disturbances or light sensitivity.  She also complains of feeling hot but not like a fever.  She also complains that she can't breathe.  Has felt like this for a few weeks.  Feels it the majority of the time.  Can hardly sleep because of it.  Does not seem to be related to activity.  Seems to be worse at night but patient not able to say if this is because she lies down and it's worse.  She has no chest pain or palpitations.  She wonders if this is because of heartburn.  She has no cough.  She denies abd pain, ctxs, vag bleeding, or leaking fluid.  She reports normal fetal movement.  She is suspected of having CHTN but is not on any meds for this.      Her prenatal care is with [facility].  Prenatal records reviewed.  Has had several mild range BP elevations.  Pregnancy notable for:  1. COVID 11/05/20  2. Possible CHTN - not on meds  3. Obesity - BMI 50  4. Prior C/S x 2      Review of Systems:     Review of Systems   Constitutional: Negative for fever.   Eyes: Negative for photophobia and visual disturbance.   Respiratory: Positive for shortness of breath. Negative for cough.    Cardiovascular: Positive for leg  swelling. Negative for chest pain and palpitations.   Gastrointestinal: Negative for abdominal pain, nausea and vomiting.   Endocrine: Positive for heat intolerance.   Genitourinary: Negative for vaginal bleeding and vaginal discharge.   Neurological: Positive for headaches. Negative for dizziness and light-headedness.        Medications:     Medications Prior to Admission   Medication Sig Dispense Refill Last Dose    ferrous sulfate 325 (65 FE) MG tablet Take 325 mg by mouth every morning with breakfast   12/20/2020 at Unknown time    Prenatal MV-Min-Fe Fum-FA-DHA (PRENATAL 1 PO) Take by mouth   12/20/2020 at Unknown time         No Known Allergies      OB History   Gravida Para Term Preterm AB Living   3 2 2  0 0 2   SAB IAB Ectopic Multiple Live Births   0 0 0 0 2      # Outcome Date GA Lbr Len/2nd Weight Sex Delivery Anes PTL Lv   3 Current            2 Term 05/07/19 [redacted]w[redacted]d  3.771 kg (8  lb 5 oz) F CSECT Spinal N LIV   1 Term 08/07/15 [redacted]w[redacted]d  4.085 kg (9 lb 0.1 oz) M CSECT EPI, Spinal N LIV      Obstetric Comments   G1 - high BP; failed IOL   G2 - doesn't think HTN       Past Medical History:   Diagnosis Date    Anemia     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 Treated in ED.    Hypertension     chronic     Obesity          Past Surgical History:   Procedure Laterality Date    CESAREAN SECTION N/A 08/07/2015    Procedure: CESAREAN SECTION;  Surgeon: Levander Campion, MD;  Location: [facility];  Service: Obstetrics;  Laterality: N/A;    CESAREAN SECTION, REPEAT Bilateral 05/07/2019    Procedure: CESAREAN SECTION, REPEAT;  Surgeon: Truddie Crumble, MD;  Location: [facility];  Service: Obstetrics;  Laterality: Bilateral;       She  reports that she has never smoked. She has never used smokeless tobacco. She reports that she does not drink alcohol and does not use drugs.        Physical Exam:     Vitals:    12/20/20 2102   BP: (!) 193/92   Pulse: (!) 103    Body mass index is  53.32 kg/m.     Pulse ox 99-100% room air    General: Alert and oriented, no acute distress  HEENT: face symmetric  Cardiovascular: Normal rate, regular rhythm, no murmurs  Respiratory: Clear to auscultation bilaterally, no distress   Abdomen: soft, gravid, nontender, nondistended, no masses or organomegaly; obese  Musculoskeletal: trace pedal edema, good ROM, no calf tenderness  Neurologic/Psychiatric: grossly intact, normal mood, behavior, speech, and thought processes; unable to elicit patellar DTRs  Integumentary: normal coloration and turgor, no rashes, no suspicious skin lesions noted  Genitourinary:      Pelvic: deferred    NST:   NST reviewed:  Fetal Heart: baseline 125, moderate variability, accels present, no decels  TOCO: irreg ctxs    Labs:     Results     Procedure Component Value Units Date/Time    Type and Screen [[id number]] Collected: 12/20/20 2114    Specimen: Blood Updated: 12/20/20 2210     ABO Rh O Positive     AB Screen NEGATIVE    Urine Drug Screen [[id number]] Collected: 12/20/20 2100    Specimen: Urine, Random Updated: 12/20/20 2200     Urine Creatinine Random 85 mg/dL      pH, Urine 6.0 pH      Urine Specific Gravity 1.027     Urine Amphetamine Negative     Urine Barbiturates Negative     Urine Benzodiazepines Negative     Urine Cannabinoids Negative     Urine Cocaine Negative     Urine Methadone Screen Negative     Urine Opiates Negative     Urine Oxycodone Negative     Urine Phencyclidine Negative     Propoxyphene Negative     Urine Buprenorphine Negative     Urine Ecstasy Screen Negative     Urine Fentanyl Screen Negative    Protein / creatinine ratio, urine [[id number]]  (Abnormal) Collected: 12/20/20 2100    Specimen: Urine, Random Updated: 12/20/20 2154     Urine Protein 10 mg/dL      Urine Creatinine Random  85 mg/dL      Protein/Creatinine Ratio 0.12 Ratio     Comprehensive metabolic panel [[id number]]  (Abnormal) Collected: 12/20/20 2113    Specimen: Plasma Updated: 12/20/20 2145      Sodium 135 mMol/L      Potassium 4.3 mMol/L      Chloride 106 mMol/L      CO2 17 mMol/L      Calcium 10.0 mg/dL      Glucose 83 mg/dL      Creatinine 6.96 mg/dL      BUN 8 mg/dL      Protein, Total 6.9 gm/dL      Albumin 2.6 gm/dL      Alkaline Phosphatase 134 U/L      ALT 12 U/L      AST (SGOT) 24 U/L      Bilirubin, Total 0.3 mg/dL      Albumin/Globulin Ratio 0.60 Ratio      Anion Gap 16.3 mMol/L      BUN / Creatinine Ratio 13.1 Ratio      EGFR 120 mL/min/1.48m2      Osmolality Calculated 268 mOsm/kg      Globulin 4.3 gm/dL     Uric acid [[id number]] Collected: 12/20/20 2113    Specimen: Plasma Updated: 12/20/20 2145     Uric acid 4.9 mg/dL     CBC without differential [[id number]]  (Abnormal) Collected: 12/20/20 2113    Specimen: Blood Updated: 12/20/20 2128     WBC 9.9 K/cmm      RBC 4.81 M/cmm      Hemoglobin 12.0 gm/dL      Hematocrit 36.6 %      MCV 76 fL      MCH 25 pg      MCHC 33 gm/dL      RDW 44.0 %      PLT CT 334 K/cmm      MPV 8.0 fL             Assessment:   30 y.o. H4V4259 @ [redacted]w[redacted]d with severe range HTN and suspected superimposed preeclampsia on chronic HTN with severe features  PIH labs normal and no significant proteinuria  FHT category I    Patient treated with hydralazine 10 mg IV without improvement after 20 min.  Second dose hydralazine 10 mg IV given per protocol.    Plan:     Primary on-call OB, Dr. Darcel Bayley notified.  He will assume care of the patient and admit her for delivery.          Signed by: Coralie Carpen, MD

## 2020-12-20 NOTE — Progress Notes (Signed)
NST Note    Indication: HTN  Time on monitor: 35 min  Baseline: 125  Variability: moderate  Decels: none  Accels: present  Interpretation: Reactive/Category 1  Toco: irreg ctxs

## 2020-12-20 NOTE — Anesthesia Preprocedure Evaluation (Addendum)
Anesthesia Evaluation    AIRWAY    Mallampati: II    TM distance: >3 FB  Neck ROM: full  Mouth Opening:full   CARDIOVASCULAR    cardiovascular exam normal       DENTAL    no notable dental hx     PULMONARY    pulmonary exam normal     OTHER FINDINGS                  Relevant Problems   NEURO/PSYCH   (+) History of postpartum hemorrhage               Anesthesia Plan    ASA 3     spinal                                 informed consent obtained      pertinent labs reviewed             Signed by: Erven Colla, MD 12/20/20 10:36 PM

## 2020-12-20 NOTE — Op Note (Signed)
Cesarean Section Procedure Note    Pre-operative Diagnosis: [redacted]w[redacted]d, Prior Cesarean x 2; Severe Preeclampsia  Post-operative Diagnosis: same, Live Preterm Female Infant Delivered    PROCEDURE : Procedure(s):  CESAREAN SECTION, REPEAT    Surgeon: Betsy Pries, MD MD    Assistant: T. Lanham, CST    Findings:  A viable female  infant with APGARs of 7  and 8  at 1 and 5 minutes respectively.  Weight 7 lb 5 oz (3317 g) .  Normal fallopian tubes and ovaries were seen bilaterally. Extensive scarring of anterior abdominal wall with adhesions to anterior uterus; thin lower uterine segment    Estimated Blood Loss:  700 ml           Specimens:   * No specimens in log *  , None           Complications:  None; patient tolerated the procedure well.      Surgeon: Betsy Pries, MD     Assistants:     Anesthesia: Spinal anesthesia    ASA Class: 2    Procedure Details   The patient was seen in the Holding Room. The risks, benefits, complications, treatment options, and expected outcomes were discussed with the patient.  The patient concurred with the proposed plan, giving informed consent.  The site of surgery properly noted. The patient was taken to Operating Room, identified as Sophee Mckimmy and the procedure verified as C-Section Delivery without surgical sterilization. A Time Out was held and the above information confirmed.    After induction of anesthesia, the patient was draped and prepped in the usual sterile manner. A Pfannenstiel incision was made and carried down through the subcutaneous tissue to the fascia. Fascial incision was made and extended transversely with Mayo scissors. The fascia was separated from the underlying rectus tissue superiorly and inferiorly using blunt and sharp dissection. The peritoneum was identified and entered digitally. Peritoneal incision was extended digitally in a cephalo-caudad fashion. . The utero-vesical peritoneal reflection was incised transversely and the bladder flap was  bluntly freed from the lower uterine segment.    A low transverse uterine incision was made and extended cephalo-caudad.  Infant was delivered atraumatically. After the umbilical cord was clamped and cut cord blood was obtained for evaluation. The placenta was delivered by manual extraction. It was intact and appeared normal.  The uterus was cleared of all clot and debris using a laparotomy sponge.  Uterus, tubes and ovaries appeared normal. The uterine incision was closed in a running locked fashion with 1 Chromic.  A second layer of the same suture was placed in an imbricating manner.  Hemostasis was observed.  The peritoneum was closed using 2-0 Vicryll. The fascia was then reapproximated with a running suture of 1 Vicryl.  The subcutaneous tissue did require closure with 3-0 vicryl. The skin was reapproximated with skin staplesl.    Instrument, sponge, and needle counts were correct after uterine closure, prior the abdominal closure, and at the conclusion of the case.    Betsy Pries, MD MD 12/20/2020

## 2020-12-20 NOTE — Anesthesia Procedure Notes (Signed)
Spinal      Patient location during procedure: OR  Reason for block: labor    Block at Surgeon's request: Yes      Start time: 12/20/2020 10:52 PM    End time: 12/20/2020 10:56 PM    Staffing  Performed: anesthesiologist   Anesthesiologist: Erven Colla, MD      Pre-procedure Checklist   Completed: patient identified, surgical consent, pre-op evaluation, timeout performed, risks and benefits discussed, monitors and equipment checked, anesthesia consent given and correct site  Timeout Completed:  12/20/2020 10:50 PM    Spinal      Patient position: sitting    Sterile Technique: chlorhexidine gluconate and isopropyl alcohol  Skin Local: lidocaine 1%        Approach: midline  Number of attempts: 1      Needle Placement    Needle gauge: 25      Intrathecal space entered at L2-3        Paresthesia Pain: no    Catheter Placement   CSF Return: Yes  Blood Return: No              Assessment   Block Outcome: patient tolerated procedure well, no complications and successful block

## 2020-12-20 NOTE — Transfer of Care (Signed)
Anesthesia Transfer of Care Note    Patient: Kristi Branch    Last vitals:   Vitals:    12/20/20 2348   BP: 118/58   Pulse: 88   SpO2: 98   20        Oxygen: Room Air     Mental Status:awake    Airway: Natural    Cardiovascular Status:  stable

## 2020-12-21 ENCOUNTER — Encounter: Payer: Self-pay | Admitting: Obstetrics & Gynecology

## 2020-12-21 LAB — CBC AND DIFFERENTIAL
Basophils %: 0.3 % (ref 0.0–3.0)
Basophils Absolute: 0 10*3/uL (ref 0.0–0.3)
Eosinophils %: 0 % (ref 0.0–7.0)
Eosinophils Absolute: 0 10*3/uL (ref 0.0–0.8)
Hematocrit: 35.9 % — ABNORMAL LOW (ref 36.0–48.0)
Hemoglobin: 12.5 gm/dL (ref 12.0–16.0)
Lymphocytes Absolute: 1.7 10*3/uL (ref 0.6–5.1)
Lymphocytes: 11.3 % — ABNORMAL LOW (ref 15.0–46.0)
MCH: 27 pg — ABNORMAL LOW (ref 28–35)
MCHC: 35 gm/dL (ref 32–36)
MCV: 78 fL — ABNORMAL LOW (ref 80–100)
MPV: 9.4 fL (ref 6.0–10.0)
Monocytes Absolute: 1.1 10*3/uL (ref 0.1–1.7)
Monocytes: 7.6 % (ref 3.0–15.0)
Neutrophils %: 80.7 % — ABNORMAL HIGH (ref 42.0–78.0)
Neutrophils Absolute: 11.9 10*3/uL — ABNORMAL HIGH (ref 1.7–8.6)
PLT CT: 219 10*3/uL (ref 130–440)
RBC: 4.62 10*6/uL (ref 3.80–5.00)
RDW: 16.8 % — ABNORMAL HIGH (ref 11.0–14.0)
WBC: 14.8 10*3/uL — ABNORMAL HIGH (ref 4.0–11.0)

## 2020-12-21 MED ORDER — PROMETHAZINE HCL 12.5 MG RE SUPP
12.5000 mg | Freq: Four times a day (QID) | RECTAL | Status: DC | PRN
Start: 2020-12-21 — End: 2020-12-23
  Filled 2020-12-21: qty 1

## 2020-12-21 MED ORDER — ACETAMINOPHEN 650 MG RE SUPP
325.0000 mg | RECTAL | Status: DC | PRN
Start: 2020-12-21 — End: 2020-12-23

## 2020-12-21 MED ORDER — SODIUM CHLORIDE (PF) 0.9 % IJ SOLN
3.0000 mL | Freq: Two times a day (BID) | INTRAMUSCULAR | Status: DC
Start: 2020-12-22 — End: 2020-12-23

## 2020-12-21 MED ORDER — CALCIUM GLUCONATE 10 % IV SOLN
1.0000 g | INTRAVENOUS | Status: DC | PRN
Start: 2020-12-21 — End: 2020-12-23

## 2020-12-21 MED ORDER — PRENATAL 27-0.8 MG PO TABS
1.0000 | ORAL_TABLET | Freq: Every day | ORAL | Status: DC
Start: 2020-12-21 — End: 2020-12-23
  Administered 2020-12-21 – 2020-12-23 (×3): 1 via ORAL
  Filled 2020-12-21 (×3): qty 1

## 2020-12-21 MED ORDER — LABETALOL HCL 200 MG PO TABS
200.0000 mg | ORAL_TABLET | Freq: Two times a day (BID) | ORAL | Status: DC
Start: 2020-12-22 — End: 2020-12-21

## 2020-12-21 MED ORDER — SODIUM CHLORIDE (PF) 0.9 % IJ SOLN
0.1000 mg | INTRAMUSCULAR | Status: DC | PRN
Start: 2020-12-21 — End: 2020-12-23

## 2020-12-21 MED ORDER — VH MAGNESIUM SULFATE BOLUS FROM BAG (FOR L&D USE)
4.0000 g | Freq: Once | INTRAVENOUS | Status: AC
Start: 2020-12-21 — End: 2020-12-21
  Administered 2020-12-21: 4 g via INTRAVENOUS
  Filled 2020-12-21: qty 500

## 2020-12-21 MED ORDER — ONDANSETRON 4 MG PO TBDP
4.0000 mg | ORAL_TABLET | Freq: Three times a day (TID) | ORAL | Status: DC | PRN
Start: 2020-12-21 — End: 2020-12-23

## 2020-12-21 MED ORDER — DIPHENHYDRAMINE HCL 50 MG/ML IJ SOLN
25.0000 mg | INTRAMUSCULAR | Status: DC | PRN
Start: 2020-12-21 — End: 2020-12-23
  Administered 2020-12-21 (×2): 25 mg via INTRAVENOUS
  Filled 2020-12-21 (×2): qty 1

## 2020-12-21 MED ORDER — EPHEDRINE SULFATE 50 MG/ML IJ/IV SOLN (WRAP)
10.0000 mg | Status: DC | PRN
Start: 2020-12-21 — End: 2020-12-21

## 2020-12-21 MED ORDER — TETANUS-DIPHTH-ACELL PERTUSSIS 5-2.5-18.5 LF-MCG/0.5 IM SUSY
0.5000 mL | PREFILLED_SYRINGE | INTRAMUSCULAR | Status: DC | PRN
Start: 2020-12-21 — End: 2020-12-23

## 2020-12-21 MED ORDER — LACTATED RINGERS IV SOLN
100.0000 mL/h | INTRAVENOUS | Status: DC
Start: 2020-12-21 — End: 2020-12-23
  Administered 2020-12-21: 06:00:00 100 mL/h via INTRAVENOUS

## 2020-12-21 MED ORDER — IBUPROFEN 600 MG PO TABS
600.0000 mg | ORAL_TABLET | Freq: Four times a day (QID) | ORAL | Status: DC | PRN
Start: 2020-12-21 — End: 2020-12-23
  Administered 2020-12-22 – 2020-12-23 (×5): 600 mg via ORAL
  Filled 2020-12-21 (×6): qty 1

## 2020-12-21 MED ORDER — MISOPROSTOL 200 MCG PO TABS
1000.0000 ug | ORAL_TABLET | Freq: Once | ORAL | Status: DC | PRN
Start: 2020-12-21 — End: 2020-12-23

## 2020-12-21 MED ORDER — DOCUSATE SODIUM 100 MG PO CAPS
200.0000 mg | ORAL_CAPSULE | Freq: Two times a day (BID) | ORAL | Status: DC | PRN
Start: 2020-12-21 — End: 2020-12-23
  Administered 2020-12-23: 11:00:00 200 mg via ORAL
  Filled 2020-12-21: qty 2

## 2020-12-21 MED ORDER — PROMETHAZINE HCL 25 MG PO TABS
25.0000 mg | ORAL_TABLET | Freq: Four times a day (QID) | ORAL | Status: DC | PRN
Start: 2020-12-21 — End: 2020-12-23
  Administered 2020-12-21: 02:00:00 25 mg via ORAL
  Filled 2020-12-21: qty 1

## 2020-12-21 MED ORDER — LACTATED RINGERS IV SOLN
125.0000 mL/h | INTRAVENOUS | Status: AC
Start: 2020-12-21 — End: 2020-12-22
  Administered 2020-12-21: 19:00:00 75 mL/h via INTRAVENOUS

## 2020-12-21 MED ORDER — KETOROLAC TROMETHAMINE 15 MG/ML IJ SOLN
15.0000 mg | Freq: Four times a day (QID) | INTRAMUSCULAR | Status: AC | PRN
Start: 2020-12-21 — End: 2020-12-22
  Administered 2020-12-21 (×2): 15 mg via INTRAVENOUS
  Filled 2020-12-21 (×2): qty 1

## 2020-12-21 MED ORDER — OXYCODONE-ACETAMINOPHEN 5-325 MG PO TABS
1.0000 | ORAL_TABLET | ORAL | Status: DC | PRN
Start: 2020-12-21 — End: 2020-12-23
  Administered 2020-12-22 – 2020-12-23 (×2): 1 via ORAL
  Filled 2020-12-21 (×3): qty 1

## 2020-12-21 MED ORDER — SIMETHICONE 80 MG PO CHEW
80.0000 mg | CHEWABLE_TABLET | Freq: Four times a day (QID) | ORAL | Status: DC | PRN
Start: 2020-12-21 — End: 2020-12-23

## 2020-12-21 MED ORDER — ACETAMINOPHEN 325 MG PO TABS
325.0000 mg | ORAL_TABLET | ORAL | Status: DC | PRN
Start: 2020-12-21 — End: 2020-12-23

## 2020-12-21 MED ORDER — ONDANSETRON HCL 4 MG/2ML IJ SOLN
4.0000 mg | Freq: Three times a day (TID) | INTRAMUSCULAR | Status: DC | PRN
Start: 2020-12-21 — End: 2020-12-23

## 2020-12-21 MED ORDER — LABETALOL HCL 200 MG PO TABS
200.0000 mg | ORAL_TABLET | Freq: Two times a day (BID) | ORAL | Status: DC
Start: 2020-12-21 — End: 2020-12-21

## 2020-12-21 MED ORDER — ENOXAPARIN SODIUM 40 MG/0.4ML SC SOLN
40.0000 mg | SUBCUTANEOUS | Status: DC
Start: 2020-12-21 — End: 2020-12-23
  Administered 2020-12-21 – 2020-12-22 (×2): 40 mg via SUBCUTANEOUS
  Filled 2020-12-21 (×3): qty 0.4

## 2020-12-21 MED ORDER — CALCIUM CARBONATE ANTACID 500 MG PO CHEW
1000.0000 mg | CHEWABLE_TABLET | Freq: Three times a day (TID) | ORAL | Status: DC | PRN
Start: 2020-12-21 — End: 2020-12-23

## 2020-12-21 MED ORDER — VH OXYTOCIN INFUSION 20 UNITS/1000 ML NS (POST PARTUM)
7.5000 [IU]/h | INTRAVENOUS | Status: AC
Start: 2020-12-21 — End: 2020-12-21

## 2020-12-21 MED ORDER — LABETALOL HCL 200 MG PO TABS
200.0000 mg | ORAL_TABLET | Freq: Two times a day (BID) | ORAL | Status: DC
Start: 2020-12-21 — End: 2020-12-23
  Administered 2020-12-21 – 2020-12-23 (×4): 200 mg via ORAL
  Filled 2020-12-21 (×4): qty 1

## 2020-12-21 MED ORDER — SODIUM CHLORIDE (PF) 0.9 % IJ SOLN
3.0000 mL | Freq: Two times a day (BID) | INTRAMUSCULAR | Status: DC
Start: 2020-12-21 — End: 2020-12-23

## 2020-12-21 MED ORDER — HYDRALAZINE HCL 20 MG/ML IJ SOLN
10.0000 mg | INTRAMUSCULAR | Status: DC | PRN
Start: 2020-12-21 — End: 2020-12-23

## 2020-12-21 MED ORDER — VH MAGNESIUM SULFATE INFUSION
2.0000 g/h | INTRAVENOUS | Status: AC
Start: 2020-12-21 — End: 2020-12-22
  Administered 2020-12-21 (×3): 2 g/h via INTRAVENOUS
  Filled 2020-12-21 (×2): qty 500

## 2020-12-21 MED ORDER — MEASLES, MUMPS & RUBELLA VAC IJ SOLR
0.5000 mL | INTRAMUSCULAR | Status: DC | PRN
Start: 2020-12-21 — End: 2020-12-23

## 2020-12-21 NOTE — Lactation Note (Signed)
0945:  In to see mother for ordered lactation consult and to initiate pumping.  Ms. Hernan gave birth via cesarean at [redacted] weeks gestation to a son that is currently in the NICU.    Mom is currently on labor and delivery unit where she is receiving IV magnesium sulfate for 24 hours post delivery.  She previously breastfed her second child (now 40 months of age) for 6 month.  Mom states that she supplemented with formula for the during of breastfeeding.  First child (now 64 years old) received formula after mother nursed for one week.  Denies having previously used a breast pump. Praised patient on her desire to provide this infant with breastmilk.     Discussed the use of the initiation phase.  Mom verbalized understanding that she would switch to the maintenance phase after three consecutive pumps of 20 mL.      Mother has been provided with wash and air dry basins, dish detergent and micro-steam bag and instructed to disassemble and wash pump parts after each use.  Mom verbalized understanding of proper cleaning techniques for pump parts.  Reinforced with her that she should be pumping 8-12 times in 24 hours for a minimum of 15 minutes with one 5 hour stretch at night if needed.  Reviewed pumping at least every 2-3 hours and sooner if desired.       Asking appropriate questions including the best ways to store breastmilk for her baby.  Mom is aware to keep each pumping session separate and understands she needs to refrigerate milk soon after pumping if not able to take directly to her infant in the NICU.  Also understands to freeze milk if it will be over 48 hours before she can get to the hospital after discharge.    Educated patient on proper sizing of flanges if she begins to feel any discomfort with pumping.  At this time 21 mm flange is appropriate.      Following the pumping session I reviewed breast massage and hand expression.  I assisted mom in hand expressing the first couple drops and then she was able to  correctly and independently express small drops of cloudy colostrum. Two sterile Q-tips used to collect pumped and expressed drops of colostrum.  Escorted FOB to NICU to deliver the colostrum and NICU nurse, [facility] RN informed that colostrum was placed in infant's refrigerator.     Encouraged patient to watch [employer] and Hand Expression to reinforce education.  Provided her with Your Guide to Postpartum and Newborn Care booklet and reviewed choice topics in the breastfeeding chapter.      Patient has not received her ACA pump and I encouraged her to contact the [facility] office to begin the process.  After receiving patient's permission I will be sending [facility] referral for a breast pump.  Mom states that she was considering going through [employer] to order a pump as well since she receives [employer] coverage.  Reviewed use of the Harmony manual pump, use of the Symphony pump at the bedside of her infant.    She understands as infant shows readiness to feed at the breast, that she can request staff to call for assistance in educating her on positioning and feeding a pre-term infant.  No further questions at this time.    Afomia Blackley Denyse Dago RNC, BSN, IBCLC

## 2020-12-21 NOTE — Progress Notes (Signed)
Date Time: 12/21/2020  8:28 AM    Progress Note   -  Postpartum Day 1    Delivery Type: Cesarean Section     Subjective:   No complaints. The patient feels well.  She denies issues with pain.  She is ambulating without dizziness.  She is voiding without difficulty.  .  The RN reports Amount: Small lochia.    Objective:   Temp:  [97.7 F (36.5 C)-98.1 F (36.7 C)] 98.1 F (36.7 C)  Heart Rate:  [71-117] 93  Resp Rate:  [16-18] 18  BP: (116-196)/(59-92) 128/81     Physical Exam:   Gen:  Alert and oriented, no acute distress  Abd: soft, nontender, fundus firm at umbilicus  Ext: no calf tenderness    LABS:     Results     Procedure Component Value Units Date/Time    CBC and differential [[id number]] Collected: 12/21/20 0713    Specimen: Blood Updated: 12/21/20 0755    Type and Screen [[id number]] Collected: 12/20/20 2114    Specimen: Blood Updated: 12/20/20 2210     ABO Rh O Positive     AB Screen NEGATIVE    Urine Drug Screen [[id number]] Collected: 12/20/20 2100    Specimen: Urine, Random Updated: 12/20/20 2200     Urine Creatinine Random 85 mg/dL      pH, Urine 6.0 pH      Urine Specific Gravity 1.027     Urine Amphetamine Negative     Urine Barbiturates Negative     Urine Benzodiazepines Negative     Urine Cannabinoids Negative     Urine Cocaine Negative     Urine Methadone Screen Negative     Urine Opiates Negative     Urine Oxycodone Negative     Urine Phencyclidine Negative     Propoxyphene Negative     Urine Buprenorphine Negative     Urine Ecstasy Screen Negative     Urine Fentanyl Screen Negative    Protein / creatinine ratio, urine [[id number]]  (Abnormal) Collected: 12/20/20 2100    Specimen: Urine, Random Updated: 12/20/20 2154     Urine Protein 10 mg/dL      Urine Creatinine Random 85 mg/dL      Protein/Creatinine Ratio 0.12 Ratio     Comprehensive metabolic panel [[id number]]  (Abnormal) Collected: 12/20/20 2113    Specimen: Plasma Updated: 12/20/20 2145     Sodium 135 mMol/L      Potassium 4.3 mMol/L       Chloride 106 mMol/L      CO2 17 mMol/L      Calcium 10.0 mg/dL      Glucose 83 mg/dL      Creatinine 2.44 mg/dL      BUN 8 mg/dL      Protein, Total 6.9 gm/dL      Albumin 2.6 gm/dL      Alkaline Phosphatase 134 U/L      ALT 12 U/L      AST (SGOT) 24 U/L      Bilirubin, Total 0.3 mg/dL      Albumin/Globulin Ratio 0.60 Ratio      Anion Gap 16.3 mMol/L      BUN / Creatinine Ratio 13.1 Ratio      EGFR 120 mL/min/1.21m2      Osmolality Calculated 268 mOsm/kg      Globulin 4.3 gm/dL     Uric acid [[id number]] Collected: 12/20/20 2113    Specimen: Plasma Updated: 12/20/20 2145  Uric acid 4.9 mg/dL     CBC without differential [[id number]]  (Abnormal) Collected: 12/20/20 2113    Specimen: Blood Updated: 12/20/20 2128     WBC 9.9 K/cmm      RBC 4.81 M/cmm      Hemoglobin 12.0 gm/dL      Hematocrit 40.9 %      MCV 76 fL      MCH 25 pg      MCHC 33 gm/dL      RDW 81.1 %      PLT CT 334 K/cmm      MPV 8.0 fL           Assessment:   Patient doing well. S/p Pre-term C/S at 35wks due to severe pre-eclampsia, now on MgSO4 for seizure prophylaxis    PLAN:   Continue routine postpartum care.  BP stable on MgSO4. Has not required any more IV anti-hypertensives since the delivery. No s/s of MgSO4 toxicity  Continue current care    Cresenciano Lick, MD  12/21/2020

## 2020-12-21 NOTE — Consults (Signed)
See lactation note.

## 2020-12-21 NOTE — UM Notes (Signed)
[facility] Utilization Management Review Sheet    Facility :  [facility]    NAME: Kristi Branch  MR#: [id number]    CSN#: [id number]    ROOM: 2709/2709-A AGE: 30 y.o.    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 Treated in ED.    Hypertension     chronic     Obesity        DATE OF REVIEW: 12/21/2020    VITALS: BP 128/81    Pulse 93    Temp 98.1 F (36.7 C) (Oral)    Resp 18    Ht 1.6 m (5\' 3" )    Wt 136.5 kg (301 lb)    LMP 04/05/2020    SpO2 100%    Breastfeeding Yes    BMI 53.32 kg/m     Active Hospital Problems    Diagnosis    Pregnancy     324401:  G3P2 @ 35 wks 0 days gestation admitted with complaints of edema lower extremities, headache and can't breathe. Severe BP elevation: 190/90, 185/91.  Hydralazine IV x 3.  Repeat C/S.  G3P3  EDC:  [id number]  C/S on 022322 @ 11:19 pm  Female 7 lbs 5 oz 3317 g  Apgar:  7/8  Gestation:  35 wks 0 days   Baby to NICU.    027253:  Magnesium sulfate 4 g bolus to 2 g/hr IV gtt x 24 hrs.    MCG:  S-350    Rush Barer. Owen Pratte, RN   Utilization Review Nurse  Care Management   [facility]  [address]  [place] [address]  NPI:  [id number]  Ph: [phone]  F:  [phone]  [email or web address]

## 2020-12-22 NOTE — Progress Notes (Signed)
Date Time: 12/22/2020  7:46 AM    Progress Note   -  Postpartum Day 2    Delivery Type: Cesarean Section     Subjective:   No complaints. The patient feels well.  She denies issues with pain.  She is ambulating without dizziness.  She is voiding without difficulty.  .  The RN reports Amount: Small lochia.    Objective:   Temp:  [96.8 F (36 C)-99.4 F (37.4 C)] 97.9 F (36.6 C)  Heart Rate:  [77-100] 92  Resp Rate:  [16-18] 17  BP: (108-145)/(57-82) 144/82     Physical Exam:   Gen:  Alert and oriented, no acute distress  Abd: soft, nontender, fundus firm at umbilicus  Ext: no calf tenderness    LABS:     Results     Procedure Component Value Units Date/Time    CBC and differential [[id number]]  (Abnormal) Collected: 12/21/20 0713    Specimen: Blood Updated: 12/21/20 0835     WBC 14.8 K/cmm      RBC 4.62 M/cmm      Hemoglobin 12.5 gm/dL      Hematocrit 40.9 %      MCV 78 fL      MCH 27 pg      MCHC 35 gm/dL      RDW 81.1 %      PLT CT 219 K/cmm      MPV 9.4 fL      Neutrophils % 80.7 %      Lymphocytes 11.3 %      Monocytes 7.6 %      Eosinophils % 0.0 %      Basophils % 0.3 %      Neutrophils Absolute 11.9 K/cmm      Lymphocytes Absolute 1.7 K/cmm      Monocytes Absolute 1.1 K/cmm      Eosinophils Absolute 0.0 K/cmm      Basophils Absolute 0.0 K/cmm           Assessment:   Patient doing well.    PLAN:   Continue routine postpartum care.  BP improved post delivery and now on meds.  Baby in NICU      Mauricia Area, MD  12/22/2020

## 2020-12-22 NOTE — Lactation Note (Signed)
1400:  Patient called to speak with LC.  Mom states concern that she is pumping drops of colostrum.  Reassured her that at this time it is normal, but encouraged her to continue pumping every 2-3 hours or 8-12 times in a 24 hour period with a 5 hour rest period during the night.      Mom denies nipple discomfort, tenderness or breakdown.  States 21 mm flanges remain a comfortable fit.      Patient states that she did contact [facility] office and electric breast pump is ready for pick up today.  Mother plans to obtain breast pump on Monday after she is discharged from the hospital.  Encouraged her to use Medela Symphony breast pump available in her son's NICU room on Sunday after discharge from mother/baby unit.  Parent has been instructed on use of Harmony hand pump as well.    Plan to follow up with mother on Monday, 12/25/2020.  LC coverage will not be available over the weekend.  Patient denies any further questions or concerns.    Nivin Braniff Denyse Dago RNC, BSN, IBCLC

## 2020-12-22 NOTE — Plan of Care (Signed)
Problem: Vaginal/Cesarean Delivery  Goal: Postpartum management of pain/discomfort  Description: Interventions:  1. Assess pain using a consistent, developmental/age appropriate pain scale  2. Assess pain level before and following intervention  3. Include patient/patient care companion in decisions related to pain management  4. Monitor for post anesthesia issues related to pain management  5. Offer non-pharmacologic pain management interventions  6. Monitor pain infusion pumps  7. Report ineffective pain management to LIP  8. Consult/Collaborate with Anesthesia or Pain Service  Outcome: Progressing  Goal: Breasts are soft with nipple integrity intact  Description: Interventions:  1. Perform breast/nipple assessment  2. Assess and manage engorgement  3. Breastfeed and/or pump breasts at least 8-12 times within 24 hours  4. Ensure proper positioning and latch  5. Provide pharmacologic and non-pharmacologic interventions as needed  Outcome: Progressing  Goal: Gastrointestinal/Urinary management  Description: Interventions:   1. Use CAUTI prevention techniques  2. Maintain urine output of 30/mL per hour or greater  3. Monitor intake and output per orders  4. Adequate bladder emptying per phase of care  5. Offer pharmacologic and non-pharmacologic GI management interventions  Outcome: Progressing  Goal: Uterine management  Description: Interventions:  1. Assess fundus and notify LIP if not firm, midline, or at or below the umbilicus, or if abdomen is abnormally distended  2. Assess for hemorrhage risk using appropriate screening tool  Outcome: Progressing  Goal: Perineum will be clean, dry, and intact and without discharge or hematoma  Description: Interventions:  1. Place cold pack on perineum  2. Provide pericare  3. Sitz bath PRN as ordered by LIP  4. Monitor wound/incision for signs of infections  5. Assess for hemorrhoids and provide interventions  Outcome: Progressing  Goal: Incision will be clean, dry, and  intact and without discharge or hematoma  Description: Interventions:  1. Assess abdominal dressing and incision  2. Monitor incision for signs of infections  3. Assess incision for bleeding/hematoma  4. Assess incision for evidence of healing  Outcome: Progressing  Goal: VTE Prevention  Description: Interventions:  1. Administer anticoagulant(s) and/or apply anti-embolism stockings/devices as ordered  2. Patient ambulating with assistance  3. Patient ambulating independently  4. Assess skin color, turgor, peripheral pulses, perfusion, and presence of edema, e.g. capillary refill  Outcome: Progressing  Goal: Evidence of positive mother-baby interactions  Description: Interventions:  1. Include patient/patient care companion in decisions related to care  2. Initiate skin to skin  3. Initiate safety and falls prevention interventions  4. Assess emotional status and coping mechanisms  5. Encourage rooming in and infant feeding on demand  6. Ensure parent/caregiver provides infant care  7. Assess parent/caregiver engagement and awareness of infant cues/behavior  8. Notify LIP and case management if risk factors are identified  Outcome: Progressing

## 2020-12-22 NOTE — Consults (Signed)
Acute Pain Management (Inpatient consult)  Consult performed by: Ledon Snare, NP  Consult ordered by: Erven Colla, MD  Reason for consult: IT morphine for postCS surgery pain      [facility] PAIN MANAGEMENT PROGRESS NOTE    CC:  Post-duramorph/opioid neuraxial injection    Kristi Branch is a 30 y.o. female, POD #2 S/P Repeat cesarean section under spinal block with morphine 200 mcg for post operative pain management. She was in L&D until last night for magnesium drip. She reports good pain control up to this morning and endorses mild soreness with movement so far. Nsaids given sporadically since surgery and no opioids taken thus far. She endorses only mild itching at immediate postop and none today. She also denies  urinary retention, N/V, HA, back pain or leg numbness. She is able to ambulate well and visited her baby NICU this morning.    Past Medical History:   Diagnosis Date   . Anemia    . 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 Treated in ED.   Marland Kitchen Hypertension     chronic    . Obesity      Past Surgical History:   Procedure Laterality Date   . CESAREAN SECTION N/A 08/07/2015    Procedure: CESAREAN SECTION;  Surgeon: Levander Campion, MD;  Location: [facility];  Service: Obstetrics;  Laterality: N/A;   . CESAREAN SECTION, REPEAT Bilateral 05/07/2019    Procedure: CESAREAN SECTION, REPEAT;  Surgeon: Truddie Crumble, MD;  Location: [facility];  Service: Obstetrics;  Laterality: Bilateral;   . CESAREAN SECTION, REPEAT Bilateral 12/20/2020    Procedure: CESAREAN SECTION, REPEAT;  Surgeon: Truddie Crumble, MD;  Location: [facility];  Service: Obstetrics;  Laterality: Bilateral;     Social History     Tobacco Use   . Smoking status: Never Smoker   . Smokeless tobacco: Never Used   Substance Use Topics   . Alcohol use: No     Allergy     Patient has no known allergies.    Medication     Current Facility-Administered Medications   Medication Dose Route Frequency    . enoxaparin  40 mg Subcutaneous Q24H   . labetalol  200 mg Oral Q12H   . prenatal vitamin  1 tablet Oral Daily   . sodium chloride (PF)  3 mL Intravenous Q12H SCH   . sodium chloride (PF)  3 mL Intravenous Q12H SCH     PRN Meds: acetaminophen **OR** acetaminophen, calcium carbonate, calcium GLUConate, diphenhydrAMINE, docusate sodium, hydrALAZINE, ibuprofen, ketorolac, measles, mumps & rubella vaccine, miSOPROStol, naloxone, ondansetron **OR** ondansetron, oxyCODONE-acetaminophen, promethazine **OR** promethazine, simethicone, TdaP Booster  ROS   All other systems are negative except for those mentioned above.  Physical     BP (!) 146/97   Pulse 98   Temp 97.9 F (36.6 C) (Temporal)   Resp 17   Ht 1.6 m (5\' 3" )   Wt 136.5 kg (301 lb)   LMP 04/05/2020   SpO2 99%   Breastfeeding Yes   BMI 53.32 kg/m     Pain:                           Site:  Lower abdomen incision  Pain Assessment  Charting Type: Assessment (12/22/20 0813)  Pain Scale Used: Numeric Scale (0-10) (12/22/20 0813)        General appearance --WDWN, adult female, alert, well  appearing, and in no distress.  Mental status -alert, oriented to person, place, and time  Psych - appropriate affect and mood, converse well, good eye contact.  Abdomen - gravid appearing, tender/sore as expected, no nv    Neurological - no tremors, no headache;  BLE sensory grossly intact.  Musculoskeletal - spinal injection site without erythema/tenderness/no fluance.  BLE gross motor intact.  Extremities -peripheral pulses normal, trace pedal edema, no cyanosis  Skin -normal coloration and good turgor, no rashes.     No results for input(s): BUN, CREAT, AST, ALT, ALP in the last 24 hours.    Invalid input(s): BILIT    PDMP     Results include the following medication:   Oxycodone  Consistent:  1 Rx 2020    Impression   30 y.o. female with Maternal care due to low transverse uterine scar from previous cesarean delivery [O34.211]  Chronic hypertension affecting pregnancy  [O10.919]  Pregnancy [Z34.90], s/p Procedure(s):  CESAREAN SECTION, REPEAT for prior c/s; chronic hypertension, with  1.  Spinal block - fully resolved without apparent anesthesia complications  2.  Acute pain - good control    Plan     1. Reviewed plan of care for pain management to maintain function/comfort level. Patient understands that she has options for analgesia should nsaids not sufficient.  2. Advised patient on multimodal pain management strategies as well as side effect management. Discussed bowel regimen/encouraged use.  3. Discussed with patient pain expectations and balancing approach with meds safety/function.  4. Advised on strategies to taper off analgesics with decreasing pain and healing.  5. Patient to notify staff/providers while inpatient or when discharged for symptoms of spinal injection/anesthesia complications, including new onset back pain, redness/discharge from the injection site, or BLE numbness and weakness.    Cleatis Polka DNP  Pain Service  12/22/2020

## 2020-12-23 MED ORDER — LABETALOL HCL 200 MG PO TABS
200.0000 mg | ORAL_TABLET | Freq: Two times a day (BID) | ORAL | 1 refills | Status: AC
Start: 2020-12-23 — End: ?

## 2020-12-23 MED ORDER — OXYCODONE-ACETAMINOPHEN 5-325 MG PO TABS
1.0000 | ORAL_TABLET | ORAL | 0 refills | Status: AC | PRN
Start: 2020-12-23 — End: ?

## 2020-12-23 NOTE — Discharge Instr - AVS First Page (Signed)
Postpartum Instructions        Activity and Exercise:  For the first two weeks at home, you should take care of only yourself and the baby. If you climb stairs, do it slowly. Try to limit it to 2 trips up and down a day for the first week or so. Do not lift anything heavy (over 10-15 pounds) for the first 2 weeks. You can do any exercise that is comfortable for you after the first two weeks. You may drive a car when you are comfortable sitting and not taking narcotic pain medications. How you feel is the best gauge in deciding how much you should do. If you feel tried, you should rest. Just start slow and build up your activity over time.    Self Care:  You can take showers and you can wash your hair. Until your bleeding stops, you should continue to wash your bottom with the squeeze bottle given you in the hospital.  You may also use Tucks for your hemorrhoids. You should change your pads each time you use the bathroom. DO NOT use tampons until after your return visit to the clinic.    Cesarean Section Mothers:  No Special care of your incision is necessary. Keep it clean with soap and water or with peroxide. No bandage is needed, but you may use one if you desire. You need to return to the clinic in one week after delivery for staple removal.  If staples were removed prior to discharge, then the steri-strip tape may stay on for up to a week. If they do come off it is not a problem.  Call the office if you have any pain, swelling, redness and/or oozing from your incision or if you have a fever higher than 101º F.    Bleeding:  Expect your bleeding to gradually decrease soon after you are home. It is not unusual to have occasional spotting, but the bleeding should never be heavier than a normal period. If you are not nursing your baby, your first menstrual periods will return about 6-8 weeks after the baby’s birth. If you are nursing your baby, you may have a period for several months. You CAN still get pregnant  during this time unless you use some form of birth control. If you choose to have intercourse before your 6-week check-up, use contraceptive foam and condoms.    Afterbirth Pain and Cramping:  You may take Tylenol™ or ibuprofen (Advil™) every 4 hours for cramping or afterbirth pains. Use a heating pad or hot water bottle as often as you want for aches and pains. Mild aches and pains should be better in 5-7days. If you are not improving, call us.    Diet:  You can eat or drink anything you like. You should continue to take your prenatal vitamins if you are nursing. If you are not nursing, or if you stop after a few days or weeks, take a multivitamin each day, like Centrum™ or Theragran-M™. If you have any questions, you can talk to the nutritionist. Don’t forget to get WIC checks for your baby if you are eligible.    Constipation:  Eating a diet that includes fresh fruit, vegetables, and lots of liquids should help you avoid constipation. If you do become constipated, you may take warmed prune juice, Milk of Magnesia™, Metamucil™ or a stool softener like Colace™.    Breast Care:  Breastfeeding: Breast milk is the natural first food for babies and contributes to the health   and well-being of mothers. Exclusively breastfeeding for the first 6 months is the optimal way of feeding your infant. If you are breastfeeding your baby, wear a supportive bra. If your nipples become sore or cracked, air-dry them completely after each feeding and change the position of the baby at each feeding. If your breasts become painful and tender, and you have a fever of 101º For greater, call us.  Formula Feeding: IF you are formula feeding your baby or if you stop nursing your baby, your breasts may require “drying up” treatment. During this time, they will become bigger, tender and uncomfortable for no longer than 36 hours. To decrease the discomfort, wear a tight bra day and night, place ice packs to the breast and DO NOT take hot showers  or stimulate or pump your breast in any way, as this only increases the milk supply.    Vaginal care:  You should not have sexual intercourse until you have had your 6-week check-up. This will give your stitches time to heal. Nothing should be placed within the vagina for the first 6 weeks after a delivery vaginal or cesarean, including tampons.  Also soaking in a tub, poor, or spa is not advised until after your 6 week follow-up visit.  Sitz bath, Tucks pads, and Dermoplast are helpful.  Vaginal stitches tend to dissolve over 1-2 weeks.    The “Blues” :  Being a new mother can be a difficult time, especially if you feel alone or have recently moved. Call the clinic if you have any signs of what we call the “baby blues.” These include loss of appetite, being unable to sleep at night, crying episodes that become frequent, or having thoughts that don’t seem to make sense.        CALL THE DOCTOR:  · You fill one sanitary pad per hour or more  · You have a temperature above 101º F  · Your vaginal discharge smells foul  · Your bottom is swollen, reddened or extremely tender  · You have painful or frequent urination  · Your breasts become very tender, red, or swollen  · Your nipples are unusually sore or bleeding

## 2020-12-23 NOTE — Plan of Care (Signed)
Went over newborn [place] instructions.

## 2020-12-23 NOTE — Progress Notes (Signed)
Date Time: 12/23/2020  8:22 AM    Progress Note   -  Postpartum Day 3    Delivery Type: Cesarean Section     Subjective:   No complaints. The patient feels well.  She denies issues with pain.  She is ambulating without dizziness.  She is voiding without difficulty.  .  The RN reports Amount: Small lochia.    Objective:   Temp:  [97.3 F (36.3 C)-97.5 F (36.4 C)] 97.5 F (36.4 C)  Heart Rate:  [81-104] 91  Resp Rate:  [16-19] 19  BP: (125-156)/(69-88) 128/86     Physical Exam:   Gen:  Alert and oriented, no acute distress  Abd: soft, nontender, fundus firm at umbilicus  Ext: no calf tenderness    LABS:     Results     ** No results found for the last 24 hours. **          Assessment:   Patient doing well.    PLAN:   Continue routine postpartum care., Discharge home., Remove staples prior to discharge. and Follow up one week.      Kristi Area, MD  12/23/2020

## 2020-12-23 NOTE — Discharge Summary (Signed)
OBSTETRICS - DELIVERY DISCHARGE SUMMARY    12/23/2020 8:24 AM    Admission Date: 12/20/2020  Discharge Date: 12/23/20     Discharge Diagnosis:  Pre-term intrauterine pregnancy now delivered via repeat cesarean section .    Date of Delivery: 12/20/2020    Time of Delivery:  11:19 PM    Delivered By: Jeffie Pollock T   Delivery Type: Cesarean   EDC: Estimated Date of Delivery: 01/24/21 Gestational Age: [redacted]w[redacted]d  Baby: Liveborn Female ; Apgar 1 minute: 7  Apgar 5 minute: 8 ; Birth Weight: 7 lb 5 oz (3317 g)   Anesthesia:   Delivery Complications: None.  Laceration:   Laceration Type: None  degree. Episiotomy: None     Placenta: , , , , Cord:      Feeding Method:      Patient Active Problem List   Diagnosis   . History of cesarean section   . History of postpartum hemorrhage   . Maternal morbid obesity, antepartum   . BMI 50.0-59.9, adult   . History of cesarean delivery   . Pedal edema   . Pregnancy       Immunization History   Administered Date(s) Administered   . Tdap 05/04/2019       Hospital Course: Kristi Branch is a 30 y.o. female admitted to the [facility] and delivered via Cesarean  delivery. Her postpartum course was uncomplicated. The patient's vital signs remained stable and the patient remained afebrile throughout her hospitalization. She was discharged to home in stable condition.    Postpartum Complications: None.    Discharge Medications:  Current Discharge Medication List      START taking these medications    Details   labetalol (NORMODYNE) 200 MG tablet Take 1 tablet (200 mg total) by mouth every 12 (twelve) hours  Qty: 60 tablet, Refills: 1      oxyCODONE-acetaminophen (PERCOCET) 5-325 MG per tablet Take 1-2 tablets by mouth every 4 (four) hours as needed for Pain  Qty: 20 tablet, Refills: 0         CONTINUE these medications which have NOT CHANGED    Details   ferrous sulfate 325 (65 FE) MG tablet Take 325 mg by mouth every morning with breakfast      Prenatal MV-Min-Fe Fum-FA-DHA  (PRENATAL 1 PO) Take by mouth           Discharge Followup:  Unless otherwise instructed:  For this cesarean section we recommend that you followup in one (1) week for incision and BP check at the site of your prenatal care and then also in 6 weeks for a postpartum checkup    Discharge Instructions:  Patient to follow discharge instructions as provided at the time of discharge.    Mauricia Area, MD

## 2020-12-26 ENCOUNTER — Ambulatory Visit: Payer: Self-pay

## 2020-12-26 NOTE — Lactation Note (Signed)
This note was copied from a baby's chart.  1345:  In to follow up with MOB who has been pumping to provide her son with EBM.  At this time she states collecting @ 15 ml per pumping session, and is pumping on average 7 times/day.  Mother has been using a hand pump since leaving the hospital, but states that she received her electric Medela double breast pump through her insurance and plans to start using it tonight.  Encouraged to pump at her son's bedside.  Provided MOB with Medela Symphony breast pump and supplies.  At this time she is holding her son skin to skin and plans to pump prior to leaving hospital this evening.    Reminded MOB to pump at least 8 times in a 24 hour period for 15-20 minutes.  States that 21 mm flange still feels appropriate.  Mom denies nipple pain or breakdown.       She understands as infant shows readiness to feed at the breast, that she can request staff to call for assistance in educating her on positioning and feeding a pre-term infant.  Infant currently requiring oxygen (2.5L), and is not to nurse directly at the breast.  Mother denies further questions at this time.    Esaw Knippel Denyse Dago RNC, BSN, IBCLC

## 2021-02-01 ENCOUNTER — Telehealth: Payer: Self-pay

## 2021-02-01 NOTE — Telephone Encounter (Signed)
This liaison placed a follow-up call to patient per referral from her OB / GYN provider.  Contact information provided in a voice message.  Will follow-up on 02/06/2021.

## 2021-02-02 ENCOUNTER — Telehealth: Payer: Self-pay

## 2021-02-02 NOTE — Telephone Encounter (Signed)
This liaison received a return call from patient per referral from her OB / GYN provider.  Information on perinatal behavioral health services provided.  Patient completed a PHQ-9 screening and her score of 15 reflects a moderate level of risk of developing depressive symptoms.  She shared that she is experiencing symptoms of depression including depressed mood, low motivation, cries easily, problems sleeping, and no appetite.  Patient shared that she started feeling stressed with the birth of her third baby in February because he came early and was in the NICU.  He is home now and is doing ok.  She shared that she is also experiencing relationship stressors with the FOB, but they are trying to work through it.  She has the support of the FOB, her grandmother, and her friend.  She shared that she is not experiencing any thoughts to harm herself or others.  She verbally committed to safety, to talking with her supports, and to going to the nearest ED if she did have these thoughts.  She is not using tobacco, alcohol, or drugs.  She does have a family history of depression, anxiety, and bipolar disorder.  She shared that she is not experiencing any symptoms of mania.  She has not participated in counseling or medication management in the past.  She shared that she has what she needs for her children: her eldest child receives services through school, and she has services with [facility] and [facility].  She shared that she is interested in counseling and help to loose weight.  In discussing these concerns, we agreed that she would benefit from a referral to [facility] for counseling, and to [facility].  Referral information for referrals, Postpartum Support online support groups and warm line number, and local concern line numbers was sent to [facility] in a secure email.  [name] agreed to follow-up with this liaison in 2 weeks on 02/16/2021.  In total 30 minutes  personal time was spent with [name].

## 2021-02-02 NOTE — Telephone Encounter (Signed)
This liaison placed a follow-up call to patient per referral from her OB / GYN provider.  Contact information provided in a voice message.  Will follow-up on 02/06/2021.

## 2021-02-16 ENCOUNTER — Telehealth: Payer: Self-pay

## 2021-02-16 NOTE — Telephone Encounter (Signed)
This liaison placed a follow-up call to [facility] per referral from her OB / GYN provider for perinatal behavioral health services.  Kristi Branch completed a PHQ-9 and her score of 14 reflects a mild level of risk for developing depressive symptoms. She shared that she still feels down even though some things are better since she started back to work.  She likes work and time away.  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  Difficult for her to sleep and she feels tired.  Discussed the option for medication.  We agreed that she would benefit from a referral for medication as well as counseling.  The referral information for [facility] was resent to patient and she agreed to let this liaison know if she has any problems opening the secure email. Patient agreed to follow-up with this liaison in two weeks on 03/02/2021.  In total 10 minutes personal time was spent with patient.

## 2021-02-27 NOTE — Progress Notes (Signed)
This liaison received an email from patient sharing that she has not yet heard from [facility] for medication management and counseling services.  This liaison placed a call to patient and left contact information.  This liaison also sent a secure email offering to discuss other options to get patient connected to services.

## 2021-02-28 ENCOUNTER — Telehealth: Payer: Self-pay

## 2021-02-28 NOTE — Telephone Encounter (Signed)
This liaison received a returned call from patient per referral from her OB / GYN provider for perinatal behavioral health services.  Patient completed a  PHQ-9 screening and her score of 15 reflects a moderate  level of risk of developing depressive symptoms.  She completed a GAD-2 screening and her score of 1 does not reflect a level of risk of developing anxiety symptoms.      Patient shared that she wants to feel better.  She feels depressed, cries for no reason, is exhausted, has problems falling asleep at times, has no appetite, and she has poor concentration.  She is working extra hours and continues to try to find a house to rent.  She has not rented before, so she is often turned down.  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     Patient shared that she is not having thoughts to harm herself or others.  She verbally committed to safety, to talking with her supports, and to going to the nearest ED if she did have these thoughts.         In discussing these concerns and the concern that she has not received a response from [place], we agreed that she would benefit from a new referral for counseling and medication management services.   This liaison will send referral information for [facility]  to patient in a secure email along with the release of information form for her to review and sign and return so the referral can be completed.  Information for [facility] and a release of information form for [facility] will also be sent.  Local concern line numbers will be provided.  Discussed a consult with the perinatal behavioral health psychiatrist to consider medication.  This liaison will follow-up with patient and her OB provider's office after the consultation.     In total, 15 minutes of personal time was spent with patient.

## 2021-02-28 NOTE — Progress Notes (Signed)
This liaison met with patient at [facility] to review and sign ROIs for [facility] and [facility].  This liaison will complete the referrals.  In total 5 minutes personal time was spent with patient.

## 2021-02-28 NOTE — Telephone Encounter (Signed)
This liaison placed a follow-up call to patient and shared that a psychiatric consultation was done by this liaison on behalf of patient--based on referral information received by patient's OBGYN.   Based on patient's symptoms, psychiatric consultant identified the following course of action: start on Zoloft 25 mg QD for two weeks and then increasing it to 50 mg QD if tolerated. Psychiatric consultant advised that patient be made aware that Zoloft may take several weeks to be effective.    The psychiatric consultant shared the following side effects; which were reviewed with the patient by this liaison: headaches, stomachaches, possible sexual side effects which usually get better.  There is a risk of serotonin syndrome (fever, sweating, tremor, elevated VS and confusion.  This is not a common side-effect but can be dangerous so patient should contact her OBGYN provider if she has flu like symptoms on starting the medication).  There is a possible increase in suicidal thoughts for individuals age 32 and younger. Patient's OBGYN office would need to monitor for side effects.    The patient was advised to contact her OBGYN should she experience any of those side effects.  Patient was advised that if she experiences suicidal symptoms, excessive energy, decreased need for sleep, or mood liability, or if her symptoms get worse, to go to the nearest emergency room.    Patient was given details about this plan by this liaison, and patient expressed understanding of the plan and potential side effects to watch out for.  Patient was agreeable to this plan and identified she has no further questions.    Information about this plan was relayed to patient's OBGYN.  Plan is for patient's OBGYN to call patient once the patient's prescription has been called in.

## 2021-03-14 ENCOUNTER — Telehealth: Payer: Self-pay

## 2021-03-14 NOTE — Telephone Encounter (Signed)
This liaison placed a follow-up call to patient per referral from her OB / GYN provider for perinatal behavioral health services.  Contact information provided in a voice message.  Will follow-up on 03/30/2021.

## 2021-03-30 ENCOUNTER — Telehealth: Payer: Self-pay

## 2021-03-30 NOTE — Telephone Encounter (Signed)
This liaison placed a second follow-up call to patient per referral from her OB / GYN provider for perinatal behavioral health services.  Contact information provided in a voice message.

## 2021-04-02 ENCOUNTER — Ambulatory Visit (INDEPENDENT_AMBULATORY_CARE_PROVIDER_SITE_OTHER): Payer: Self-pay

## 2021-05-02 ENCOUNTER — Encounter (INDEPENDENT_AMBULATORY_CARE_PROVIDER_SITE_OTHER): Payer: Self-pay | Admitting: Surgery

## 2021-05-02 ENCOUNTER — Telehealth (INDEPENDENT_AMBULATORY_CARE_PROVIDER_SITE_OTHER): Payer: Self-pay | Admitting: Surgery

## 2021-05-02 NOTE — Telephone Encounter (Signed)
Called patient to review surgical benefits. Advised that benefit information is not a guarantee of payment, claims are paid based on medical necessity, recommended patient call to verify.Reviewed criteria and benefit information along with $100.00 program fee. Patient interested in scheduling consult, appointment made, no other questions. MC activation sent.

## 2021-06-18 ENCOUNTER — Encounter (HOSPITAL_COMMUNITY): Payer: Self-pay

## 2021-06-18 ENCOUNTER — Emergency Department (HOSPITAL_COMMUNITY)
Admission: EM | Admit: 2021-06-18 | Discharge: 2021-06-18 | Disposition: A | Discharge status: Home / Self Care | Payer: Medicaid Other | Attending: Emergency Medicine | Admitting: Emergency Medicine

## 2021-06-18 ENCOUNTER — Other Ambulatory Visit: Payer: Self-pay

## 2021-06-18 DIAGNOSIS — O219 Vomiting of pregnancy, unspecified: Secondary | ICD-10-CM | POA: Diagnosis present

## 2021-06-18 DIAGNOSIS — Z3A01 Less than 8 weeks gestation of pregnancy: Secondary | ICD-10-CM | POA: Diagnosis not present

## 2021-06-18 LAB — POC URINE PREG, ED: Preg Test, Ur: POSITIVE — AB

## 2021-06-18 NOTE — ED Triage Notes (Signed)
Patient here requesting pregnancy test because her doctor requested, no complaints. LMP 7/1

## 2021-06-18 NOTE — ED Provider Notes (Signed)
[facility] EMERGENCY DEPARTMENT Provider Note   CSN: [id number] Arrival date & time: 06/18/21  9937     History No chief complaint on file.   Leslie Ali is a 30 y.o. female. She is a [id number]. Presents to the ED to take a pregnancy test. States that she is new to the area, and the OBGYN that she called would not accept her unless she had a positive pregnancy test result from another doctor. Her LMP is 04/27/21. She is having n/v in the morning. Denies abdominal pain, vaginal bleeding.       History reviewed. No pertinent past medical history.  There are no problems to display for this patient.   History reviewed. No pertinent surgical history.   OB History   No obstetric history on file.     No family history on file.     Home Medications Prior to Admission medications   Not on File    Allergies    Patient has no known allergies.  Review of Systems   Review of Systems  Gastrointestinal:  Positive for nausea and vomiting. Negative for abdominal pain.  Genitourinary:  Positive for menstrual problem. Negative for pelvic pain, vaginal bleeding and vaginal discharge.  Neurological:  Negative for dizziness, syncope and headaches.  All other systems reviewed and are negative.  Physical Exam Updated Vital Signs BP (!) 165/95 (BP Location: Right Arm)   Pulse 81   Temp 98.4 F (36.9 C)   Resp 17   SpO2 100%   Physical Exam Vitals and nursing note reviewed.  Constitutional:      General: She is not in acute distress.    Appearance: She is obese. She is not toxic-appearing.  HENT:     Head: Normocephalic and atraumatic.  Eyes:     General: No scleral icterus.       Right eye: No discharge.        Left eye: No discharge.  Pulmonary:     Effort: No respiratory distress.  Neurological:     Mental Status: She is alert.  Psychiatric:        Mood and Affect: Mood normal.        Behavior: Behavior normal.    ED Results / Procedures /  Treatments   Labs (all labs ordered are listed, but only abnormal results are displayed) Labs Reviewed  POC URINE PREG, ED - Abnormal; Notable for the following components:      Result Value   Preg Test, Ur POSITIVE (*)    All other components within normal limits    EKG None  Radiology No results found.  Procedures Procedures   Medications Ordered in ED Medications - No data to display  ED Course  I have reviewed the triage vital signs and the nursing notes.  Pertinent labs & imaging results that were available during my care of the patient were reviewed by me and considered in my medical decision making (see chart for details).    MDM Rules/Calculators/A&P                         This is a [id number] 31 y.o. female who tested positive for pregnancy during her visit. She is hypertensive in triage. Patient states that she has hypertension at baseline but she is not on any medication. Instructed to follow up with OBGYN and PCP for further management of blood pressure. Given referral information for local OBGYN. Based on her LMP, she  should be approximately 7 weeks 3 days pregnant. She is medically stable with no concerns for miscarriage or other pregnancy complication.    Final Clinical Impression(s) / ED Diagnoses Final diagnoses:  Less than [redacted] weeks gestation of pregnancy    Rx / DC Orders ED Discharge Orders     None        Claudie Leach, PA-C 06/18/21 1007    Tegeler, Canary Brim, MD 06/18/21 1043

## 2021-06-18 NOTE — Discharge Instructions (Addendum)
You have been seen in the ED for a urine pregnancy test. You have tested positive for being pregnant. You are approximately 7 weeks 3 days pregnant based on the start date of your last menstrual period. Please start following with an OB/GYN. Thank you for allowing [place] to be a part of your care.

## 2021-07-06 ENCOUNTER — Encounter (HOSPITAL_COMMUNITY): Payer: Self-pay

## 2021-07-06 ENCOUNTER — Inpatient Hospital Stay (HOSPITAL_COMMUNITY)
Admission: EM | Admit: 2021-07-06 | Discharge: 2021-07-06 | Disposition: A | Discharge status: Home / Self Care | Payer: Medicaid Other | Attending: Obstetrics & Gynecology | Admitting: Obstetrics & Gynecology

## 2021-07-06 ENCOUNTER — Other Ambulatory Visit: Payer: Self-pay

## 2021-07-06 DIAGNOSIS — H66001 Acute suppurative otitis media without spontaneous rupture of ear drum, right ear: Secondary | ICD-10-CM | POA: Diagnosis not present

## 2021-07-06 DIAGNOSIS — R0981 Nasal congestion: Secondary | ICD-10-CM | POA: Insufficient documentation

## 2021-07-06 DIAGNOSIS — O99891 Other specified diseases and conditions complicating pregnancy: Secondary | ICD-10-CM | POA: Diagnosis not present

## 2021-07-06 DIAGNOSIS — Z3A10 10 weeks gestation of pregnancy: Secondary | ICD-10-CM | POA: Insufficient documentation

## 2021-07-06 DIAGNOSIS — R059 Cough, unspecified: Secondary | ICD-10-CM | POA: Insufficient documentation

## 2021-07-06 DIAGNOSIS — O26891 Other specified pregnancy related conditions, first trimester: Secondary | ICD-10-CM | POA: Diagnosis present

## 2021-07-06 DIAGNOSIS — Z20822 Contact with and (suspected) exposure to covid-19: Secondary | ICD-10-CM | POA: Insufficient documentation

## 2021-07-06 MED ORDER — AMOXICILLIN-POT CLAVULANATE 875-125 MG PO TABS: 1.0000 | ORAL_TABLET | Freq: Two times a day (BID) | ORAL | 0 refills | Status: DC

## 2021-07-06 NOTE — Discharge Instructions (Signed)

## 2021-07-06 NOTE — MAU Provider Note (Signed)
History     CSN: [id number]  Arrival date and time: 07/06/21 1528   Event Date/Time   First Provider Initiated Contact with Patient 07/06/21 1832      Chief Complaint  Patient presents with   URI   Otitis Media   30 y.o. G3P3 @10 .0 wks presenting with right ear pain and "head cold". Reports right ear pain started 1 weeks ago. She has been using "home remedies" but have not helped. Reports frequent ear infections. Endorses nasal congestion and nonproductive cough x5 days. Denies sick contacts. Has not tested for Covid. Has been using Mucinex w/o relief. Denies pregnancy complaints. Has appt to start prenatal care.    OB History     Gravida  4   Para  3   Term      Preterm      AB      Living         SAB      IAB      Ectopic      Multiple      Live Births              History reviewed. No pertinent past medical history.  History reviewed. No pertinent surgical history.  No family history on file.     Allergies: No Known Allergies  No medications prior to admission.    Review of Systems  Constitutional:  Negative for chills and fever.  HENT:  Positive for congestion, ear pain and rhinorrhea. Negative for sore throat.   Respiratory:  Negative for shortness of breath.   Cardiovascular:  Negative for chest pain.  Gastrointestinal:  Negative for abdominal pain.  Genitourinary:  Negative for vaginal bleeding.  Musculoskeletal:  Negative for myalgias.  Neurological:  Negative for headaches.  Physical Exam   Blood pressure (!) 151/78, pulse 92, temperature 98.4 F (36.9 C), temperature source Oral, resp. rate 20, height 5\' 3"  (1.6 m), weight 126.6 kg, last menstrual period 04/27/2021, SpO2 100 %. Patient Vitals for the past 24 hrs:  BP Temp Temp src Pulse Resp SpO2 Height Weight  07/06/21 1817 (!) 151/78 98.4 F (36.9 C) Oral 92 20 100 % 5\' 3"  (1.6 m) 126.6 kg  07/06/21 1815 -- -- -- -- -- 100 % -- --  07/06/21 1534 (!) 146/77 98.3 F (36.8 C)  Oral 97 18 100 % -- --   Physical Exam Vitals and nursing note reviewed.  Constitutional:      General: She is not in acute distress.    Appearance: Normal appearance.  HENT:     Head: Normocephalic and atraumatic.     Right Ear: Ear canal and external ear normal. Drainage and tenderness present. Tympanic membrane is bulging.     Left Ear: Tympanic membrane, ear canal and external ear normal.     Mouth/Throat:     Lips: Pink.     Mouth: Mucous membranes are moist.     Pharynx: Oropharynx is clear. Uvula midline. No oropharyngeal exudate or posterior oropharyngeal erythema.  Cardiovascular:     Rate and Rhythm: Normal rate and regular rhythm.     Heart sounds: Normal heart sounds.  Pulmonary:     Effort: Pulmonary effort is normal. No respiratory distress.     Breath sounds: Normal breath sounds. No stridor. No wheezing, rhonchi or rales.  Musculoskeletal:        General: Normal range of motion.  Skin:    General: Skin is warm and dry.  Neurological:  General: No focal deficit present.     Mental Status: She is alert and oriented to person, place, and time.  Psychiatric:        Mood and Affect: Mood normal.        Behavior: Behavior normal.   No results found for this or any previous visit (from the past 24 hour(s)).  MAU Course  Procedures  MDM Labs ordered. Covid pending. Stable for discharge home.   Assessment and Plan   1. [redacted] weeks gestation of pregnancy   2. Non-recurrent acute suppurative otitis media of right ear without spontaneous rupture of tympanic membrane    Discharge home Follow up at [facility] as scheduled Return precautions OTC meds list provided  Allergies as of 07/06/2021   No Known Allergies      Medication List     TAKE these medications    amoxicillin-clavulanate 875-125 MG tablet Commonly known as: AUGMENTIN Take 1 tablet by mouth every 12 (twelve) hours.       Donette Larry, CNM 07/06/2021, 7:00 PM

## 2021-07-06 NOTE — ED Provider Notes (Signed)
Emergency Medicine Provider Triage Evaluation Note  Leslie Ali , a 30 y.o. female  was evaluated in triage.  Pt complains of Body aches, ear pain, nasal congestion.  She reports cough with out shortness of breath.  This has been ongoing for 4-5 days. She decided to come in today as she has been taking medication with out it helping. She reports that her left ear hurts with movement and touch.  She hasn't had URI symptoms with ear issues before.   G4P3 First day of LMP was July 1st.   No pelvic pain, spotting or bleeding.   Review of Systems  Positive: Body aches, ear pain (left), nasal congestion, cough Negative: Fever  Physical Exam  BP (!) 146/77 (BP Location: Left Arm)   Pulse 97   Temp 98.3 F (36.8 C) (Oral)   Resp 18   SpO2 100%  Gen:   Awake, no distress   Resp:  Normal effort  MSK:   Moves extremities without difficulty  Other:  Left ear appears swollen.   Medical Decision Making  Medically screening exam initiated at 5:18 PM.  Appropriate orders placed.  Terie Lear was informed that the remainder of the evaluation will be completed by another provider, this initial triage assessment does not replace that evaluation, and the importance of remaining in the ED until their evaluation is complete.  This [place], MAU APP who will accept patient in transfer.  Note: Portions of this report may have been transcribed using voice recognition software. Every effort was made to ensure accuracy; however, inadvertent computerized transcription errors may be present    Cristina Gong, PA-C 07/06/21 1729    Charlynne Pander, MD 07/06/21 [phone]

## 2021-07-06 NOTE — MAU Note (Signed)
Came in for a bad head cold and Rt ear infection.  Her BP was high, so they sent her over here. Pt is pregnant. (Congested, drainage, dry cough, denies fever) cold x 5 days, ear pain for a wk. Has not started [facility] yet, first appt is 9/27.  No abd pain, no vag bleeding.

## 2021-07-06 NOTE — ED Triage Notes (Signed)
Pt c/o cold-like symptoms- HA, runny nose, bodyaches, "ear infection," 4-5 days. Pt vaccinated against covid but not boosted, unknown sick contact. Mucinex, allergy pills, robitussin not helpful

## 2021-07-07 ENCOUNTER — Telehealth: Payer: Self-pay | Admitting: Advanced Practice Midwife

## 2021-07-07 LAB — SARS CORONAVIRUS 2 (TAT 6-24 HRS): SARS Coronavirus 2: NEGATIVE

## 2021-07-07 NOTE — Telephone Encounter (Signed)
VLTCB  Clayton Bibles, MSN, CNM Certified Nurse Midwife, [employer] for [employer], [facility] 07/07/21 11:57 AM

## 2021-07-07 NOTE — Telephone Encounter (Signed)
Patient returned my call. Identity verified. Reviewed negative COVID result. Patient denies questions or concerns at end of call.  Clayton Bibles, MSN, CNM Certified Nurse Midwife, [employer] for [employer], [facility] 07/07/21 1:32 PM

## 2021-07-09 ENCOUNTER — Ambulatory Visit (INDEPENDENT_AMBULATORY_CARE_PROVIDER_SITE_OTHER): Payer: Self-pay | Admitting: Surgery

## 2021-07-11 ENCOUNTER — Encounter (HOSPITAL_COMMUNITY): Payer: Self-pay

## 2021-07-11 ENCOUNTER — Other Ambulatory Visit: Payer: Self-pay

## 2021-07-11 ENCOUNTER — Ambulatory Visit (HOSPITAL_COMMUNITY)
Admission: EM | Admit: 2021-07-11 | Discharge: 2021-07-11 | Disposition: A | Discharge status: Home / Self Care | Payer: Medicaid Other | Attending: Physician Assistant | Admitting: Physician Assistant

## 2021-07-11 DIAGNOSIS — H60502 Unspecified acute noninfective otitis externa, left ear: Secondary | ICD-10-CM

## 2021-07-11 DIAGNOSIS — H9201 Otalgia, right ear: Secondary | ICD-10-CM

## 2021-07-11 DIAGNOSIS — O99891 Other specified diseases and conditions complicating pregnancy: Secondary | ICD-10-CM

## 2021-07-11 DIAGNOSIS — H9202 Otalgia, left ear: Secondary | ICD-10-CM | POA: Diagnosis not present

## 2021-07-11 DIAGNOSIS — O99511 Diseases of the respiratory system complicating pregnancy, first trimester: Secondary | ICD-10-CM

## 2021-07-11 DIAGNOSIS — Z3A10 10 weeks gestation of pregnancy: Secondary | ICD-10-CM

## 2021-07-11 DIAGNOSIS — J329 Chronic sinusitis, unspecified: Secondary | ICD-10-CM

## 2021-07-11 MED ORDER — OFLOXACIN 0.3 % OT SOLN: 5.0000 [drp] | Freq: Every day | OTIC | 0 refills | Status: DC

## 2021-07-11 NOTE — ED Provider Notes (Signed)
[facility]    CSN: [id number] Arrival date & time: 07/11/21  1047      History   Chief Complaint Chief Complaint  Patient presents with   Otalgia    HPI Leslie Ali is a 30 y.o. female.   Patient presents today with a 1.5-week history of URI symptoms.  She was seen in the emergency room due to worsening otalgia on 07/06/2021 no started on Augmentin.  She reports this provided some relief of symptoms but she continues to have significant otalgia particularly on the left prompting reevaluation today.  She reports ear pain is rated 8 on a 0-10 pain scale in the left and 6 on the right, described as a intense aching with periodic shooting pains, no aggravating relieving factors identified.  She has tried Tylenol without improvement of symptoms.  She does report associated cough and congestion to these have improved.  Denies any fever, chest pain, shortness of breath, nausea, vomiting.  She has taken allergy medication and Robitussin without improvement of symptoms.  Denies additional antibiotic use.  Denies any known sick contacts.  Reports she is up-to-date on COVID-19 vaccination.  Denies any recent swimming or airplane travel.  She is currently pregnant.   History reviewed. No pertinent past medical history.  There are no problems to display for this patient.   History reviewed. No pertinent surgical history.  OB History     Gravida  4   Para  3   Term      Preterm      AB      Living         SAB      IAB      Ectopic      Multiple      Live Births               Home Medications    Prior to Admission medications   Medication Sig Start Date End Date Taking? Authorizing Provider  ofloxacin (FLOXIN) 0.3 % OTIC solution Place 5 drops into the left ear daily. 07/11/21  Yes Pocahontas Cohenour K, PA-C  amoxicillin-clavulanate (AUGMENTIN) 875-125 MG tablet Take 1 tablet by mouth every 12 (twelve) hours. 07/06/21   Donette Larry, CNM    Family  History History reviewed. No pertinent family history.  Social History Social History   Tobacco Use   Smoking status: Never   Smokeless tobacco: Never  Substance Use Topics   Alcohol use: Never   Drug use: Never     Allergies   Patient has no known allergies.   Review of Systems Review of Systems  Constitutional:  Negative for activity change, appetite change, fatigue and fever.  HENT:  Positive for congestion and ear pain. Negative for sinus pressure, sneezing and sore throat.   Respiratory:  Positive for cough. Negative for shortness of breath.   Cardiovascular:  Negative for chest pain.  Gastrointestinal:  Negative for abdominal pain, diarrhea, nausea and vomiting.  Musculoskeletal:  Negative for arthralgias and myalgias.  Neurological:  Negative for dizziness, light-headedness and headaches.    Physical Exam Triage Vital Signs ED Triage Vitals  Enc Vitals Group     BP 07/11/21 1243 122/72     Pulse Rate 07/11/21 1243 89     Resp 07/11/21 1243 18     Temp 07/11/21 1243 97.8 F (36.6 C)     Temp Source 07/11/21 1243 Oral     SpO2 07/11/21 1243 98 %     Weight --  Height --      Head Circumference --      Peak Flow --      Pain Score 07/11/21 1242 8     Pain Loc --      Pain Edu? --      Excl. in GC? --    No data found.  Updated Vital Signs BP 122/72 (BP Location: Left Arm)   Pulse 89   Temp 97.8 F (36.6 C) (Oral)   Resp 18   LMP 04/27/2021   SpO2 98%   Visual Acuity Right Eye Distance:   Left Eye Distance:   Bilateral Distance:    Right Eye Near:   Left Eye Near:    Bilateral Near:     Physical Exam Vitals reviewed.  Constitutional:      General: She is awake. She is not in acute distress.    Appearance: Normal appearance. She is well-developed. She is not ill-appearing.     Comments: Very pleasant female appears stated age no acute distress sitting comfortably in exam room  HENT:     Head: Normocephalic and atraumatic.     Right  Ear: Tympanic membrane, ear canal and external ear normal. Tympanic membrane is not erythematous or bulging.     Left Ear: External ear normal. Swelling and tenderness present. Tympanic membrane is erythematous. Tympanic membrane is not bulging.     Ears:     Comments: Left ear: Significant erythema and edema of external auditory canal.  TM is intact with erythema without bulging.  Tenderness palpation of tragus pain with manipulation of external ear.    Nose:     Right Sinus: No maxillary sinus tenderness or frontal sinus tenderness.     Left Sinus: No maxillary sinus tenderness or frontal sinus tenderness.     Mouth/Throat:     Pharynx: Uvula midline. No oropharyngeal exudate or posterior oropharyngeal erythema.  Cardiovascular:     Rate and Rhythm: Normal rate and regular rhythm.     Heart sounds: Normal heart sounds, S1 normal and S2 normal. No murmur heard. Pulmonary:     Effort: Pulmonary effort is normal.     Breath sounds: Normal breath sounds. No wheezing, rhonchi or rales.     Comments: Clear to auscultation bilaterally Lymphadenopathy:     Head:     Right side of head: No submental, submandibular or tonsillar adenopathy.     Left side of head: No submental, submandibular or tonsillar adenopathy.     Cervical: No cervical adenopathy.  Psychiatric:        Behavior: Behavior is cooperative.     UC Treatments / Results  Labs (all labs ordered are listed, but only abnormal results are displayed) Labs Reviewed - No data to display  EKG   Radiology No results found.  Procedures Procedures (including critical care time)  Medications Ordered in UC Medications - No data to display  Initial Impression / Assessment and Plan / UC Course  I have reviewed the triage vital signs and the nursing notes.  Pertinent labs & imaging results that were available during my care of the patient were reviewed by me and considered in my medical decision making (see chart for details).       Patient was encouraged to complete course of Augmentin as previously prescribed.  Concern for otitis externa given physical exam findings patient was started on ofloxacin otic drops.  She is currently [redacted] weeks pregnant but given low systemic absorption of this medication it is  considered safe in pregnancy.  She was encouraged to use Tylenol for pain relief.  Discussed that if symptoms or not improving she should follow-up with an ENT and was given contact information for specialist.  Recommend she follow-up with her OB/GYN and at least inform them of medication changes from today.  Discussed alarm symptoms that warrant emergent evaluation.  Strict return precautions given to which she expressed understanding.  Final Clinical Impressions(s) / UC Diagnoses   Final diagnoses:  Acute otitis externa of left ear, unspecified type  Otalgia of left ear  Otalgia of right ear  Sinobronchitis  [redacted] weeks gestation of pregnancy     Discharge Instructions      Continue antibiotic as prescribed.  Use drops in your left ear.  Keep your ear facing upward for several minutes after applying drops to allow this to completely penetrate the ear canal.  You can use Tylenol for breakthrough pain.  If your symptoms are improving follow-up with ENT as we discussed.  If anything worsens please return for reevaluation.     ED Prescriptions     Medication Sig Dispense Auth. Provider   ofloxacin (FLOXIN) 0.3 % OTIC solution Place 5 drops into the left ear daily. 5 mL Kenni Newton K, PA-C      PDMP not reviewed this encounter.   Jeani Hawking, PA-C 07/11/21 1352

## 2021-07-11 NOTE — ED Triage Notes (Signed)
Pt reports bilateral ear pain x 1 week. Left ar pain is worse.

## 2021-07-11 NOTE — Discharge Instructions (Addendum)
Continue antibiotic as prescribed.  Use drops in your left ear.  Keep your ear facing upward for several minutes after applying drops to allow this to completely penetrate the ear canal.  You can use Tylenol for breakthrough pain.  If your symptoms are improving follow-up with ENT as we discussed.  If anything worsens please return for reevaluation.

## 2021-07-17 ENCOUNTER — Telehealth (INDEPENDENT_AMBULATORY_CARE_PROVIDER_SITE_OTHER): Payer: Medicaid Other

## 2021-07-17 DIAGNOSIS — O09899 Supervision of other high risk pregnancies, unspecified trimester: Secondary | ICD-10-CM | POA: Insufficient documentation

## 2021-07-17 DIAGNOSIS — Z1331 Encounter for screening for depression: Secondary | ICD-10-CM

## 2021-07-17 DIAGNOSIS — O09299 Supervision of pregnancy with other poor reproductive or obstetric history, unspecified trimester: Secondary | ICD-10-CM

## 2021-07-17 DIAGNOSIS — Z3A00 Weeks of gestation of pregnancy not specified: Secondary | ICD-10-CM

## 2021-07-17 DIAGNOSIS — Z136 Encounter for screening for cardiovascular disorders: Secondary | ICD-10-CM

## 2021-07-17 DIAGNOSIS — O1490 Unspecified pre-eclampsia, unspecified trimester: Secondary | ICD-10-CM | POA: Insufficient documentation

## 2021-07-17 DIAGNOSIS — Z8759 Personal history of other complications of pregnancy, childbirth and the puerperium: Secondary | ICD-10-CM | POA: Insufficient documentation

## 2021-07-17 DIAGNOSIS — O1493 Unspecified pre-eclampsia, third trimester: Secondary | ICD-10-CM

## 2021-07-17 DIAGNOSIS — O0990 Supervision of high risk pregnancy, unspecified, unspecified trimester: Secondary | ICD-10-CM | POA: Insufficient documentation

## 2021-07-17 MED ORDER — BLOOD PRESSURE MONITORING DEVI: 1.0000 | 0 refills | Status: DC

## 2021-07-17 MED ORDER — PRENATAL PLUS 27-1 MG PO TABS: 1.0000 | ORAL_TABLET | Freq: Every day | ORAL | 11 refills | Status: DC

## 2021-07-17 NOTE — Progress Notes (Signed)
Pt advised that she was taking Meds for Depression previously, wanted to get back on them.

## 2021-07-17 NOTE — Progress Notes (Signed)
New OB Intake  I connected with  Corena Pilgrim on 07/17/21 at 10:15 AM EDT by MyChart Video Visit and verified that I am speaking with the correct person using two identifiers. Nurse is located at [facility] and pt is located at home.  I discussed the limitations, risks, security and privacy concerns of performing an evaluation and management service by telephone and the availability of in person appointments. I also discussed with the patient that there may be a patient responsible charge related to this service. The patient expressed understanding and agreed to proceed.  I explained I am completing New OB Intake today. We discussed her EDD of 02/01/22 that is based on LMP of 04/27/21. Pt is G4/P1. I reviewed her allergies, medications, Medical/Surgical/OB history, and appropriate screenings. I informed her of [facility] services. Based on history, this is a/an  pregnancy complicated by pre-eclampsia .   There are no problems to display for this patient.   Concerns addressed today  Delivery Plans:  Plans to deliver at [facility].   MyChart/Babyscripts MyChart access verified. I explained pt will have some visits in office and some virtually. Babyscripts instructions given and order placed. Patient verifies receipt of registration text/e-mail. Account successfully created and app downloaded.  Blood Pressure Cuff  Blood pressure cuff ordered for patient to pick-up from [employer]. Explained after first prenatal appt pt will check weekly and document in Babyscripts.  Weight scale: Patient    have weight scale. Weight scale ordered for patient to pick up form [facility].   Anatomy US Explained first scheduled [place] will be around 19 weeks. Anatomy US scheduled for 09/07/21 at 10:45a. Pt notified to arrive at 10:30p.  Labs Discussed Avelina Laine genetic screening with patient. Would like both Panorama and Horizon drawn at new OB visit. Routine prenatal labs needed.  Covid Vaccine Patient has covid  vaccine.   Mother/ Baby Dyad Candidate?    If yes, offer as possibility  Informed patient of [facility] website  and placed link in her AVS.   Social Determinants of Health Food Insecurity: Patient denies food insecurity. WIC Referral: Patient is interested in referral to [facility].  Transportation: Patient denies transportation needs. Childcare: Discussed no children allowed at ultrasound appointments. Offered childcare services; patient declines childcare services at this time.  Send link to Pregnancy Navigators   Placed OB Box on problem list and updated  First visit review I reviewed new OB appt with pt. I explained she will have a pelvic exam, ob bloodwork with genetic screening, and PAP smear. Explained pt will be seen by Nolene Bernheim, NP at first visit; encounter routed to appropriate provider. Explained that patient will be seen by pregnancy navigator following visit with provider. [facility] information placed in AVS.   Henrietta Dine, CMA 07/17/2021  11:07 AM

## 2021-07-17 NOTE — BH Specialist Note (Signed)
Integrated Behavioral Health via Telemedicine Visit  07/17/2021 [name] [id number]  Number of Integrated Behavioral Health visits: 1 Session Start time: 10:46  Session End time: 11:13 Total time:  27  Referring Provider: Nolene Bernheim, NP Patient/Family location: Home [facility] Provider location: Center for [facility]  All persons participating in visit: Patient Leslie Ali and [facility] Jenavieve Freda  Types of Service: Individual psychotherapy and Video visit  I connected with Corena Pilgrim and/or Hiram Gash  n/a  via  Telephone or Video Enabled Telemedicine Application  (Video is Caregility application) and verified that I am speaking with the correct person using two identifiers. Discussed confidentiality: Yes   I discussed the limitations of telemedicine and the availability of in person appointments.  Discussed there is a possibility of technology failure and discussed alternative modes of communication if that failure occurs.  I discussed that engaging in this telemedicine visit, they consent to the provision of behavioral healthcare and the services will be billed under their insurance.  Patient and/or legal guardian expressed understanding and consented to Telemedicine visit: Yes   Presenting Concerns: Patient and/or family reports the following symptoms/concerns: Depression/anxiety while not taking Zoloft for about 2 months; pt requests to start back on Zoloft and goal is healthy pregnancy with well-managed blood pressure. Duration of problem: Increase in current pregnancy; ongoing; Severity of problem: moderate  Patient and/or Family's Strengths/Protective Factors: Concrete supports in place (healthy food, safe environments, etc.) and Sense of purpose  Goals Addressed: Patient will:  Reduce symptoms of: anxiety, depression, and stress   Increase knowledge and/or ability of: healthy habits   Demonstrate  ability to: Increase healthy adjustment to current life circumstances  Progress towards Goals: Ongoing  Interventions: Interventions utilized:  Medication Monitoring, Psychoeducation and/or Health Education, and Supportive Reflection Standardized Assessments completed:  PHQ9/GAD7   Patient and/or Family Response: Pt agrees with treatment plan  Assessment: Patient currently experiencing 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   Patient may benefit from psychoeducation and brief therapeutic interventions regarding coping with symptoms of depression, anxiety .  Plan: Follow up with behavioral health clinician on : Two weeks Behavioral recommendations:  -Continue taking prenatal vitamin daily -Begin taking Zoloft as prescribed  -Consider viewing virtual tour of [facility] at [email or web address]  -Continue plan to contact pharmacy about BP supplies Referral(s): Integrated [employer] (In Clinic)  I discussed the assessment and treatment plan with the patient and/or parent/guardian. They were provided an opportunity to ask questions and all were answered. They agreed with the plan and demonstrated an understanding of the instructions.   They were advised to call back or seek an in-person evaluation if the symptoms worsen or if the condition fails to improve as anticipated.  Rae Lips, LCSW  Depression screen [facility] 2/9 07/17/2021  Decreased Interest 1  Down, Depressed, Hopeless 2  PHQ - 2 Score 3  Altered sleeping 2  Tired, decreased energy 2  Change in appetite 1  Feeling bad or failure about yourself  2  Trouble concentrating 1  Moving slowly or fidgety/restless 1  Suicidal thoughts 0  PHQ-9 Score 12   GAD 7 : Generalized Anxiety Score 07/17/2021  Nervous, Anxious, on Edge 1  Control/stop worrying 1  Worry too much - different things 1  Trouble relaxing 1  Restless 0  Easily annoyed or irritable 1  Afraid - awful might  happen 1  Total GAD 7 Score 6

## 2021-07-19 ENCOUNTER — Encounter (INDEPENDENT_AMBULATORY_CARE_PROVIDER_SITE_OTHER): Payer: Self-pay | Admitting: Surgery

## 2021-07-20 ENCOUNTER — Ambulatory Visit (INDEPENDENT_AMBULATORY_CARE_PROVIDER_SITE_OTHER): Payer: Medicaid Other | Admitting: Clinical

## 2021-07-20 ENCOUNTER — Other Ambulatory Visit: Payer: Self-pay | Admitting: Nurse Practitioner

## 2021-07-20 ENCOUNTER — Encounter: Payer: Self-pay | Admitting: Nurse Practitioner

## 2021-07-20 DIAGNOSIS — F419 Anxiety disorder, unspecified: Secondary | ICD-10-CM | POA: Insufficient documentation

## 2021-07-20 DIAGNOSIS — F4323 Adjustment disorder with mixed anxiety and depressed mood: Secondary | ICD-10-CM

## 2021-07-20 DIAGNOSIS — F32A Depression, unspecified: Secondary | ICD-10-CM | POA: Insufficient documentation

## 2021-07-20 MED ORDER — SERTRALINE HCL 25 MG PO TABS: 25.0000 mg | ORAL_TABLET | Freq: Every day | ORAL | 0 refills | Status: DC

## 2021-07-20 MED ORDER — SERTRALINE HCL 50 MG PO TABS: 50.0000 mg | ORAL_TABLET | Freq: Every day | ORAL | 0 refills | Status: DC

## 2021-07-20 NOTE — Progress Notes (Signed)
Leslie Ali 30 y.o. [redacted]w[redacted]d   Saw Behavioral Health and wants to restart Zoloft that she has taken before.   Previously took Zoloft 50 mg.  Prescribed 7 days of zoloft 25 mg to follow with 30 days Zoloft 50 mg.  Nolene Bernheim, RN, MSN, NP-BC Nurse Practitioner, [facility] for [employer], [facility] 07/20/2021 1:23 PM

## 2021-07-23 NOTE — BH Specialist Note (Signed)
Pt did not arrive to video visit and did not answer the phone; Left HIPPA-compliant message to call back Jaisa Defino from [facility] at  [phone] (Janiene Aarons's office).  ?; left MyChart message for patient.  ? ?

## 2021-07-24 ENCOUNTER — Other Ambulatory Visit: Payer: Self-pay

## 2021-07-24 ENCOUNTER — Ambulatory Visit (INDEPENDENT_AMBULATORY_CARE_PROVIDER_SITE_OTHER): Payer: Medicaid Other | Admitting: Nurse Practitioner

## 2021-07-24 VITALS — BP 156/85 | HR 80 | Wt 278.3 lb

## 2021-07-24 DIAGNOSIS — O0990 Supervision of high risk pregnancy, unspecified, unspecified trimester: Secondary | ICD-10-CM

## 2021-07-24 DIAGNOSIS — O09899 Supervision of other high risk pregnancies, unspecified trimester: Secondary | ICD-10-CM

## 2021-07-24 DIAGNOSIS — Z6841 Body Mass Index (BMI) 40.0 and over, adult: Secondary | ICD-10-CM | POA: Insufficient documentation

## 2021-07-24 DIAGNOSIS — Z3A12 12 weeks gestation of pregnancy: Secondary | ICD-10-CM

## 2021-07-24 DIAGNOSIS — F32A Depression, unspecified: Secondary | ICD-10-CM

## 2021-07-24 DIAGNOSIS — F419 Anxiety disorder, unspecified: Secondary | ICD-10-CM

## 2021-07-24 DIAGNOSIS — Z8759 Personal history of other complications of pregnancy, childbirth and the puerperium: Secondary | ICD-10-CM

## 2021-07-24 DIAGNOSIS — J302 Other seasonal allergic rhinitis: Secondary | ICD-10-CM | POA: Insufficient documentation

## 2021-07-24 NOTE — Patient Instructions (Signed)

## 2021-07-24 NOTE — Progress Notes (Signed)
Subjective:   Leslie Ali is a 30 y.o. [id number] at 19w4dby LMP being seen today for her first obstetrical visit.  Her obstetrical history is significant for  chronic hypertension, obesity, history of pre-eclampsia with preterm birth . Patient does intend to breast feed. Pregnancy history fully reviewed.  Patient reports no complaints.  HISTORY: OB History  Gravida Para Term Preterm AB Living  _0 0 3  SAB IAB Ectopic Multiple Live Births  0 0 0 0 3    # Outcome Date GA Lbr Len/2nd Weight Sex Delivery Anes PTL Lv  4 Current           3 Preterm 12/20/20 330w0d M CS-Unspec  Y LIV     Complications: Preeclampsia  2 Term 05/07/19    F CS-Unspec  N LIV  1 Term 08/07/15    M CS-Unspec  N LIV   Past Medical History:  Diagnosis Date   Medical history non-contributory    Past Surgical History:  Procedure Laterality Date   CESAREAN SECTION     No family history on file. Social History   Tobacco Use   Smoking status: Never   Smokeless tobacco: Never  Substance Use Topics   Alcohol use: Never   Drug use: Never   No Known Allergies Current Outpatient Medications on File Prior to Visit  Medication Sig Dispense Refill   Blood Pressure Monitoring DEVI 1 each by Does not apply route once a week. 1 each 0   prenatal vitamin w/FE, FA (PRENATAL 1 + 1) 27-1 MG TABS tablet Take 1 tablet by mouth daily at 12 noon. 30 tablet 11   sertraline (ZOLOFT) 25 MG tablet Take 1 tablet (25 mg total) by mouth daily for 7 days. 7 tablet 0   sertraline (ZOLOFT) 50 MG tablet Take 1 tablet (50 mg total) by mouth daily. Begin after taking Zoloft 25 mg for 7 days. 30 tablet 0   No current facility-administered medications on file prior to visit.     Exam   Vitals:   07/24/21 0912  BP: (!) 156/85  Pulse: 80  Weight: 278 lb 4.8 oz (126.2 kg)   Fetal Heart Rate (bpm): 153  Uterus:     Pelvic Exam: Perineum: Deferred - declines pap today - states she had with last pregnancy - chart  routed for ROI to be sent for her records.   Vulva:    Vagina:     Cervix:    Adnexa:    Bony Pelvis:   System: General: well-developed, well-nourished female in no acute distress   Breast:  normal appearance, no masses or tenderness   Skin: normal coloration and turgor, no rashes   Neurologic: oriented, normal, negative, normal mood   Extremities: normal strength, tone, and muscle mass, ROM of all joints is normal   HEENT extraocular movement intact and sclera clear, anicteric   Mouth/Teeth mucous membranes moist, pharynx normal without lesions and dental hygiene good   Neck supple and no masses, normal thyroid   Cardiovascular: regular rate and rhythm   Respiratory:  no respiratory distress, normal breath sounds   Abdomen: soft, non-tender; no masses,  no organomegaly     Assessment:   Pregnancy: [id number] Active Problem List   Diagnosis Date Noted   Anxiety and depression 07/20/2021   History of pre-eclampsia 07/17/2021   Supervision of high risk pregnancy, antepartum 07/17/2021   Short interval between pregnancies complicating pregnancy, antepartum 07/17/2021  Plan:  1. Supervision of high risk pregnancy, antepartum Previous birth was in [place]  and baby is now 46 months of age. Has had 3 previous C/S births  - CBC/D/Plt+RPR+Rh+ABO+RubIgG... - Genetic Screening - Hemoglobin A1c - Culture, OB Urine - [facility] AMB BABYSCRIPTS SCHEDULE OPTIMIZATION  2. History of pre-eclampsia Elevated BP during last birth, had resolved at postpartum visit and was not on medication prior to this pregnancy Two elevated BP readings during this pregnancy - will consider as chronic hypertension Consult with Dr. Damita Dunnings - elevated BP today but will observe until next visit Discussed getting BP cuff and taking daily and entering BP into Babyscripts  - Comp Met (CMET) - Protein / creatinine ratio, urine  3. Short interval between pregnancies complicating pregnancy, antepartum Is  interested in some type of contraception - unsure at this time - possibly will want BTL?  4. BMI 45.0-49.9, adult ([place])  5. Seasonal allergies Stuffy today - given list of meds safe in pregnancy Advised claritin or zyrtec for symptoms  6.  Anxiety and depression - currently seeing [facility] health provider in the office - even before her first OB visit - restarted on Zoloft which she has not taken for awhile - to begin medication today - 25 gm for 7 days, the 50 mg daily  Initial labs drawn. Continue prenatal vitamins. Genetic Screening discussed, NIPS: ordered. Ultrasound discussed; fetal anatomic survey: ordered. Problem list reviewed and updated. The nature of [place] with multiple MDs and other Advanced Practice Providers was explained to patient; also emphasized that residents, students are part of our team. Routine obstetric precautions reviewed. Return in about 4 weeks (around 08/21/2021) for [facility] - Eat in person, MD please.  Total face-to-face time with patient: 40 minutes.  Over 50% of encounter was spent on counseling and coordination of care.     Earlie Server, FNP Family Nurse Practitioner, [facility] for [employer], [place] [place] [facility] 07/24/2021 10:03 AM

## 2021-07-25 ENCOUNTER — Telehealth: Payer: Self-pay

## 2021-07-25 LAB — COMPREHENSIVE METABOLIC PANEL
ALT: 8 IU/L (ref 0–32)
AST: 11 IU/L (ref 0–40)
Albumin/Globulin Ratio: 1.4 (ref 1.2–2.2)
Albumin: 4.1 g/dL (ref 3.8–4.8)
Alkaline Phosphatase: 82 IU/L (ref 44–121)
BUN/Creatinine Ratio: 13 (ref 9–23)
BUN: 8 mg/dL (ref 6–20)
Bilirubin Total: 0.3 mg/dL (ref 0.0–1.2)
CO2: 20 mmol/L (ref 20–29)
Calcium: 9.1 mg/dL (ref 8.7–10.2)
Chloride: 100 mmol/L (ref 96–106)
Creatinine, Ser: 0.61 mg/dL (ref 0.57–1.00)
Globulin, Total: 2.9 g/dL (ref 1.5–4.5)
Glucose: 74 mg/dL (ref 70–99)
Potassium: 4.3 mmol/L (ref 3.5–5.2)
Sodium: 135 mmol/L (ref 134–144)
Total Protein: 7 g/dL (ref 6.0–8.5)
eGFR: 122 mL/min/{1.73_m2} (ref >59)

## 2021-07-25 LAB — CBC/D/PLT+RPR+RH+ABO+RUBIGG...
Antibody Screen: NEGATIVE
Basophils Absolute: 0 10*3/uL (ref 0.0–0.2)
Basos: 0 %
EOS (ABSOLUTE): 0.5 10*3/uL — ABNORMAL HIGH (ref 0.0–0.4)
Eos: 5 %
HCV Ab: <0.1 s/co ratio (ref 0.0–0.9)
HIV Screen 4th Generation wRfx: NONREACTIVE
Hematocrit: 38.6 % (ref 34.0–46.6)
Hemoglobin: 12.1 g/dL (ref 11.1–15.9)
Hepatitis B Surface Ag: NEGATIVE
Immature Grans (Abs): 0 10*3/uL (ref 0.0–0.1)
Immature Granulocytes: 0 %
Lymphocytes Absolute: 1.8 10*3/uL (ref 0.7–3.1)
Lymphs: 21 %
MCH: 22.9 pg — ABNORMAL LOW (ref 26.6–33.0)
MCHC: 31.3 g/dL — ABNORMAL LOW (ref 31.5–35.7)
MCV: 73 fL — ABNORMAL LOW (ref 79–97)
Monocytes Absolute: 0.5 10*3/uL (ref 0.1–0.9)
Monocytes: 6 %
Neutrophils Absolute: 5.8 10*3/uL (ref 1.4–7.0)
Neutrophils: 68 %
Platelets: 370 10*3/uL (ref 150–450)
RBC: 5.29 x10E6/uL — ABNORMAL HIGH (ref 3.77–5.28)
RDW: 16 % — ABNORMAL HIGH (ref 11.7–15.4)
RPR Ser Ql: NONREACTIVE
Rh Factor: POSITIVE
Rubella Antibodies, IGG: 1.14 index (ref >0.99)
WBC: 8.6 10*3/uL (ref 3.4–10.8)

## 2021-07-25 LAB — PROTEIN / CREATININE RATIO, URINE
Creatinine, Urine: 105.1 mg/dL
Protein, Ur: 9.1 mg/dL
Protein/Creat Ratio: 87 mg/g creat (ref 0–200)

## 2021-07-25 LAB — HCV INTERPRETATION

## 2021-07-25 LAB — HEMOGLOBIN A1C
Est. average glucose Bld gHb Est-mCnc: 111 mg/dL
Hgb A1c MFr Bld: 5.5 % (ref 4.8–5.6)

## 2021-07-25 NOTE — Telephone Encounter (Addendum)
Patient called and talked with front office. Pt states her blood pressure cuff is not working. Picked this up from [employer] yesterday; encouraged pt to return this to [employer] for assistance. Pt states [facility] explained to her that they need to order this prenatal vitamin. Explained to pt that I will call the pharmacy to follow up, will request that pharmacy call patient when medication is available.   Called [facility] on 07/26/21 AM, pharmacy staff reports prenatal will arrive at pharmacy today and they will contact the patient when it arrives.

## 2021-07-26 LAB — CULTURE, OB URINE

## 2021-07-26 LAB — URINE CULTURE, OB REFLEX

## 2021-07-31 ENCOUNTER — Ambulatory Visit (INDEPENDENT_AMBULATORY_CARE_PROVIDER_SITE_OTHER): Payer: Medicaid Other | Admitting: Surgery

## 2021-07-31 ENCOUNTER — Encounter (INDEPENDENT_AMBULATORY_CARE_PROVIDER_SITE_OTHER): Payer: Self-pay

## 2021-08-03 ENCOUNTER — Ambulatory Visit: Payer: Medicaid Other | Admitting: Clinical

## 2021-08-03 DIAGNOSIS — Z91199 Patient's noncompliance with other medical treatment and regimen due to unspecified reason: Secondary | ICD-10-CM

## 2021-08-08 ENCOUNTER — Telehealth: Payer: Self-pay

## 2021-08-08 NOTE — Telephone Encounter (Signed)
Elevated BP alert received from Babyscripts. BP 177/99. No symptoms reported.   Called pt. Reports she was taking labetalol 3 times a day during prior pregnancy and PP period. Reports this was discontinued at 6 wk PP visit because BP was low. Pt reports she has been taking BP at local pharmacy. Pt does have BP cuff from [facility] but she states this is not working, showing "error 1." Pt agreeable to blood pressure check visit with RN, requests morning appt. Scheduled for tomorrow at 10 AM. Requested that patient bring blood pressure cuff to appointment tomorrow.

## 2021-08-09 ENCOUNTER — Ambulatory Visit (INDEPENDENT_AMBULATORY_CARE_PROVIDER_SITE_OTHER): Payer: Medicaid Other

## 2021-08-09 ENCOUNTER — Other Ambulatory Visit: Payer: Self-pay

## 2021-08-09 VITALS — BP 125/83 | HR 71 | Wt 282.0 lb

## 2021-08-09 DIAGNOSIS — Z136 Encounter for screening for cardiovascular disorders: Secondary | ICD-10-CM

## 2021-08-09 DIAGNOSIS — Z8759 Personal history of other complications of pregnancy, childbirth and the puerperium: Secondary | ICD-10-CM

## 2021-08-09 MED ORDER — BLOOD PRESSURE MONITORING DEVI: 1.0000 | 0 refills | Status: DC

## 2021-08-09 NOTE — Progress Notes (Signed)
Pt here today for BP check. Pt states cuff not working. Has with her today. Took BP with her cuff: 147/93  Checked BP again and took with our office machine  BP: 125/83 with using appropriate sized cuff in office New Rx sent with Pharmacy message sent to [facility] for Large cuff for pt. Pt verbalized understanding and will go and pick up new cuff now.  Pt denies headaches, swelling or visual changes.   Judeth Cornfield, RN

## 2021-08-21 ENCOUNTER — Telehealth: Payer: Self-pay

## 2021-08-21 NOTE — Telephone Encounter (Signed)
Call received from Babyscripts with elevated BP of 161/97. No symptoms reported. Pt has OB appt tomorrow AM at 10:55. Will follow up with patient at that time.

## 2021-08-22 ENCOUNTER — Ambulatory Visit (INDEPENDENT_AMBULATORY_CARE_PROVIDER_SITE_OTHER): Payer: Medicaid Other | Admitting: Obstetrics & Gynecology

## 2021-08-22 ENCOUNTER — Other Ambulatory Visit: Payer: Self-pay

## 2021-08-22 VITALS — BP 134/85 | HR 66 | Wt 278.2 lb

## 2021-08-22 DIAGNOSIS — O09899 Supervision of other high risk pregnancies, unspecified trimester: Secondary | ICD-10-CM

## 2021-08-22 DIAGNOSIS — O0990 Supervision of high risk pregnancy, unspecified, unspecified trimester: Secondary | ICD-10-CM | POA: Diagnosis not present

## 2021-08-22 DIAGNOSIS — Z6841 Body Mass Index (BMI) 40.0 and over, adult: Secondary | ICD-10-CM

## 2021-08-22 DIAGNOSIS — Z23 Encounter for immunization: Secondary | ICD-10-CM

## 2021-08-22 DIAGNOSIS — F419 Anxiety disorder, unspecified: Secondary | ICD-10-CM

## 2021-08-22 DIAGNOSIS — F32A Depression, unspecified: Secondary | ICD-10-CM

## 2021-08-22 DIAGNOSIS — Z8759 Personal history of other complications of pregnancy, childbirth and the puerperium: Secondary | ICD-10-CM

## 2021-08-22 MED ORDER — ASPIRIN EC 81 MG PO TBEC
81.0000 mg | DELAYED_RELEASE_TABLET | Freq: Every day | ORAL | 11 refills | Status: DC
Start: 2021-08-22 — End: 2021-10-11

## 2021-08-22 NOTE — Progress Notes (Signed)
   PRENATAL VISIT NOTE  Subjective:  Leslie Ali is a 30 y.o. [phone] at [redacted]w[redacted]d being seen today for ongoing prenatal care.  She is currently monitored for the following issues for this high-risk pregnancy and has History of pre-eclampsia; Supervision of high risk pregnancy, antepartum; Short interval between pregnancies complicating pregnancy, antepartum; Anxiety and depression; BMI 45.0-49.9, adult (HCC); and Seasonal allergies on their problem list.  Patient reports  no pelvic pain, headache, vision changes, or lower extremity edema . She desires to breastfeed after delivery and also would like to discuss options for contraception. Contractions: Not present. Vag. Bleeding: None.  Movement: Absent. Denies leaking of fluid.   The following portions of the patient's history were reviewed and updated as appropriate: allergies, current medications, past family history, past medical history, past social history, past surgical history and problem list.   Objective:   Vitals:   08/22/21 1105  BP: 134/85  Pulse: 66  Weight: 278 lb 3.2 oz (126.2 kg)    Fetal Status:     Movement: Absent     General:  Alert, oriented and cooperative. Patient is in no acute distress.  Skin: Skin is warm and dry. No rash noted.   Cardiovascular: Normal heart rate noted  Respiratory: Normal respiratory effort, no problems with respiration noted  Abdomen: Soft, gravid, appropriate for gestational age.  Pain/Pressure: Absent     Pelvic: Cervical exam deferred        Extremities: Normal range of motion.  Edema: None  Mental Status: Normal mood and affect. Normal behavior. Normal judgment and thought content.   Assessment and Plan:  Pregnancy: [id number] at 105w5d 1. Supervision of high risk pregnancy, antepartum She is doing well with no acute complaints or concerns. Hx of three previous cesarean births and preeclampsia with prior pregnancy. BP tolerable today in office.  -Continue routine prenatal care -Start  ASA 81 mg daily -Flu Vaccine QUAD 36+ mos IM (Fluarix, Quad PF)  Preterm labor symptoms and general obstetric precautions including but not limited to vaginal bleeding, contractions, leaking of fluid and fetal movement were reviewed in detail with the patient. Please refer to After Visit Summary for other counseling recommendations.   No follow-ups on file.  Future Appointments  Date Time Provider Department Center  09/11/2021 10:30 AM [facility] NURSE [facility]  09/11/2021 10:45 AM [facility] US5 [facility]    Val Eagle, Medical Student Attestation of Attending Supervision of Medical Student: Evaluation and management procedures were performed by the medical student under my supervision and collaboration.  I have reviewed the student's note and chart, and I agree with the management and plan.  Scheryl Darter, MD, FACOG Attending Obstetrician & Gynecologist Faculty Practice, [facility]

## 2021-08-24 DIAGNOSIS — O0990 Supervision of high risk pregnancy, unspecified, unspecified trimester: Secondary | ICD-10-CM

## 2021-09-07 ENCOUNTER — Other Ambulatory Visit: Payer: Medicaid Other

## 2021-09-07 ENCOUNTER — Ambulatory Visit: Payer: Medicaid Other

## 2021-09-11 ENCOUNTER — Ambulatory Visit (HOSPITAL_BASED_OUTPATIENT_CLINIC_OR_DEPARTMENT_OTHER): Payer: Medicaid Other

## 2021-09-11 ENCOUNTER — Encounter: Payer: Self-pay | Admitting: *Deleted

## 2021-09-11 ENCOUNTER — Ambulatory Visit: Payer: Medicaid Other | Attending: Nurse Practitioner | Admitting: *Deleted

## 2021-09-11 ENCOUNTER — Other Ambulatory Visit: Payer: Self-pay | Admitting: *Deleted

## 2021-09-11 ENCOUNTER — Other Ambulatory Visit: Payer: Self-pay

## 2021-09-11 VITALS — BP 137/85 | HR 74

## 2021-09-11 DIAGNOSIS — O1493 Unspecified pre-eclampsia, third trimester: Secondary | ICD-10-CM

## 2021-09-11 DIAGNOSIS — Z3A19 19 weeks gestation of pregnancy: Secondary | ICD-10-CM | POA: Insufficient documentation

## 2021-09-11 DIAGNOSIS — O10012 Pre-existing essential hypertension complicating pregnancy, second trimester: Secondary | ICD-10-CM

## 2021-09-11 DIAGNOSIS — O0990 Supervision of high risk pregnancy, unspecified, unspecified trimester: Secondary | ICD-10-CM

## 2021-09-11 DIAGNOSIS — Z3689 Encounter for other specified antenatal screening: Secondary | ICD-10-CM | POA: Diagnosis not present

## 2021-09-11 DIAGNOSIS — O09899 Supervision of other high risk pregnancies, unspecified trimester: Secondary | ICD-10-CM

## 2021-09-11 DIAGNOSIS — O09892 Supervision of other high risk pregnancies, second trimester: Secondary | ICD-10-CM | POA: Insufficient documentation

## 2021-09-11 DIAGNOSIS — E669 Obesity, unspecified: Secondary | ICD-10-CM

## 2021-09-11 DIAGNOSIS — O10919 Unspecified pre-existing hypertension complicating pregnancy, unspecified trimester: Secondary | ICD-10-CM

## 2021-09-11 DIAGNOSIS — O113 Pre-existing hypertension with pre-eclampsia, third trimester: Secondary | ICD-10-CM | POA: Diagnosis not present

## 2021-09-11 DIAGNOSIS — O99212 Obesity complicating pregnancy, second trimester: Secondary | ICD-10-CM | POA: Diagnosis not present

## 2021-09-11 DIAGNOSIS — Z8759 Personal history of other complications of pregnancy, childbirth and the puerperium: Secondary | ICD-10-CM

## 2021-09-12 ENCOUNTER — Encounter: Payer: Self-pay | Admitting: Nurse Practitioner

## 2021-09-12 DIAGNOSIS — O10919 Unspecified pre-existing hypertension complicating pregnancy, unspecified trimester: Secondary | ICD-10-CM | POA: Insufficient documentation

## 2021-09-18 ENCOUNTER — Ambulatory Visit (INDEPENDENT_AMBULATORY_CARE_PROVIDER_SITE_OTHER): Payer: Medicaid Other | Admitting: Obstetrics and Gynecology

## 2021-09-18 ENCOUNTER — Other Ambulatory Visit: Payer: Self-pay

## 2021-09-18 ENCOUNTER — Encounter: Payer: Self-pay | Admitting: Obstetrics and Gynecology

## 2021-09-18 VITALS — BP 125/75 | HR 78 | Wt 282.2 lb

## 2021-09-18 DIAGNOSIS — O10919 Unspecified pre-existing hypertension complicating pregnancy, unspecified trimester: Secondary | ICD-10-CM

## 2021-09-18 DIAGNOSIS — O99210 Obesity complicating pregnancy, unspecified trimester: Secondary | ICD-10-CM

## 2021-09-18 DIAGNOSIS — Z8759 Personal history of other complications of pregnancy, childbirth and the puerperium: Secondary | ICD-10-CM

## 2021-09-18 DIAGNOSIS — O34219 Maternal care for unspecified type scar from previous cesarean delivery: Secondary | ICD-10-CM

## 2021-09-18 DIAGNOSIS — O0990 Supervision of high risk pregnancy, unspecified, unspecified trimester: Secondary | ICD-10-CM

## 2021-09-18 NOTE — Progress Notes (Signed)
   PRENATAL VISIT NOTE  Subjective:  Leslie Ali is a 30 y.o. [phone] at [redacted]w[redacted]d being seen today for ongoing prenatal care.  She is currently monitored for the following issues for this high-risk pregnancy and has History of pre-eclampsia; Supervision of high risk pregnancy, antepartum; Short interval between pregnancies complicating pregnancy, antepartum; Anxiety and depression; BMI 45.0-49.9, adult (HCC); Seasonal allergies; Chronic hypertension during pregnancy, antepartum; Maternal obesity affecting pregnancy, antepartum; and Previous cesarean delivery affecting pregnancy on their problem list.  Patient reports no complaints.  Contractions: Not present. Vag. Bleeding: None.  Movement: Present. Denies leaking of fluid.   The following portions of the patient's history were reviewed and updated as appropriate: allergies, current medications, past family history, past medical history, past social history, past surgical history and problem list.   Objective:   Vitals:   09/18/21 0940  BP: 125/75  Pulse: 78  Weight: 282 lb 3.2 oz (128 kg)    Fetal Status: Fetal Heart Rate (bpm): 139   Movement: Present     General:  Alert, oriented and cooperative. Patient is in no acute distress.  Skin: Skin is warm and dry. No rash noted.   Cardiovascular: Normal heart rate noted  Respiratory: Normal respiratory effort, no problems with respiration noted  Abdomen: Soft, gravid, appropriate for gestational age.  Pain/Pressure: Absent     Pelvic: Cervical exam deferred        Extremities: Normal range of motion.  Edema: None  Mental Status: Normal mood and affect. Normal behavior. Normal judgment and thought content.   Assessment and Plan:  Pregnancy: [id number] at [redacted]w[redacted]d 1. Supervision of high risk pregnancy, antepartum Patient is doing well without complaints Declined AFP   2. Chronic hypertension during pregnancy, antepartum Stable without medication Follow up growth ultrasound  scheduled  3. History of pre-eclampsia Patient advised to start taking ASA  4. Maternal obesity affecting pregnancy, antepartum   5. Previous cesarean delivery affecting pregnancy Will be scheduled for a repeat  Preterm labor symptoms and general obstetric precautions including but not limited to vaginal bleeding, contractions, leaking of fluid and fetal movement were reviewed in detail with the patient. Please refer to After Visit Summary for other counseling recommendations.   Return in about 4 weeks (around 10/16/2021) for in person, ROB, High risk.  Future Appointments  Date Time Provider Department Center  10/11/2021  9:30 AM [facility] NURSE [facility]  10/11/2021  9:45 AM [facility] US5 [facility]    Catalina Antigua, MD

## 2021-10-11 ENCOUNTER — Ambulatory Visit: Payer: Medicaid Other | Attending: Obstetrics and Gynecology

## 2021-10-11 ENCOUNTER — Ambulatory Visit: Payer: Medicaid Other | Admitting: *Deleted

## 2021-10-11 ENCOUNTER — Other Ambulatory Visit: Payer: Self-pay | Admitting: *Deleted

## 2021-10-11 ENCOUNTER — Other Ambulatory Visit: Payer: Self-pay

## 2021-10-11 VITALS — BP 134/56 | HR 78

## 2021-10-11 DIAGNOSIS — O34219 Maternal care for unspecified type scar from previous cesarean delivery: Secondary | ICD-10-CM | POA: Diagnosis not present

## 2021-10-11 DIAGNOSIS — O99212 Obesity complicating pregnancy, second trimester: Secondary | ICD-10-CM

## 2021-10-11 DIAGNOSIS — O0990 Supervision of high risk pregnancy, unspecified, unspecified trimester: Secondary | ICD-10-CM | POA: Insufficient documentation

## 2021-10-11 DIAGNOSIS — Z3A23 23 weeks gestation of pregnancy: Secondary | ICD-10-CM

## 2021-10-11 DIAGNOSIS — O09899 Supervision of other high risk pregnancies, unspecified trimester: Secondary | ICD-10-CM | POA: Insufficient documentation

## 2021-10-11 DIAGNOSIS — O99210 Obesity complicating pregnancy, unspecified trimester: Secondary | ICD-10-CM

## 2021-10-11 DIAGNOSIS — Z8759 Personal history of other complications of pregnancy, childbirth and the puerperium: Secondary | ICD-10-CM | POA: Insufficient documentation

## 2021-10-11 DIAGNOSIS — O09212 Supervision of pregnancy with history of pre-term labor, second trimester: Secondary | ICD-10-CM

## 2021-10-11 DIAGNOSIS — O10012 Pre-existing essential hypertension complicating pregnancy, second trimester: Secondary | ICD-10-CM | POA: Diagnosis not present

## 2021-10-11 DIAGNOSIS — O10919 Unspecified pre-existing hypertension complicating pregnancy, unspecified trimester: Secondary | ICD-10-CM | POA: Diagnosis present

## 2021-10-11 DIAGNOSIS — O10912 Unspecified pre-existing hypertension complicating pregnancy, second trimester: Secondary | ICD-10-CM

## 2021-10-18 ENCOUNTER — Encounter: Payer: Medicaid Other | Admitting: Obstetrics and Gynecology

## 2021-11-05 ENCOUNTER — Ambulatory Visit (INDEPENDENT_AMBULATORY_CARE_PROVIDER_SITE_OTHER): Payer: Medicaid Other | Admitting: Obstetrics and Gynecology

## 2021-11-05 ENCOUNTER — Other Ambulatory Visit: Payer: Self-pay

## 2021-11-05 ENCOUNTER — Encounter: Payer: Self-pay | Admitting: Obstetrics and Gynecology

## 2021-11-05 VITALS — BP 132/76 | HR 92 | Wt 286.5 lb

## 2021-11-05 DIAGNOSIS — O10919 Unspecified pre-existing hypertension complicating pregnancy, unspecified trimester: Secondary | ICD-10-CM

## 2021-11-05 DIAGNOSIS — O99210 Obesity complicating pregnancy, unspecified trimester: Secondary | ICD-10-CM

## 2021-11-05 DIAGNOSIS — O0990 Supervision of high risk pregnancy, unspecified, unspecified trimester: Secondary | ICD-10-CM

## 2021-11-05 DIAGNOSIS — O09899 Supervision of other high risk pregnancies, unspecified trimester: Secondary | ICD-10-CM

## 2021-11-05 DIAGNOSIS — Z6841 Body Mass Index (BMI) 40.0 and over, adult: Secondary | ICD-10-CM

## 2021-11-05 DIAGNOSIS — O34219 Maternal care for unspecified type scar from previous cesarean delivery: Secondary | ICD-10-CM

## 2021-11-05 DIAGNOSIS — N736 Female pelvic peritoneal adhesions (postinfective): Secondary | ICD-10-CM | POA: Insufficient documentation

## 2021-11-05 DIAGNOSIS — Z8759 Personal history of other complications of pregnancy, childbirth and the puerperium: Secondary | ICD-10-CM

## 2021-11-05 NOTE — Progress Notes (Signed)
° ° °  PRENATAL VISIT NOTE  Subjective:  Leslie Ali is a 31 y.o. [phone] at [redacted]w[redacted]d being seen today for ongoing prenatal care.  She is currently monitored for the following issues for this high-risk pregnancy and has History of pre-eclampsia; Supervision of high risk pregnancy, antepartum; Short interval between pregnancies complicating pregnancy, antepartum; Anxiety and depression; BMI 50.0-59.9, adult ([place]); Seasonal allergies; Chronic hypertension during pregnancy, antepartum; Maternal obesity affecting pregnancy, antepartum; and Previous cesarean delivery affecting pregnancy on their problem list.  Patient reports no complaints.  Contractions: Not present. Vag. Bleeding: None.  Movement: Present. Denies leaking of fluid.   The following portions of the patient's history were reviewed and updated as appropriate: allergies, current medications, past family history, past medical history, past social history, past surgical history and problem list.   Objective:   Vitals:   11/05/21 1026 11/05/21 1035  BP: (!) 127/91 132/76  Pulse: 92 92  Weight: 286 lb 8 oz (130 kg)     Fetal Status: Fetal Heart Rate (bpm): 147   Movement: Present     General:  Alert, oriented and cooperative. Patient is in no acute distress.  Skin: Skin is warm and dry. No rash noted.   Cardiovascular: Normal heart rate noted  Respiratory: Normal respiratory effort, no problems with respiration noted  Abdomen: Soft, gravid, appropriate for gestational age.  Pain/Pressure: Present     Pelvic: Cervical exam deferred        Extremities: Normal range of motion.  Edema: None  Mental Status: Normal mood and affect. Normal behavior. Normal judgment and thought content.   Assessment and Plan:  Pregnancy: [id number] at [redacted]w[redacted]d 1. Supervision of high risk pregnancy, antepartum Needs 28wk labs (not scheduled for this) next visit Nexplanon  2. Chronic hypertension during pregnancy, antepartum Doing well on no meds. Pt states  she never started the ASA 12/15: 80%, 724gm, ac 96%, afi normal. F/u rpt on 1/13. Mfm likely to start qwk testing at 32wks  3. Previous cesarean delivery affecting pregnancy X3. She states she had her last one in [place]. I was able to find her records in care everywhere from 11/2020. 35wk rpt c/s for severe pre-eclampsia. Op note showed: Extensive scarring of anterior abdominal wall with adhesions to anterior uterus; thin lower uterine segment  Given op note and short interval pregnancy, consider 37-38wk delivery  4. Short interval between pregnancies complicating pregnancy, antepartum  5. Maternal obesity affecting pregnancy, antepartum  6. BMI 50.0-59.9, adult ([place])  7. History of pre-eclampsia  Preterm labor symptoms and general obstetric precautions including but not limited to vaginal bleeding, contractions, leaking of fluid and fetal movement were reviewed in detail with the patient. Please refer to After Visit Summary for other counseling recommendations.   Return in about 1 week (around 11/12/2021) for fasting 2hr GTT, md visit, in person.  Future Appointments  Date Time Provider [place]  11/09/2021  9:45 AM [facility] NURSE [facility]  11/09/2021 10:00 AM [facility] US1 [facility]  11/13/2021  8:50 AM [facility] LAB [facility]  11/13/2021  9:55 AM Genia Del, MD [facility]    Aletha Halim, MD

## 2021-11-09 ENCOUNTER — Encounter: Payer: Self-pay | Admitting: *Deleted

## 2021-11-09 ENCOUNTER — Other Ambulatory Visit: Payer: Self-pay

## 2021-11-09 ENCOUNTER — Ambulatory Visit: Payer: Medicaid Other | Attending: Obstetrics

## 2021-11-09 ENCOUNTER — Ambulatory Visit: Payer: Medicaid Other | Admitting: *Deleted

## 2021-11-09 ENCOUNTER — Other Ambulatory Visit: Payer: Self-pay | Admitting: *Deleted

## 2021-11-09 VITALS — BP 135/79 | HR 89

## 2021-11-09 DIAGNOSIS — O09299 Supervision of pregnancy with other poor reproductive or obstetric history, unspecified trimester: Secondary | ICD-10-CM

## 2021-11-09 DIAGNOSIS — O10912 Unspecified pre-existing hypertension complicating pregnancy, second trimester: Secondary | ICD-10-CM | POA: Insufficient documentation

## 2021-11-09 DIAGNOSIS — O34219 Maternal care for unspecified type scar from previous cesarean delivery: Secondary | ICD-10-CM | POA: Insufficient documentation

## 2021-11-09 DIAGNOSIS — Z3A28 28 weeks gestation of pregnancy: Secondary | ICD-10-CM | POA: Diagnosis not present

## 2021-11-09 DIAGNOSIS — Z8759 Personal history of other complications of pregnancy, childbirth and the puerperium: Secondary | ICD-10-CM

## 2021-11-09 DIAGNOSIS — O10013 Pre-existing essential hypertension complicating pregnancy, third trimester: Secondary | ICD-10-CM

## 2021-11-09 DIAGNOSIS — O0990 Supervision of high risk pregnancy, unspecified, unspecified trimester: Secondary | ICD-10-CM | POA: Insufficient documentation

## 2021-11-09 DIAGNOSIS — O99212 Obesity complicating pregnancy, second trimester: Secondary | ICD-10-CM | POA: Insufficient documentation

## 2021-11-09 DIAGNOSIS — Z6841 Body Mass Index (BMI) 40.0 and over, adult: Secondary | ICD-10-CM

## 2021-11-09 DIAGNOSIS — O09899 Supervision of other high risk pregnancies, unspecified trimester: Secondary | ICD-10-CM

## 2021-11-09 DIAGNOSIS — O99210 Obesity complicating pregnancy, unspecified trimester: Secondary | ICD-10-CM | POA: Diagnosis present

## 2021-11-09 DIAGNOSIS — E669 Obesity, unspecified: Secondary | ICD-10-CM

## 2021-11-09 DIAGNOSIS — O99213 Obesity complicating pregnancy, third trimester: Secondary | ICD-10-CM | POA: Diagnosis not present

## 2021-11-09 DIAGNOSIS — O10913 Unspecified pre-existing hypertension complicating pregnancy, third trimester: Secondary | ICD-10-CM

## 2021-11-12 ENCOUNTER — Other Ambulatory Visit: Payer: Self-pay | Admitting: *Deleted

## 2021-11-12 DIAGNOSIS — O0990 Supervision of high risk pregnancy, unspecified, unspecified trimester: Secondary | ICD-10-CM

## 2021-11-13 ENCOUNTER — Other Ambulatory Visit: Payer: Medicaid Other

## 2021-11-13 ENCOUNTER — Other Ambulatory Visit: Payer: Self-pay

## 2021-11-13 ENCOUNTER — Ambulatory Visit (INDEPENDENT_AMBULATORY_CARE_PROVIDER_SITE_OTHER): Payer: Medicaid Other | Admitting: Family Medicine

## 2021-11-13 ENCOUNTER — Encounter: Payer: Self-pay | Admitting: Family Medicine

## 2021-11-13 VITALS — BP 128/83 | HR 83 | Wt 285.0 lb

## 2021-11-13 DIAGNOSIS — O99210 Obesity complicating pregnancy, unspecified trimester: Secondary | ICD-10-CM

## 2021-11-13 DIAGNOSIS — O34219 Maternal care for unspecified type scar from previous cesarean delivery: Secondary | ICD-10-CM

## 2021-11-13 DIAGNOSIS — O0990 Supervision of high risk pregnancy, unspecified, unspecified trimester: Secondary | ICD-10-CM | POA: Diagnosis not present

## 2021-11-13 DIAGNOSIS — Z3A28 28 weeks gestation of pregnancy: Secondary | ICD-10-CM

## 2021-11-13 DIAGNOSIS — Z23 Encounter for immunization: Secondary | ICD-10-CM | POA: Diagnosis not present

## 2021-11-13 DIAGNOSIS — O10919 Unspecified pre-existing hypertension complicating pregnancy, unspecified trimester: Secondary | ICD-10-CM

## 2021-11-13 NOTE — Progress Notes (Signed)
° °  PRENATAL VISIT NOTE  Subjective:  Leslie Ali is a 31 y.o. [phone] at [redacted]w[redacted]d being seen today for ongoing prenatal care.  She is currently monitored for the following issues for this high-risk pregnancy and has History of pre-eclampsia; Supervision of high risk pregnancy, antepartum; Short interval between pregnancies complicating pregnancy, antepartum; Anxiety and depression; BMI 50.0-59.9, adult (HCC); Seasonal allergies; Chronic hypertension during pregnancy, antepartum; Maternal obesity affecting pregnancy, antepartum; Previous cesarean delivery affecting pregnancy; and Pelvic adhesions on their problem list.  Patient reports no complaints.  Contractions: Not present. Vag. Bleeding: None.  Movement: Present. Denies leaking of fluid.   The following portions of the patient's history were reviewed and updated as appropriate: allergies, current medications, past family history, past medical history, past social history, past surgical history and problem list.   Objective:   Vitals:   11/13/21 1004  BP: 128/83  Pulse: 83  Weight: 285 lb (129.3 kg)    Fetal Status: Fetal Heart Rate (bpm): 141   Movement: Present   General:  Alert, oriented and cooperative. Patient is in no acute distress.  Skin: Skin is warm and dry. No rash noted.   Cardiovascular: Normal heart rate noted.  Respiratory: Normal respiratory effort, no problems with respiration noted.  Abdomen: Soft, gravid, appropriate for gestational age.  Pain/Pressure: Absent.     Pelvic: Cervical exam deferred.  Extremities: Normal range of motion.  Edema: None.  Mental Status: Normal mood and affect. Normal behavior. Normal judgment and thought content.   Assessment and Plan:  Pregnancy: [id number] at [redacted]w[redacted]d  1. Supervision of high risk pregnancy, antepartum 2. [redacted] weeks gestation of pregnancy Progressing well. No concerns. Counseled regarding birth control options today, considering Nexplanon. 28 week labs and Tdap  completed. Will follow up results. Next visit in 2 weeks. - Tdap vaccine greater than or equal to 7yo IM  3. Chronic hypertension during pregnancy, antepartum BP normal today. No symptoms. No medications currently. Will continue to monitor. Antenatal testing per MFM. Last growth at 96% for EFW at 28 weeks.   4. Previous cesarean delivery affecting pregnancy Planning for repeat cesarean ~37-38 weeks due to extensive scarring of anterior abdominal wall, adhesions to anterior uterus, and thin lower uterine segment per last op note.   Preterm labor symptoms and general obstetric precautions including but not limited to vaginal bleeding, contractions, leaking of fluid and fetal movement were reviewed in detail with the patient.  Please refer to After Visit Summary for other counseling recommendations.   Return in about 2 weeks (around 11/27/2021) for follow up HR OB visit.  Future Appointments  Date Time Provider Department Center  11/29/2021 10:15 AM Hermina Staggers, MD [facility]  12/07/2021  9:00 AM [facility] NURSE [facility]  12/07/2021  9:15 AM [facility] US2 [facility]  12/21/2021  8:30 AM [facility] NURSE [facility]  12/21/2021  8:45 AM [facility] US4 [facility]   Worthy Rancher, MD

## 2021-11-14 LAB — CBC
Hematocrit: 35.2 % (ref 34.0–46.6)
Hemoglobin: 11.4 g/dL (ref 11.1–15.9)
MCH: 23.2 pg — ABNORMAL LOW (ref 26.6–33.0)
MCHC: 32.4 g/dL (ref 31.5–35.7)
MCV: 72 fL — ABNORMAL LOW (ref 79–97)
Platelets: 370 10*3/uL (ref 150–450)
RBC: 4.92 x10E6/uL (ref 3.77–5.28)
RDW: 15.5 % — ABNORMAL HIGH (ref 11.7–15.4)
WBC: 12.4 10*3/uL — ABNORMAL HIGH (ref 3.4–10.8)

## 2021-11-14 LAB — HIV ANTIBODY (ROUTINE TESTING W REFLEX): HIV Screen 4th Generation wRfx: NONREACTIVE

## 2021-11-14 LAB — RPR: RPR Ser Ql: NONREACTIVE

## 2021-11-14 LAB — GLUCOSE TOLERANCE, 2 HOURS W/ 1HR
Glucose, 1 hour: 127 mg/dL (ref 70–179)
Glucose, 2 hour: 82 mg/dL (ref 70–152)
Glucose, Fasting: 84 mg/dL (ref 70–91)

## 2021-11-29 ENCOUNTER — Other Ambulatory Visit: Payer: Self-pay

## 2021-11-29 ENCOUNTER — Ambulatory Visit (INDEPENDENT_AMBULATORY_CARE_PROVIDER_SITE_OTHER): Payer: Medicaid Other | Admitting: Obstetrics and Gynecology

## 2021-11-29 ENCOUNTER — Encounter: Payer: Self-pay | Admitting: Obstetrics and Gynecology

## 2021-11-29 VITALS — BP 139/83 | HR 80 | Wt 292.7 lb

## 2021-11-29 DIAGNOSIS — O10919 Unspecified pre-existing hypertension complicating pregnancy, unspecified trimester: Secondary | ICD-10-CM

## 2021-11-29 DIAGNOSIS — O34219 Maternal care for unspecified type scar from previous cesarean delivery: Secondary | ICD-10-CM

## 2021-11-29 DIAGNOSIS — O0990 Supervision of high risk pregnancy, unspecified, unspecified trimester: Secondary | ICD-10-CM

## 2021-11-29 NOTE — Progress Notes (Signed)
Subjective:  Leslie Ali is a 31 y.o. [phone] at [redacted]w[redacted]d being seen today for ongoing prenatal care.  She is currently monitored for the following issues for this high-risk pregnancy and has History of pre-eclampsia; Supervision of high risk pregnancy, antepartum; Short interval between pregnancies complicating pregnancy, antepartum; Anxiety and depression; BMI 50.0-59.9, adult ([place]); Seasonal allergies; Chronic hypertension during pregnancy, antepartum; Maternal obesity affecting pregnancy, antepartum; Previous cesarean delivery affecting pregnancy; and Pelvic adhesions on their problem list.  Patient reports general discomforts of pregnancy.  Contractions: Not present. Vag. Bleeding: None.  Movement: Present. Denies leaking of fluid.   The following portions of the patient's history were reviewed and updated as appropriate: allergies, current medications, past family history, past medical history, past social history, past surgical history and problem list. Problem list updated.  Objective:   Vitals:   11/29/21 1015  BP: 139/83  Pulse: 80  Weight: 292 lb 11.2 oz (132.8 kg)    Fetal Status: Fetal Heart Rate (bpm): 145   Movement: Present     General:  Alert, oriented and cooperative. Patient is in no acute distress.  Skin: Skin is warm and dry. No rash noted.   Cardiovascular: Normal heart rate noted  Respiratory: Normal respiratory effort, no problems with respiration noted  Abdomen: Soft, gravid, appropriate for gestational age. Pain/Pressure: Absent     Pelvic:  Cervical exam deferred        Extremities: Normal range of motion.  Edema: None  Mental Status: Normal mood and affect. Normal behavior. Normal judgment and thought content.   Urinalysis:      Assessment and Plan:  Pregnancy: [id number] at [redacted]w[redacted]d  1. Supervision of high risk pregnancy, antepartum Stable  2. Chronic hypertension during pregnancy, antepartum BP stable  No meds Serial growth scans as per MFM  3.  Previous cesarean delivery affecting pregnancy For repeat at 37-38 weeks. H/O extensive pelvic adhesive disease.  Preterm labor symptoms and general obstetric precautions including but not limited to vaginal bleeding, contractions, leaking of fluid and fetal movement were reviewed in detail with the patient. Please refer to After Visit Summary for other counseling recommendations.  Return in about 2 weeks (around 12/13/2021) for OB visit, face to face, MD only.   Chancy Milroy, MD

## 2021-11-29 NOTE — Patient Instructions (Signed)

## 2021-12-07 ENCOUNTER — Other Ambulatory Visit: Payer: Self-pay

## 2021-12-07 ENCOUNTER — Encounter: Payer: Self-pay | Admitting: Family Medicine

## 2021-12-07 ENCOUNTER — Other Ambulatory Visit: Payer: Self-pay | Admitting: *Deleted

## 2021-12-07 ENCOUNTER — Ambulatory Visit: Payer: Medicaid Other | Admitting: *Deleted

## 2021-12-07 ENCOUNTER — Ambulatory Visit: Payer: Medicaid Other | Attending: Maternal & Fetal Medicine

## 2021-12-07 ENCOUNTER — Encounter: Payer: Self-pay | Admitting: *Deleted

## 2021-12-07 ENCOUNTER — Other Ambulatory Visit: Payer: Self-pay | Admitting: Family Medicine

## 2021-12-07 VITALS — BP 156/63 | HR 97

## 2021-12-07 DIAGNOSIS — O99210 Obesity complicating pregnancy, unspecified trimester: Secondary | ICD-10-CM | POA: Insufficient documentation

## 2021-12-07 DIAGNOSIS — O09299 Supervision of pregnancy with other poor reproductive or obstetric history, unspecified trimester: Secondary | ICD-10-CM | POA: Insufficient documentation

## 2021-12-07 DIAGNOSIS — O0990 Supervision of high risk pregnancy, unspecified, unspecified trimester: Secondary | ICD-10-CM

## 2021-12-07 DIAGNOSIS — O10013 Pre-existing essential hypertension complicating pregnancy, third trimester: Secondary | ICD-10-CM | POA: Diagnosis not present

## 2021-12-07 DIAGNOSIS — O09899 Supervision of other high risk pregnancies, unspecified trimester: Secondary | ICD-10-CM | POA: Insufficient documentation

## 2021-12-07 DIAGNOSIS — Z6841 Body Mass Index (BMI) 40.0 and over, adult: Secondary | ICD-10-CM

## 2021-12-07 DIAGNOSIS — O10913 Unspecified pre-existing hypertension complicating pregnancy, third trimester: Secondary | ICD-10-CM

## 2021-12-07 DIAGNOSIS — O99213 Obesity complicating pregnancy, third trimester: Secondary | ICD-10-CM

## 2021-12-07 DIAGNOSIS — Z3A32 32 weeks gestation of pregnancy: Secondary | ICD-10-CM

## 2021-12-07 DIAGNOSIS — O34219 Maternal care for unspecified type scar from previous cesarean delivery: Secondary | ICD-10-CM

## 2021-12-07 DIAGNOSIS — E668 Other obesity: Secondary | ICD-10-CM

## 2021-12-07 DIAGNOSIS — O09293 Supervision of pregnancy with other poor reproductive or obstetric history, third trimester: Secondary | ICD-10-CM | POA: Diagnosis not present

## 2021-12-07 DIAGNOSIS — Z8759 Personal history of other complications of pregnancy, childbirth and the puerperium: Secondary | ICD-10-CM | POA: Diagnosis present

## 2021-12-07 MED ORDER — LABETALOL HCL 200 MG PO TABS: 200.0000 mg | ORAL_TABLET | Freq: Two times a day (BID) | ORAL | 3 refills | Status: DC

## 2021-12-07 NOTE — Progress Notes (Unsigned)
Rx for elevated BP sent in

## 2021-12-17 ENCOUNTER — Encounter: Payer: Medicaid Other | Admitting: Obstetrics and Gynecology

## 2021-12-18 ENCOUNTER — Other Ambulatory Visit: Payer: Self-pay

## 2021-12-18 ENCOUNTER — Ambulatory Visit (INDEPENDENT_AMBULATORY_CARE_PROVIDER_SITE_OTHER): Payer: Medicaid Other | Admitting: Family Medicine

## 2021-12-18 ENCOUNTER — Encounter: Payer: Self-pay | Admitting: Family Medicine

## 2021-12-18 VITALS — BP 118/80 | HR 83 | Wt 291.0 lb

## 2021-12-18 DIAGNOSIS — Z5941 Food insecurity: Secondary | ICD-10-CM

## 2021-12-18 DIAGNOSIS — N736 Female pelvic peritoneal adhesions (postinfective): Secondary | ICD-10-CM

## 2021-12-18 DIAGNOSIS — O09899 Supervision of other high risk pregnancies, unspecified trimester: Secondary | ICD-10-CM

## 2021-12-18 DIAGNOSIS — F419 Anxiety disorder, unspecified: Secondary | ICD-10-CM

## 2021-12-18 DIAGNOSIS — Z8759 Personal history of other complications of pregnancy, childbirth and the puerperium: Secondary | ICD-10-CM

## 2021-12-18 DIAGNOSIS — F32A Depression, unspecified: Secondary | ICD-10-CM

## 2021-12-18 DIAGNOSIS — O0990 Supervision of high risk pregnancy, unspecified, unspecified trimester: Secondary | ICD-10-CM

## 2021-12-18 DIAGNOSIS — O10919 Unspecified pre-existing hypertension complicating pregnancy, unspecified trimester: Secondary | ICD-10-CM

## 2021-12-18 DIAGNOSIS — O99210 Obesity complicating pregnancy, unspecified trimester: Secondary | ICD-10-CM

## 2021-12-18 DIAGNOSIS — O34219 Maternal care for unspecified type scar from previous cesarean delivery: Secondary | ICD-10-CM

## 2021-12-18 NOTE — Progress Notes (Signed)
Subjective:  Leslie Ali is a 31 y.o. [phone] at [redacted]w[redacted]d being seen today for ongoing prenatal care.  She is currently monitored for the following issues for this high-risk pregnancy and has History of pre-eclampsia; Supervision of high risk pregnancy, antepartum; Short interval between pregnancies complicating pregnancy, antepartum; Anxiety and depression; BMI 50.0-59.9, adult ([place]); Seasonal allergies; Chronic hypertension during pregnancy, antepartum; Maternal obesity affecting pregnancy, antepartum; Previous cesarean delivery affecting pregnancy; and Pelvic adhesions on their problem list.  Patient reports no complaints.  Contractions: Not present. Vag. Bleeding: None.  Movement: Present. Denies leaking of fluid.   The following portions of the patient's history were reviewed and updated as appropriate: allergies, current medications, past family history, past medical history, past social history, past surgical history and problem list. Problem list updated.  Objective:   Vitals:   12/18/21 1036  BP: 118/80  Pulse: 83  Weight: 291 lb (132 kg)    Fetal Status: Fetal Heart Rate (bpm): 152   Movement: Present     General:  Alert, oriented and cooperative. Patient is in no acute distress.  Skin: Skin is warm and dry. No rash noted.   Cardiovascular: Normal heart rate noted  Respiratory: Normal respiratory effort, no problems with respiration noted  Abdomen: Soft, gravid, appropriate for gestational age. Pain/Pressure: Present     Pelvic: Vag. Bleeding: None     Cervical exam deferred        Extremities: Normal range of motion.  Edema: None  Mental Status: Normal mood and affect. Normal behavior. Normal judgment and thought content.   Urinalysis:      Assessment and Plan:  Pregnancy: [id number] at [redacted]w[redacted]d  1. Supervision of high risk pregnancy, antepartum BP and FHR normal Talked at length about possible BTL with caveat that if there is extensive scarring it may not be possible  at the time of cesarean Patient previously very hesitant due to her sister having significant hemorrhage after her fourth cesarean + BTL. She reports sister did not have any internal bleeding, was all vaginal We reviewed anatomical pictures and discussed how this was much more likely to be due to uterine atony and not from sister's tubal ligation She is very firm in desire to not have any more pregnancies We discussed nature of the procedure, that other long acting but reversible forms of contraception exist, and that there is a benefit of lower ovarian cancer risk with salpingectomy. We discussed that surgical risks are similar to cesarean, increased risk of ectopic as well She is still undecided but I suggested we sign the BTL consent today as she is only about 30 days away from delivery I stressed that the form is a consent, NOT a contract, and that she can change her mind at any time BTL form signed, she will talk it over with family and let us know  2. Food insecurity Food market today - AMBULATORY REFERRAL TO [place]  3. Chronic hypertension during pregnancy, antepartum BP well controlled on labetalol 200mg  BID Not on ASA Engaged in weekly fetal testing, normal to date  4. History of pre-eclampsia   5. Short interval between pregnancies complicating pregnancy, antepartum Last delivery 11/2020  6. Anxiety and depression Reports mood is good Discussed BH is available if she needs it  7. Previous cesarean delivery affecting pregnancy X3, prior op note reported extensive scarring Message sent to schedule at 38 weeks given findings on prior op note, cHTN on meds, etc.  Will schedule with two attending  8. Pelvic adhesions See above  9. Maternal obesity affecting pregnancy, antepartum   Preterm labor symptoms and general obstetric precautions including but not limited to vaginal bleeding, contractions, leaking of fluid and fetal movement were reviewed in detail with  the patient. Please refer to After Visit Summary for other counseling recommendations.  Return in 2 weeks (on 01/01/2022) for [facility], ob visit, needs MD.   Clarnce Flock, MD

## 2021-12-18 NOTE — Patient Instructions (Signed)

## 2021-12-21 ENCOUNTER — Ambulatory Visit: Payer: Medicaid Other | Attending: Obstetrics

## 2021-12-21 ENCOUNTER — Ambulatory Visit: Payer: Medicaid Other | Admitting: *Deleted

## 2021-12-21 ENCOUNTER — Other Ambulatory Visit: Payer: Self-pay | Admitting: *Deleted

## 2021-12-21 ENCOUNTER — Encounter: Payer: Self-pay | Admitting: *Deleted

## 2021-12-21 ENCOUNTER — Other Ambulatory Visit: Payer: Self-pay

## 2021-12-21 VITALS — BP 144/67 | HR 72

## 2021-12-21 DIAGNOSIS — Z8759 Personal history of other complications of pregnancy, childbirth and the puerperium: Secondary | ICD-10-CM | POA: Insufficient documentation

## 2021-12-21 DIAGNOSIS — Z6841 Body Mass Index (BMI) 40.0 and over, adult: Secondary | ICD-10-CM | POA: Diagnosis present

## 2021-12-21 DIAGNOSIS — O09299 Supervision of pregnancy with other poor reproductive or obstetric history, unspecified trimester: Secondary | ICD-10-CM | POA: Diagnosis present

## 2021-12-21 DIAGNOSIS — O10013 Pre-existing essential hypertension complicating pregnancy, third trimester: Secondary | ICD-10-CM

## 2021-12-21 DIAGNOSIS — Z3A34 34 weeks gestation of pregnancy: Secondary | ICD-10-CM | POA: Diagnosis not present

## 2021-12-21 DIAGNOSIS — O99210 Obesity complicating pregnancy, unspecified trimester: Secondary | ICD-10-CM | POA: Insufficient documentation

## 2021-12-21 DIAGNOSIS — O34219 Maternal care for unspecified type scar from previous cesarean delivery: Secondary | ICD-10-CM

## 2021-12-21 DIAGNOSIS — O0990 Supervision of high risk pregnancy, unspecified, unspecified trimester: Secondary | ICD-10-CM | POA: Insufficient documentation

## 2021-12-21 DIAGNOSIS — O10913 Unspecified pre-existing hypertension complicating pregnancy, third trimester: Secondary | ICD-10-CM

## 2021-12-21 DIAGNOSIS — O09899 Supervision of other high risk pregnancies, unspecified trimester: Secondary | ICD-10-CM | POA: Diagnosis present

## 2021-12-21 DIAGNOSIS — Z98891 History of uterine scar from previous surgery: Secondary | ICD-10-CM

## 2021-12-21 DIAGNOSIS — O09293 Supervision of pregnancy with other poor reproductive or obstetric history, third trimester: Secondary | ICD-10-CM

## 2021-12-24 ENCOUNTER — Other Ambulatory Visit: Payer: Self-pay | Admitting: Obstetrics and Gynecology

## 2021-12-27 ENCOUNTER — Other Ambulatory Visit: Payer: Self-pay

## 2021-12-27 ENCOUNTER — Ambulatory Visit: Payer: Medicaid Other | Attending: Nurse Practitioner

## 2021-12-27 ENCOUNTER — Ambulatory Visit: Payer: Medicaid Other | Admitting: *Deleted

## 2021-12-27 ENCOUNTER — Encounter: Payer: Self-pay | Admitting: *Deleted

## 2021-12-27 VITALS — BP 136/63 | HR 72

## 2021-12-27 DIAGNOSIS — Z3A34 34 weeks gestation of pregnancy: Secondary | ICD-10-CM | POA: Diagnosis not present

## 2021-12-27 DIAGNOSIS — O09213 Supervision of pregnancy with history of pre-term labor, third trimester: Secondary | ICD-10-CM

## 2021-12-27 DIAGNOSIS — O10013 Pre-existing essential hypertension complicating pregnancy, third trimester: Secondary | ICD-10-CM

## 2021-12-27 DIAGNOSIS — O34219 Maternal care for unspecified type scar from previous cesarean delivery: Secondary | ICD-10-CM | POA: Diagnosis not present

## 2021-12-27 DIAGNOSIS — O09899 Supervision of other high risk pregnancies, unspecified trimester: Secondary | ICD-10-CM | POA: Insufficient documentation

## 2021-12-27 DIAGNOSIS — Z8759 Personal history of other complications of pregnancy, childbirth and the puerperium: Secondary | ICD-10-CM | POA: Insufficient documentation

## 2021-12-27 DIAGNOSIS — O0990 Supervision of high risk pregnancy, unspecified, unspecified trimester: Secondary | ICD-10-CM | POA: Insufficient documentation

## 2021-12-27 DIAGNOSIS — O99210 Obesity complicating pregnancy, unspecified trimester: Secondary | ICD-10-CM | POA: Insufficient documentation

## 2021-12-27 DIAGNOSIS — O10913 Unspecified pre-existing hypertension complicating pregnancy, third trimester: Secondary | ICD-10-CM | POA: Insufficient documentation

## 2021-12-27 DIAGNOSIS — O09293 Supervision of pregnancy with other poor reproductive or obstetric history, third trimester: Secondary | ICD-10-CM | POA: Diagnosis not present

## 2021-12-27 DIAGNOSIS — Z6841 Body Mass Index (BMI) 40.0 and over, adult: Secondary | ICD-10-CM

## 2021-12-27 DIAGNOSIS — O99213 Obesity complicating pregnancy, third trimester: Secondary | ICD-10-CM | POA: Diagnosis not present

## 2021-12-31 NOTE — Progress Notes (Signed)
Was in person visit and then changed to virtual. Pt unable to connect through MyChart. Spoke with schedulers and recommended in person visit due to high risk pregnancy.  ? ? ?Milas Hock, MD ?

## 2022-01-02 ENCOUNTER — Telehealth (INDEPENDENT_AMBULATORY_CARE_PROVIDER_SITE_OTHER): Payer: Medicaid Other | Admitting: Obstetrics and Gynecology

## 2022-01-02 DIAGNOSIS — O99210 Obesity complicating pregnancy, unspecified trimester: Secondary | ICD-10-CM

## 2022-01-02 DIAGNOSIS — O0990 Supervision of high risk pregnancy, unspecified, unspecified trimester: Secondary | ICD-10-CM

## 2022-01-02 DIAGNOSIS — F32A Depression, unspecified: Secondary | ICD-10-CM

## 2022-01-02 DIAGNOSIS — O10919 Unspecified pre-existing hypertension complicating pregnancy, unspecified trimester: Secondary | ICD-10-CM

## 2022-01-02 DIAGNOSIS — F419 Anxiety disorder, unspecified: Secondary | ICD-10-CM

## 2022-01-02 DIAGNOSIS — Z8759 Personal history of other complications of pregnancy, childbirth and the puerperium: Secondary | ICD-10-CM

## 2022-01-02 DIAGNOSIS — O34219 Maternal care for unspecified type scar from previous cesarean delivery: Secondary | ICD-10-CM

## 2022-01-02 DIAGNOSIS — O09899 Supervision of other high risk pregnancies, unspecified trimester: Secondary | ICD-10-CM

## 2022-01-03 ENCOUNTER — Ambulatory Visit: Payer: Medicaid Other | Attending: Obstetrics and Gynecology

## 2022-01-03 ENCOUNTER — Encounter: Payer: Self-pay | Admitting: *Deleted

## 2022-01-03 ENCOUNTER — Encounter: Payer: Self-pay | Admitting: Obstetrics and Gynecology

## 2022-01-03 ENCOUNTER — Ambulatory Visit (INDEPENDENT_AMBULATORY_CARE_PROVIDER_SITE_OTHER): Payer: Medicaid Other | Admitting: Obstetrics and Gynecology

## 2022-01-03 ENCOUNTER — Other Ambulatory Visit: Payer: Self-pay

## 2022-01-03 ENCOUNTER — Ambulatory Visit: Payer: Medicaid Other | Admitting: *Deleted

## 2022-01-03 VITALS — BP 135/73 | HR 84 | Wt 292.0 lb

## 2022-01-03 VITALS — BP 132/76 | HR 79

## 2022-01-03 DIAGNOSIS — O34219 Maternal care for unspecified type scar from previous cesarean delivery: Secondary | ICD-10-CM | POA: Diagnosis not present

## 2022-01-03 DIAGNOSIS — O0990 Supervision of high risk pregnancy, unspecified, unspecified trimester: Secondary | ICD-10-CM

## 2022-01-03 DIAGNOSIS — O09899 Supervision of other high risk pregnancies, unspecified trimester: Secondary | ICD-10-CM | POA: Insufficient documentation

## 2022-01-03 DIAGNOSIS — O10919 Unspecified pre-existing hypertension complicating pregnancy, unspecified trimester: Secondary | ICD-10-CM

## 2022-01-03 DIAGNOSIS — O0933 Supervision of pregnancy with insufficient antenatal care, third trimester: Secondary | ICD-10-CM | POA: Diagnosis not present

## 2022-01-03 DIAGNOSIS — O10913 Unspecified pre-existing hypertension complicating pregnancy, third trimester: Secondary | ICD-10-CM

## 2022-01-03 DIAGNOSIS — Z3009 Encounter for other general counseling and advice on contraception: Secondary | ICD-10-CM

## 2022-01-03 DIAGNOSIS — O09293 Supervision of pregnancy with other poor reproductive or obstetric history, third trimester: Secondary | ICD-10-CM | POA: Insufficient documentation

## 2022-01-03 DIAGNOSIS — E669 Obesity, unspecified: Secondary | ICD-10-CM

## 2022-01-03 DIAGNOSIS — Z6841 Body Mass Index (BMI) 40.0 and over, adult: Secondary | ICD-10-CM

## 2022-01-03 DIAGNOSIS — Z8759 Personal history of other complications of pregnancy, childbirth and the puerperium: Secondary | ICD-10-CM | POA: Insufficient documentation

## 2022-01-03 DIAGNOSIS — O09299 Supervision of pregnancy with other poor reproductive or obstetric history, unspecified trimester: Secondary | ICD-10-CM

## 2022-01-03 DIAGNOSIS — Z3A35 35 weeks gestation of pregnancy: Secondary | ICD-10-CM

## 2022-01-03 DIAGNOSIS — O10013 Pre-existing essential hypertension complicating pregnancy, third trimester: Secondary | ICD-10-CM

## 2022-01-03 DIAGNOSIS — O99210 Obesity complicating pregnancy, unspecified trimester: Secondary | ICD-10-CM | POA: Insufficient documentation

## 2022-01-03 DIAGNOSIS — O99213 Obesity complicating pregnancy, third trimester: Secondary | ICD-10-CM

## 2022-01-03 DIAGNOSIS — N736 Female pelvic peritoneal adhesions (postinfective): Secondary | ICD-10-CM

## 2022-01-03 NOTE — Progress Notes (Signed)
Subjective:  ?Leslie Ali is a 31 y.o. [phone] at [redacted]w[redacted]d being seen today for ongoing prenatal care.  She is currently monitored for the following issues for this high-risk pregnancy and has History of pre-eclampsia; Supervision of high risk pregnancy, antepartum; Short interval between pregnancies complicating pregnancy, antepartum; Anxiety and depression; BMI 50.0-59.9, adult ([place]); Seasonal allergies; Chronic hypertension during pregnancy, antepartum; Maternal obesity affecting pregnancy, antepartum; Previous cesarean delivery affecting pregnancy; Pelvic adhesions; and Unwanted fertility on their problem list. ? ?Patient reports general discomforts of pregnancy.  Contractions: Not present. Vag. Bleeding: None.  Movement: Present. Denies leaking of fluid.  ? ?The following portions of the patient's history were reviewed and updated as appropriate: allergies, current medications, past family history, past medical history, past social history, past surgical history and problem list. Problem list updated. ? ?Objective:  ? ?Vitals:  ? 01/03/22 0844  ?BP: 135/73  ?Pulse: 84  ?Weight: 292 lb (132.5 kg)  ? ? ?Fetal Status: Fetal Heart Rate (bpm): 141   Movement: Present    ? ?General:  Alert, oriented and cooperative. Patient is in no acute distress.  ?Skin: Skin is warm and dry. No rash noted.   ?Cardiovascular: Normal heart rate noted  ?Respiratory: Normal respiratory effort, no problems with respiration noted  ?Abdomen: Soft, gravid, appropriate for gestational age. Pain/Pressure: Present     ?Pelvic:  Cervical exam deferred        ?Extremities: Normal range of motion.  Edema: None  ?Mental Status: Normal mood and affect. Normal behavior. Normal judgment and thought content.  ? ?Urinalysis:     ? ?Assessment and Plan:  ?Pregnancy: [id number] at [redacted]w[redacted]d ? ?1. Supervision of high risk pregnancy, antepartum ?Stable ?GBS next visit. ?Pt declined today ? ?2. Chronic hypertension during pregnancy, antepartum ?BP  stable ?Continue with current treatment ?Serial growth scans and antenatal testing as per MFM ? ?3. Previous cesarean delivery affecting pregnancy ?Scheduled for repeat with BTL on 01/18/22 ? ?4. Pelvic adhesions ?See above ? ?5. History of pre-eclampsia ?No S/Sx at present ? ?6. Unwanted fertility ?BTL papers signed ? ?Preterm labor symptoms and general obstetric precautions including but not limited to vaginal bleeding, contractions, leaking of fluid and fetal movement were reviewed in detail with the patient. ?Please refer to After Visit Summary for other counseling recommendations.  ?Return in about 1 week (around 01/10/2022) for OB visit, face to face, MD only. ? ? ?Chancy Milroy, MD ?

## 2022-01-03 NOTE — Patient Instructions (Signed)

## 2022-01-04 ENCOUNTER — Telehealth (HOSPITAL_COMMUNITY): Payer: Self-pay | Admitting: *Deleted

## 2022-01-04 NOTE — Telephone Encounter (Signed)
Preadmission screen  

## 2022-01-04 NOTE — Patient Instructions (Signed)
Corena Pilgrim ? 01/04/2022 ? ? Your procedure is scheduled on:  01/18/2022 ? Arrive at [employer] at [employer] on [address] at [facility]  and [address]. You are invited to use the FREE valet parking or use the Visitor's parking deck. ? Pick up the phone at the desk and dial [phone]. ? Call this number if you have problems the morning of surgery: [phone] ? Remember: ? ? Do not eat food:(After Midnight) Desp?s de medianoche. ? Do not drink clear liquids: (After Midnight) Desp?s de medianoche. ? Take these medicines the morning of surgery with A SIP OF WATER:  Take labetalol as prescribed ? ? Do not wear jewelry, make-up or nail polish. ? Do not wear lotions, powders, or perfumes. Do not wear deodorant. ? Do not shave 48 hours prior to surgery. ? Do not bring valuables to the hospital.  [facility] is not  ? responsible for any belongings or valuables brought to the hospital. ? Contacts, dentures or bridgework may not be worn into surgery. ? Leave suitcase in the car. After surgery it may be brought to your room. ? For patients admitted to the hospital, checkout time is 11:00 AM the day of  ?            discharge. ? ?   ? Please read over the following fact sheets that you were given:  ?   Preparing for Surgery ? ? ?

## 2022-01-06 ENCOUNTER — Encounter (HOSPITAL_COMMUNITY): Payer: Self-pay | Admitting: Obstetrics & Gynecology

## 2022-01-06 ENCOUNTER — Inpatient Hospital Stay (HOSPITAL_COMMUNITY)
Admission: AD | Admit: 2022-01-06 | Discharge: 2022-01-06 | Disposition: A | Discharge status: Home / Self Care | Payer: Medicaid Other | Attending: Obstetrics & Gynecology | Admitting: Obstetrics & Gynecology

## 2022-01-06 ENCOUNTER — Other Ambulatory Visit: Payer: Self-pay

## 2022-01-06 DIAGNOSIS — O26893 Other specified pregnancy related conditions, third trimester: Secondary | ICD-10-CM | POA: Diagnosis present

## 2022-01-06 DIAGNOSIS — Z3A36 36 weeks gestation of pregnancy: Secondary | ICD-10-CM | POA: Diagnosis not present

## 2022-01-06 DIAGNOSIS — B3731 Acute candidiasis of vulva and vagina: Secondary | ICD-10-CM

## 2022-01-06 DIAGNOSIS — R102 Pelvic and perineal pain: Secondary | ICD-10-CM

## 2022-01-06 DIAGNOSIS — B9689 Other specified bacterial agents as the cause of diseases classified elsewhere: Secondary | ICD-10-CM

## 2022-01-06 DIAGNOSIS — R110 Nausea: Secondary | ICD-10-CM | POA: Diagnosis not present

## 2022-01-06 DIAGNOSIS — Z3689 Encounter for other specified antenatal screening: Secondary | ICD-10-CM | POA: Insufficient documentation

## 2022-01-06 DIAGNOSIS — R519 Headache, unspecified: Secondary | ICD-10-CM | POA: Insufficient documentation

## 2022-01-06 DIAGNOSIS — O98819 Other maternal infectious and parasitic diseases complicating pregnancy, unspecified trimester: Secondary | ICD-10-CM

## 2022-01-06 LAB — URINALYSIS, ROUTINE W REFLEX MICROSCOPIC
Bilirubin Urine: NEGATIVE
Glucose, UA: NEGATIVE mg/dL
Hgb urine dipstick: NEGATIVE
Ketones, ur: 5 mg/dL — AB
Nitrite: NEGATIVE
Protein, ur: NEGATIVE mg/dL
Specific Gravity, Urine: 1.023 (ref 1.005–1.030)
pH: 5 (ref 5.0–8.0)

## 2022-01-06 LAB — WET PREP, GENITAL
Sperm: NONE SEEN
Trich, Wet Prep: NONE SEEN
WBC, Wet Prep HPF POC: <10 (ref <10)
Yeast Wet Prep HPF POC: NONE SEEN

## 2022-01-06 MED ORDER — ONDANSETRON 4 MG PO TBDP
8.0000 mg | ORAL_TABLET | Freq: Once | ORAL | Status: AC
  Administered 2022-01-06: 8 mg via ORAL
  Filled 2022-01-06: qty 2

## 2022-01-06 MED ORDER — ACETAMINOPHEN 500 MG PO TABS
1000.0000 mg | ORAL_TABLET | Freq: Once | ORAL | Status: AC
  Administered 2022-01-06: 1000 mg via ORAL
  Filled 2022-01-06: qty 2

## 2022-01-06 MED ORDER — METRONIDAZOLE 500 MG PO TABS: 500.0000 mg | ORAL_TABLET | Freq: Two times a day (BID) | ORAL | 0 refills | Status: DC

## 2022-01-06 MED ORDER — TERCONAZOLE 0.8 % VA CREA: 1.0000 | TOPICAL_CREAM | Freq: Every day | VAGINAL | 0 refills | Status: DC

## 2022-01-06 MED ORDER — CYCLOBENZAPRINE HCL 5 MG PO TABS
10.0000 mg | ORAL_TABLET | Freq: Once | ORAL | Status: DC
  Filled 2022-01-06: qty 2

## 2022-01-06 NOTE — MAU Note (Signed)
Leslie Ali is a 31 y.o. at [redacted]w[redacted]d here in MAU reporting: been having a lot of pelvic pain, started 4 days ago, getting worse.  Has been nauseated and has a headache. No bleeding or water leaking. Denies visual changes, epigastric pain or increase in swelling.  +FM ? ?Onset of complaint: 4days agp ?Pain score: 7/10, HA 7/10 ?Vitals:  ? 01/06/22 1208  ?BP: 139/75  ?Pulse: 82  ?Resp: 20  ?Temp: 97.9 ?F (36.6 ?C)  ?SpO2: 98%  ?   ?FHT:154 ?Lab orders placed from triage:  UA ?

## 2022-01-06 NOTE — MAU Provider Note (Signed)
?History  ?  ? ?CSN: [id number] ? ?Arrival date and time: 01/06/22 1149 ? ? Event Date/Time  ? First Provider Initiated Contact with Patient 01/06/22 1252   ?  ? ?Chief Complaint  ?Patient presents with  ? Pelvic Pain  ? Headache  ? Nausea  ? ?Leslie Ali is a 31 y.o. [phone] at [redacted]w[redacted]d who receives care at [facility]  She presents  today for Pelvic Pain, Headache, and Nausea.  She reports she has been having pelvic pain for the past 4 days that is constant and "in between my private and feels like I am pulling something."  She reports it is worsened with walking and fetal movement.  She reports no relieving factors and rates the pain a 7/10.  She also reports HA that have been ongoing for "a week or two."  She reports taking tylenol with temporary relief of symptoms.  She endorses current HA located in her forehead and she describes as throbbing.  She rates a 6/10 and reports she has not taken any tylenol.  She states she hasn't been eating much due to nausea and is currently nauseous today.  She endorses fetal movement and denies abdominal cramping or contractions.  ? ? ?OB History   ? ? Gravida  ?4  ? Para  ?3  ? Term  ?2  ? Preterm  ?1  ? AB  ?   ? Living  ?3  ?  ? ? SAB  ?   ? IAB  ?   ? Ectopic  ?   ? Multiple  ?   ? Live Births  ?3  ?   ?  ?  ? ? ?Past Medical History:  ?Diagnosis Date  ? Anemia   ? Depression   ? Pregnancy induced hypertension   ? ? ?Past Surgical History:  ?Procedure Laterality Date  ? CESAREAN SECTION    ? ? ?Family History  ?Problem Relation Age of Onset  ? COPD Mother   ? Asthma Mother   ? Alcohol abuse Mother   ? Hypertension Father   ? Intellectual disability Sister   ? ADD / ADHD Son   ? Stroke Paternal Grandmother   ? Hypertension Paternal Grandmother   ? Heart disease Paternal Grandmother   ? ? ?Social History  ? ?Tobacco Use  ? Smoking status: Never  ? Smokeless tobacco: Never  ?Vaping Use  ? Vaping Use: Never used  ?Substance Use Topics  ? Alcohol use: Never  ? Drug use: Never   ? ? ?Allergies: No Known Allergies ? ?Medications Prior to Admission  ?Medication Sig Dispense Refill Last Dose  ? labetalol (NORMODYNE) 200 MG tablet Take 1 tablet (200 mg total) by mouth 2 (two) times daily. 60 tablet 3   ? prenatal vitamin w/FE, FA (PRENATAL 1 + 1) 27-1 MG TABS tablet Take 1 tablet by mouth daily at 12 noon. 30 tablet 11   ? ? ?Review of Systems  ?Constitutional:  Negative for chills and fever.  ?Gastrointestinal:  Positive for nausea. Negative for abdominal pain, constipation, diarrhea and vomiting.  ?Genitourinary:  Positive for pelvic pain. Negative for difficulty urinating, dysuria, vaginal bleeding and vaginal discharge.  ?Neurological:  Positive for headaches. Negative for dizziness and light-headedness.  ?Physical Exam  ? ?Blood pressure 137/66, pulse 81, temperature 97.9 ?F (36.6 ?C), temperature source Oral, resp. rate 20, height 5\' 3"  (1.6 m), weight 134.9 kg, last menstrual period 04/27/2021, SpO2 98 %. ? ?Physical Exam ?Vitals reviewed. Exam conducted with a  chaperone present.  ?Constitutional:   ?   General: She is not in acute distress. ?   Appearance: She is well-developed. She is obese. She is not toxic-appearing.  ?HENT:  ?   Head: Normocephalic and atraumatic.  ?Cardiovascular:  ?   Rate and Rhythm: Normal rate and regular rhythm.  ?Pulmonary:  ?   Effort: Pulmonary effort is normal.  ?Abdominal:  ?   Palpations: Abdomen is soft.  ?Genitourinary: ?   Comments: Wet prep collected blindly ?Cervical Exam: Closed ?Greenish curdy discharge noted on exam glove.  ?Musculoskeletal:     ?   General: Normal range of motion.  ?   Cervical back: Normal range of motion.  ?Skin: ?   General: Skin is warm and dry.  ?Neurological:  ?   Mental Status: She is alert and oriented to person, place, and time.  ?Psychiatric:     ?   Mood and Affect: Mood normal.     ?   Behavior: Behavior normal.  ? ? ?Fetal Assessment ?135 bpm, Mod Var, -Decels, +Accels ?Toco: No ctx graphed ? ?MAU Course   ? ?Results for orders placed or performed during the hospital encounter of 01/06/22 (from the past 24 hour(s))  ?Urinalysis, Routine w reflex microscopic Urine, Clean Catch     Status: Abnormal  ? Collection Time: 01/06/22 12:22 PM  ?Result Value Ref Range  ? Color, Urine AMBER (A) YELLOW  ? APPearance CLOUDY (A) CLEAR  ? Specific Gravity, Urine 1.023 1.005 - 1.030  ? pH 5.0 5.0 - 8.0  ? Glucose, UA NEGATIVE NEGATIVE mg/dL  ? Hgb urine dipstick NEGATIVE NEGATIVE  ? Bilirubin Urine NEGATIVE NEGATIVE  ? Ketones, ur 5 (A) NEGATIVE mg/dL  ? Protein, ur NEGATIVE NEGATIVE mg/dL  ? Nitrite NEGATIVE NEGATIVE  ? Leukocytes,Ua LARGE (A) NEGATIVE  ? RBC / HPF 0-5 0 - 5 RBC/hpf  ? WBC, UA 11-20 0 - 5 WBC/hpf  ? Bacteria, UA MANY (A) NONE SEEN  ? Squamous Epithelial / LPF 11-20 0 - 5  ? Mucus PRESENT   ?Wet prep, genital     Status: Abnormal  ? Collection Time: 01/06/22  1:05 PM  ? Specimen: Vaginal  ?Result Value Ref Range  ? Yeast Wet Prep HPF POC NONE SEEN NONE SEEN  ? Trich, Wet Prep NONE SEEN NONE SEEN  ? Clue Cells Wet Prep HPF POC PRESENT (A) NONE SEEN  ? WBC, Wet Prep HPF POC <10 <10  ? Sperm NONE SEEN   ? ?No results found. ? ?MDM ?PE ?Labs:  UA, UC, Wet Prep ?EFM ?Pain Medication ?Assessment and Plan  ?31 year old [id number]  ?SIUP at 36.2 weeks ?Cat I FT ?Headache ?Pelvic Pain ?Nausea ? ?-POC Reviewed ?-Cultures collected and pending.  ?-BME performed. ?-Informed that findings suggestive of yeast infection and will treat accordingly. ?-Also informed that wet prep will be sent in order to rule out other vaginal infections. ?-NST reactive ?-Discussed treatment of headache and relief of pelvic pain with medication.  Patient agreeable. ?-Tylenol and Flexeril ordered. ?-Await results and will return to reassess. ?-Patient with questions regarding preadmission testing. Informed that this provider could not give definitive direction on when and where to go for testing. Advised to wait for preadmission nurse to call with  information. ? ?Cherre RobinsJessica L Eoin Willden MSN, CNM ?01/06/2022, 12:52 PM  ? ?Reassessment (2:08 PM) ? ?-Results as above. ?-Patient reports improvement in pain with tylenol dosing. ?-Patient refused flexeril dosing.  ?-Discussed findings and treatment of yeast  and BV. ?-Patient opts for oral treatment of BV.  Instructed to start yeast treatment after completion of or in conjunction with BV treatment to avoid additional infections.  ?-Instructed to keep next appointment as scheduled. ?-Encouraged to call primary office or return to MAU if symptoms worsen or with the onset of new symptoms. ?-Discharged to home in improved condition. ? ?Cherre Robins MSN, CNM ?Advanced Practice Provider, [facility] ? ?

## 2022-01-07 ENCOUNTER — Encounter (HOSPITAL_COMMUNITY): Payer: Self-pay

## 2022-01-08 ENCOUNTER — Other Ambulatory Visit: Payer: Self-pay

## 2022-01-08 DIAGNOSIS — O2343 Unspecified infection of urinary tract in pregnancy, third trimester: Secondary | ICD-10-CM | POA: Insufficient documentation

## 2022-01-08 LAB — CULTURE, OB URINE: Culture: >=100000 — AB

## 2022-01-08 MED ORDER — CEFDINIR 300 MG PO CAPS: 300.0000 mg | ORAL_CAPSULE | Freq: Two times a day (BID) | ORAL | 0 refills | Status: DC

## 2022-01-09 ENCOUNTER — Other Ambulatory Visit: Payer: Self-pay | Admitting: Certified Nurse Midwife

## 2022-01-10 ENCOUNTER — Ambulatory Visit: Payer: Medicaid Other | Admitting: *Deleted

## 2022-01-10 ENCOUNTER — Ambulatory Visit (INDEPENDENT_AMBULATORY_CARE_PROVIDER_SITE_OTHER): Payer: Medicaid Other | Admitting: Family Medicine

## 2022-01-10 ENCOUNTER — Ambulatory Visit: Payer: Medicaid Other | Attending: Maternal & Fetal Medicine

## 2022-01-10 ENCOUNTER — Other Ambulatory Visit: Payer: Self-pay

## 2022-01-10 ENCOUNTER — Encounter: Payer: Self-pay | Admitting: *Deleted

## 2022-01-10 ENCOUNTER — Encounter: Payer: Medicaid Other | Admitting: Family Medicine

## 2022-01-10 ENCOUNTER — Other Ambulatory Visit (HOSPITAL_COMMUNITY)
Admission: RE | Admit: 2022-01-10 | Discharge: 2022-01-10 | Disposition: A | Discharge status: Home / Self Care | Payer: Medicaid Other | Source: Ambulatory Visit | Attending: Family Medicine | Admitting: Family Medicine

## 2022-01-10 VITALS — BP 135/83 | HR 69 | Wt 296.8 lb

## 2022-01-10 VITALS — BP 138/80 | HR 79

## 2022-01-10 DIAGNOSIS — O09293 Supervision of pregnancy with other poor reproductive or obstetric history, third trimester: Secondary | ICD-10-CM

## 2022-01-10 DIAGNOSIS — O99210 Obesity complicating pregnancy, unspecified trimester: Secondary | ICD-10-CM

## 2022-01-10 DIAGNOSIS — Z8759 Personal history of other complications of pregnancy, childbirth and the puerperium: Secondary | ICD-10-CM

## 2022-01-10 DIAGNOSIS — O09899 Supervision of other high risk pregnancies, unspecified trimester: Secondary | ICD-10-CM | POA: Insufficient documentation

## 2022-01-10 DIAGNOSIS — O99213 Obesity complicating pregnancy, third trimester: Secondary | ICD-10-CM

## 2022-01-10 DIAGNOSIS — Z98891 History of uterine scar from previous surgery: Secondary | ICD-10-CM | POA: Diagnosis present

## 2022-01-10 DIAGNOSIS — Z3A36 36 weeks gestation of pregnancy: Secondary | ICD-10-CM

## 2022-01-10 DIAGNOSIS — E669 Obesity, unspecified: Secondary | ICD-10-CM

## 2022-01-10 DIAGNOSIS — O10919 Unspecified pre-existing hypertension complicating pregnancy, unspecified trimester: Secondary | ICD-10-CM

## 2022-01-10 DIAGNOSIS — O34219 Maternal care for unspecified type scar from previous cesarean delivery: Secondary | ICD-10-CM | POA: Diagnosis not present

## 2022-01-10 DIAGNOSIS — O0990 Supervision of high risk pregnancy, unspecified, unspecified trimester: Secondary | ICD-10-CM | POA: Insufficient documentation

## 2022-01-10 DIAGNOSIS — Z6841 Body Mass Index (BMI) 40.0 and over, adult: Secondary | ICD-10-CM | POA: Insufficient documentation

## 2022-01-10 DIAGNOSIS — O2343 Unspecified infection of urinary tract in pregnancy, third trimester: Secondary | ICD-10-CM

## 2022-01-10 DIAGNOSIS — Z3009 Encounter for other general counseling and advice on contraception: Secondary | ICD-10-CM

## 2022-01-10 DIAGNOSIS — O10013 Pre-existing essential hypertension complicating pregnancy, third trimester: Secondary | ICD-10-CM

## 2022-01-10 DIAGNOSIS — O10913 Unspecified pre-existing hypertension complicating pregnancy, third trimester: Secondary | ICD-10-CM | POA: Insufficient documentation

## 2022-01-10 LAB — OB RESULTS CONSOLE GC/CHLAMYDIA: Gonorrhea: NEGATIVE

## 2022-01-10 NOTE — Progress Notes (Signed)
? ?  PRENATAL VISIT NOTE ? ?Subjective:  ?Leslie Ali is a 31 y.o. [phone] at [redacted]w[redacted]d being seen today for ongoing prenatal care.  She is currently monitored for the following issues for this high-risk pregnancy and has History of pre-eclampsia; Supervision of high risk pregnancy, antepartum; Short interval between pregnancies complicating pregnancy, antepartum; Anxiety and depression; BMI 50.0-59.9, adult ([place]); Seasonal allergies; Chronic hypertension during pregnancy, antepartum; Maternal obesity affecting pregnancy, antepartum; Previous cesarean delivery affecting pregnancy; Pelvic adhesions; Unwanted fertility; and Urinary tract infection in pregnancy in third trimester, antepartum on their problem list. ? ?Patient reports no complaints.  Contractions: Not present. Vag. Bleeding: None.  Movement: Present. Denies leaking of fluid.  ? ?The following portions of the patient's history were reviewed and updated as appropriate: allergies, current medications, past family history, past medical history, past social history, past surgical history and problem list.  ? ?Objective:  ? ?Vitals:  ? 01/10/22 0823  ?BP: 135/83  ?Pulse: 69  ?Weight: 296 lb 12.8 oz (134.6 kg)  ? ? ?Fetal Status: Fetal Heart Rate (bpm): 140   Movement: Present    ? ?General:  Alert, oriented and cooperative. Patient is in no acute distress.  ?Skin: Skin is warm and dry. No rash noted.   ?Cardiovascular: Normal heart rate noted  ?Respiratory: Normal respiratory effort, no problems with respiration noted  ?Abdomen: Soft, gravid, appropriate for gestational age.  Pain/Pressure: Present     ?Pelvic: Cervical exam deferred        ?Extremities: Normal range of motion.  Edema: None  ?Mental Status: Normal mood and affect. Normal behavior. Normal judgment and thought content.  ? ?Assessment and Plan:  ?Pregnancy: [id number] at [redacted]w[redacted]d ?1. Chronic hypertension during pregnancy, antepartum ?BP is well controlled on Labetalol ?On ASA ?Baby is LGA ? ?2.  Supervision of high risk pregnancy, antepartum ?Declines GBS--having a C-section ?- GC/Chlamydia probe amp ([place])not at [facility] ? ?3. Previous cesarean delivery affecting pregnancy ?Scheduled for RCS ? ?4. Urinary tract infection in pregnancy in third trimester, antepartum ?To fill and treat with Abx--risks reviewed ? ?5. Unwanted fertility ?For BTL ? ?Term labor symptoms and general obstetric precautions including but not limited to vaginal bleeding, contractions, leaking of fluid and fetal movement were reviewed in detail with the patient. ?Please refer to After Visit Summary for other counseling recommendations.  ? ?Return in 4 weeks (on 02/07/2022) for pp check. ? ?Future Appointments  ?Date Time Provider [place]  ?01/10/2022  9:45 AM [facility] NURSE [facility]  ?01/10/2022 10:00 AM [facility] US1 [facility]US [facility]  ?01/16/2022  9:00 AM MC-LD PAT 1 MC-INDC None  ?01/17/2022  9:45 AM [facility] NURSE [facility]  ?01/17/2022 10:00 AM [facility]-MFC US1 [facility]-M[facility]US [facility]  ? ? ?Donnamae Jude, MD ? ?

## 2022-01-10 NOTE — Patient Instructions (Signed)

## 2022-01-11 LAB — GC/CHLAMYDIA PROBE AMP (~~LOC~~) NOT AT ARMC
Chlamydia: NEGATIVE
Comment: NEGATIVE
Comment: NORMAL
Neisseria Gonorrhea: NEGATIVE

## 2022-01-16 ENCOUNTER — Encounter (HOSPITAL_COMMUNITY): Payer: Self-pay | Admitting: Obstetrics & Gynecology

## 2022-01-16 ENCOUNTER — Other Ambulatory Visit: Payer: Self-pay

## 2022-01-16 ENCOUNTER — Other Ambulatory Visit (HOSPITAL_COMMUNITY)
Admission: RE | Admit: 2022-01-16 | Discharge: 2022-01-16 | Disposition: A | Discharge status: Home / Self Care | Payer: Medicaid Other | Source: Ambulatory Visit | Attending: Obstetrics and Gynecology | Admitting: Obstetrics and Gynecology

## 2022-01-16 ENCOUNTER — Inpatient Hospital Stay (HOSPITAL_COMMUNITY)
Admission: AD | Admit: 2022-01-16 | Discharge: 2022-01-16 | Disposition: A | Discharge status: Home / Self Care | Payer: Medicaid Other | Attending: Obstetrics & Gynecology | Admitting: Obstetrics & Gynecology

## 2022-01-16 DIAGNOSIS — Z3A37 37 weeks gestation of pregnancy: Secondary | ICD-10-CM | POA: Insufficient documentation

## 2022-01-16 DIAGNOSIS — O10013 Pre-existing essential hypertension complicating pregnancy, third trimester: Secondary | ICD-10-CM | POA: Diagnosis not present

## 2022-01-16 DIAGNOSIS — R519 Headache, unspecified: Secondary | ICD-10-CM | POA: Diagnosis present

## 2022-01-16 DIAGNOSIS — Z3A00 Weeks of gestation of pregnancy not specified: Secondary | ICD-10-CM | POA: Diagnosis not present

## 2022-01-16 DIAGNOSIS — Z3689 Encounter for other specified antenatal screening: Secondary | ICD-10-CM

## 2022-01-16 DIAGNOSIS — O34219 Maternal care for unspecified type scar from previous cesarean delivery: Secondary | ICD-10-CM | POA: Insufficient documentation

## 2022-01-16 DIAGNOSIS — O10919 Unspecified pre-existing hypertension complicating pregnancy, unspecified trimester: Secondary | ICD-10-CM

## 2022-01-16 HISTORY — DX: Other specified postprocedural states: Z98.890

## 2022-01-16 HISTORY — DX: Supervision of pregnancy with other poor reproductive or obstetric history, unspecified trimester: O09.299

## 2022-01-16 HISTORY — DX: Nausea with vomiting, unspecified: R11.2

## 2022-01-16 LAB — CBC
HCT: 35.9 % — ABNORMAL LOW (ref 36.0–46.0)
Hemoglobin: 11 g/dL — ABNORMAL LOW (ref 12.0–15.0)
MCH: 22.7 pg — ABNORMAL LOW (ref 26.0–34.0)
MCHC: 30.6 g/dL (ref 30.0–36.0)
MCV: 74.2 fL — ABNORMAL LOW (ref 80.0–100.0)
Platelets: 324 10*3/uL (ref 150–400)
RBC: 4.84 MIL/uL (ref 3.87–5.11)
RDW: 17.2 % — ABNORMAL HIGH (ref 11.5–15.5)
WBC: 9.7 10*3/uL (ref 4.0–10.5)
nRBC: 0 % (ref 0.0–0.2)

## 2022-01-16 LAB — PROTEIN / CREATININE RATIO, URINE
Creatinine, Urine: 190.21 mg/dL
Protein Creatinine Ratio: 0.11 mg/mg{Cre} (ref 0.00–0.15)
Total Protein, Urine: 21 mg/dL

## 2022-01-16 LAB — COMPREHENSIVE METABOLIC PANEL
ALT: 24 U/L (ref 0–44)
AST: 29 U/L (ref 15–41)
Albumin: 2.5 g/dL — ABNORMAL LOW (ref 3.5–5.0)
Alkaline Phosphatase: 110 U/L (ref 38–126)
Anion gap: 11 (ref 5–15)
BUN: 7 mg/dL (ref 6–20)
CO2: 21 mmol/L — ABNORMAL LOW (ref 22–32)
Calcium: 8.6 mg/dL — ABNORMAL LOW (ref 8.9–10.3)
Chloride: 106 mmol/L (ref 98–111)
Creatinine, Ser: 0.71 mg/dL (ref 0.44–1.00)
GFR, Estimated: >60 mL/min (ref >60)
Glucose, Bld: 114 mg/dL — ABNORMAL HIGH (ref 70–99)
Potassium: 3.6 mmol/L (ref 3.5–5.1)
Sodium: 138 mmol/L (ref 135–145)
Total Bilirubin: 0.4 mg/dL (ref 0.3–1.2)
Total Protein: 6.1 g/dL — ABNORMAL LOW (ref 6.5–8.1)

## 2022-01-16 LAB — RPR: RPR Ser Ql: NONREACTIVE

## 2022-01-16 MED ORDER — LACTATED RINGERS IV SOLN: Freq: Once | INTRAVENOUS | Status: AC

## 2022-01-16 MED ORDER — DEXAMETHASONE SODIUM PHOSPHATE 10 MG/ML IJ SOLN
10.0000 mg | Freq: Once | INTRAMUSCULAR | Status: AC
Start: 2022-01-16 — End: 2022-01-16
  Administered 2022-01-16: 10 mg via INTRAVENOUS
  Filled 2022-01-16: qty 1

## 2022-01-16 MED ORDER — LABETALOL HCL 5 MG/ML IV SOLN: 40.0000 mg | INTRAVENOUS | Status: DC | PRN

## 2022-01-16 MED ORDER — PROCHLORPERAZINE EDISYLATE 10 MG/2ML IJ SOLN
10.0000 mg | Freq: Four times a day (QID) | INTRAMUSCULAR | Status: DC | PRN
Start: 2022-01-16 — End: 2022-01-16
  Administered 2022-01-16: 10 mg via INTRAVENOUS
  Filled 2022-01-16 (×2): qty 2

## 2022-01-16 MED ORDER — LABETALOL HCL 5 MG/ML IV SOLN: 80.0000 mg | INTRAVENOUS | Status: DC | PRN

## 2022-01-16 MED ORDER — HYDRALAZINE HCL 20 MG/ML IJ SOLN: 10.0000 mg | INTRAMUSCULAR | Status: DC | PRN

## 2022-01-16 MED ORDER — LABETALOL HCL 5 MG/ML IV SOLN: 20.0000 mg | INTRAVENOUS | Status: DC | PRN

## 2022-01-16 NOTE — MAU Provider Note (Signed)
?History  ?  ? ?CSN: [id number] ? ?Arrival date and time: 01/16/22 [id number] ? ? Event Date/Time  ? First Provider Initiated Contact with Patient 01/16/22 1005   ?  ? ?Chief Complaint  ?Patient presents with  ? Headache  ? Hypertension  ? ?HPI ?Leslie Ali is a 30 y.o. [phone] at [redacted]w[redacted]d who presents to MAU form ob surgical pre-op. She was noted to have a persistent headache in the setting of Chronic Hypertension, well controlled on Labetalol 200 mg BID. On arrival to MAU patient reports anterior bilateral headache. Pain score is 8/10. She denies aggravating or alleviating factors. She denies spots in her field of vision, RUQ/epigastric pain, new onset swelling or weight gain. She denies vaginal bleeding, leaking of fluid, decreased fetal movement, fever, falls, or recent illness.  ? ?Patient took 3 small sips of Dr. Malachi Bonds around 0800 to swallow her Labetalol. She has otherwise been NPO since 1730 last night. ? ?Patient is scheduled for repeat cesarean on 01/18/2022.  ? ?OB History   ? ? Gravida  ?4  ? Para  ?3  ? Term  ?2  ? Preterm  ?1  ? AB  ?   ? Living  ?3  ?  ? ? SAB  ?   ? IAB  ?   ? Ectopic  ?   ? Multiple  ?   ? Live Births  ?3  ?   ?  ?  ? ? ?Past Medical History:  ?Diagnosis Date  ? Anemia   ? Depression   ? History of pre-eclampsia in prior pregnancy, currently pregnant   ? PONV (postoperative nausea and vomiting)   ? severe nausea and vomitting after spinals with all CS  ? Pregnancy induced hypertension   ? ? ?Past Surgical History:  ?Procedure Laterality Date  ? CESAREAN SECTION    ? ? ?Family History  ?Problem Relation Age of Onset  ? COPD Mother   ? Asthma Mother   ? Alcohol abuse Mother   ? Hypertension Father   ? Intellectual disability Sister   ? ADD / ADHD Son   ? Stroke Paternal Grandmother   ? Hypertension Paternal Grandmother   ? Heart disease Paternal Grandmother   ? ? ?Social History  ? ?Tobacco Use  ? Smoking status: Never  ? Smokeless tobacco: Never  ?Vaping Use  ? Vaping Use: Never used   ?Substance Use Topics  ? Alcohol use: Never  ? Drug use: Never  ? ? ?Allergies: No Known Allergies ? ?No medications prior to admission.  ? ? ?Review of Systems  ?Neurological:  Positive for headaches.  ?All other systems reviewed and are negative. ?Physical Exam  ? ?Blood pressure (!) 144/75, pulse 70, temperature 97.9 ?F (36.6 ?C), temperature source Oral, resp. rate 16, last menstrual period 04/27/2021, SpO2 99 %. ? ?Physical Exam ?Vitals and nursing note reviewed.  ?Constitutional:   ?   Appearance: She is well-developed.  ?Cardiovascular:  ?   Rate and Rhythm: Normal rate and regular rhythm.  ?   Heart sounds: Normal heart sounds.  ?Pulmonary:  ?   Effort: Pulmonary effort is normal.  ?   Breath sounds: Normal breath sounds.  ?Abdominal:  ?   Comments: Gravid  ?Skin: ?   Capillary Refill: Capillary refill takes less than 2 seconds.  ?Neurological:  ?   Mental Status: She is alert and oriented to person, place, and time.  ?Psychiatric:     ?   Mood and Affect: Mood  normal.     ?   Speech: Speech normal.     ?   Behavior: Behavior normal.  ? ? ?MAU Course  ?Procedures ? ?MDM ? ?--EMR reviewed. Patient reports maroon extra large cuff is used during prenatal vital signs. Identical XL cuff used on arrival to MAU for consistency. Severe BP x 1 noted with use of this cuff. Cuff noted to be too small for upper arm and too large for lower arm per guidelines on cuff itself. Cuff changed to regular cuff on patient forearm at 1115 ? ?--Headache initially 8/10. Resolved with IV medication in MAU ? ?--No severe bp with appropriately sized cuff. No severe symptoms reported by patient during MAU encounter ? ?--Reactive tracing: baseline 145, mod var, + accels, no decels ? ?--Toco: occasional UI otherwise quiet ? ?--CBC and CMET collected by Pre-op team. Visible in chart and WNL ? ?--Given plan for repeat cesarean, care coordinated with Dr. Si Raider ? ?Patient Vitals for the past 24 hrs: ? BP Temp Temp src Pulse Resp SpO2   ?01/16/22 1133 (!) 144/75 -- -- 70 16 --  ?01/16/22 1115 135/68 -- -- 77 -- 99 %  ?01/16/22 1101 (!) 112/92 -- -- (!) 168 -- --  ?01/16/22 1055 -- -- -- -- -- 100 %  ?01/16/22 1054 103/73 -- -- 80 -- --  ?01/16/22 1040 -- -- -- -- -- 100 %  ?01/16/22 1035 -- -- -- -- -- 99 %  ?01/16/22 1031 (!) 167/124 -- -- 65 -- --  ?01/16/22 1030 -- -- -- -- -- 99 %  ?01/16/22 1025 -- -- -- -- -- 99 %  ?01/16/22 1020 -- -- -- -- -- 99 %  ?01/16/22 1016 (!) 150/80 -- -- 87 -- --  ?01/16/22 1015 -- -- -- -- -- 99 %  ?01/16/22 1010 -- -- -- -- -- 99 %  ?01/16/22 1005 -- -- -- -- -- 97 %  ?01/16/22 1003 138/68 97.9 ?F (36.6 ?C) Oral 84 20 97 %  ?01/16/22 1001 (!) 131/50 -- -- 78 -- --  ? ?Results for orders placed or performed during the hospital encounter of 01/16/22 (from the past 24 hour(s))  ?Protein / creatinine ratio, urine     Status: None  ? Collection Time: 01/16/22  9:50 AM  ?Result Value Ref Range  ? Creatinine, Urine 190.21 mg/dL  ? Total Protein, Urine 21 mg/dL  ? Protein Creatinine Ratio 0.11 0.00 - 0.15 mg/mg[Cre]  ? ?Meds ordered this encounter  ?Medications  ? lactated ringers infusion  ? prochlorperazine (COMPAZINE) injection 10 mg  ? dexamethasone (DECADRON) injection 10 mg  ? AND Linked Order Group  ?  labetalol (NORMODYNE) injection 20 mg  ?  labetalol (NORMODYNE) injection 40 mg  ?  labetalol (NORMODYNE) injection 80 mg  ?  hydrALAZINE (APRESOLINE) injection 10 mg  ? ?Assessment and Plan  ?--31 y.o. [id number] at [redacted]w[redacted]d  ?--CHTN on Labetalol 200 mg BID ?--PEC labs WNL ?--Headache resolved  ?--Discharge home in stable condition s/p discussion with Dr. Si Raider ? ?F/U: ?Patient scheduled for repeat cesarean with BTL on 01/18/2022 ? ?Darlina Rumpf, MSA, MSN, CNM ?01/16/2022, 1:16 PM  ?

## 2022-01-16 NOTE — Pre-Procedure Instructions (Signed)
BP 142/80 pt c/o headache  no blurred vision or epigastric pain.  Dr Annia Friendly notified Has not taken orders to go to MAU for evaluation.  CBC and CMP upgraded to stat ?

## 2022-01-16 NOTE — MAU Note (Cosign Needed Addendum)
Chief Complaint:  Headache and Hypertension ? ? Event Date/Time  ? First Provider Initiated Contact with Patient 01/16/22 1005   ?  ?HPI: Leslie Ali is a 31 y.o. [phone] at [redacted]w[redacted]d with a Hx of cHTN who presents to maternity admissions from pre-op reporting a 2 week long headache. Denies vaginal bleeding, leaking of fluid, decreased fetal movement, fever, falls, or recent illness. ? ?The patient reports having a persistent 8/10 headache for the past 2 weeks. She has a history of migraines before pregnancy, but has not had any recently. She has been treating the headache with Tylenol, which brings moderate relief normally. She has not taken any Tylenol this morning. ? ?This morning, she took her labetalol and had a couple of sips of Dr. Malachi Bonds at 8 am before her pre-op appointment. She states she is nervous because she was not expecting to deliver any time soon. ? ?Location: Forehead, symmetrical ?Quality: Throbbing ?Severity: 8/10 in pain scale ?Duration: 2 weeks ?Timing: Constant, relieved to 4/10 by daily Tylenol ?Modifying factors: Past Hx of migraines ?Associated signs and symptoms: 1+ pitting extremity edema bilaterally ? ?Pregnancy Course: ? ?Past Medical History:  ?Diagnosis Date  ? Anemia   ? Depression   ? History of pre-eclampsia in prior pregnancy, currently pregnant   ? PONV (postoperative nausea and vomiting)   ? severe nausea and vomitting after spinals with all CS  ? Pregnancy induced hypertension   ? ?OB History  ?Gravida Para Term Preterm AB Living  ?4 3 2 1   3   ?SAB IAB Ectopic Multiple Live Births  ?        3  ?  ?# Outcome Date GA Lbr Len/2nd Weight Sex Delivery Anes PTL Lv  ?4 Current           ?3 Preterm 12/20/20 [redacted]w[redacted]d   M CS-Unspec  Y LIV  ?   Complications: Preeclampsia  ?2 Term 05/07/19    F CS-Unspec  N LIV  ?1 Term 08/07/15    M CS-Unspec  N LIV  ? ?Past Surgical History:  ?Procedure Laterality Date  ? CESAREAN SECTION    ? ?Family History  ?Problem Relation Age of Onset  ? COPD  Mother   ? Asthma Mother   ? Alcohol abuse Mother   ? Hypertension Father   ? Intellectual disability Sister   ? ADD / ADHD Son   ? Stroke Paternal Grandmother   ? Hypertension Paternal Grandmother   ? Heart disease Paternal Grandmother   ? ?Social History  ? ?Tobacco Use  ? Smoking status: Never  ? Smokeless tobacco: Never  ?Vaping Use  ? Vaping Use: Never used  ?Substance Use Topics  ? Alcohol use: Never  ? Drug use: Never  ? ?No Known Allergies ?Medications Prior to Admission  ?Medication Sig Dispense Refill Last Dose  ? cefdinir (OMNICEF) 300 MG capsule Take 1 capsule (300 mg total) by mouth 2 (two) times daily. 14 capsule 0 01/16/2022  ? labetalol (NORMODYNE) 200 MG tablet Take 1 tablet (200 mg total) by mouth 2 (two) times daily. 60 tablet 3 01/16/2022 at 0820  ? prenatal vitamin w/FE, FA (PRENATAL 1 + 1) 27-1 MG TABS tablet Take 1 tablet by mouth daily at 12 noon. 30 tablet 11 01/16/2022  ? metroNIDAZOLE (FLAGYL) 500 MG tablet Take 1 tablet (500 mg total) by mouth 2 (two) times daily. 14 tablet 0   ? terconazole (TERAZOL 3) 0.8 % vaginal cream Place 1 applicator vaginally at bedtime. (Patient  not taking: Reported on 01/10/2022) 20 g 0   ? ? ?I have reviewed patient's Past Medical Hx, Surgical Hx, Family Hx, Social Hx, medications and allergies. ? ?ROS:  ?Review of Systems  ?Respiratory:  Negative for chest tightness and shortness of breath.   ?Cardiovascular:  Positive for leg swelling (1+). Negative for chest pain.  ?Gastrointestinal:  Positive for nausea. Negative for vomiting.  ?Genitourinary:  Negative for decreased urine volume and flank pain.  ?Neurological:  Positive for headaches. Negative for dizziness and light-headedness.  ? ?Physical Exam  ?Patient Vitals for the past 24 hrs: ? BP Temp Temp src Pulse Resp SpO2  ?01/16/22 1133 (!) 144/75 -- -- 70 16 --  ?01/16/22 1115 135/68 -- -- 77 -- 99 %  ?01/16/22 1101 (!) 112/92 -- -- (!) 168 -- --  ?01/16/22 1055 -- -- -- -- -- 100 %  ?01/16/22 1054 103/73 -- --  80 -- --  ?01/16/22 1040 -- -- -- -- -- 100 %  ?01/16/22 1035 -- -- -- -- -- 99 %  ?01/16/22 1031 (!) 167/124 -- -- 65 -- --  ?01/16/22 1030 -- -- -- -- -- 99 %  ?01/16/22 1025 -- -- -- -- -- 99 %  ?01/16/22 1020 -- -- -- -- -- 99 %  ?01/16/22 1016 (!) 150/80 -- -- 87 -- --  ?01/16/22 1015 -- -- -- -- -- 99 %  ?01/16/22 1010 -- -- -- -- -- 99 %  ?01/16/22 1005 -- -- -- -- -- 97 %  ?01/16/22 1003 138/68 97.9 ?F (36.6 ?C) Oral 84 20 97 %  ?01/16/22 1001 (!) 131/50 -- -- 78 -- --  ? ? ?Constitutional: Well-developed, well-nourished female in no acute distress, .  ?Cardiovascular: Normal S1/S2, no murmurs, rubs, or gallops ?Respiratory: Normal effort on room air, lung sounds clear throughout ?GI: Abd soft, non-tender, gravid appropriate for gestational age. Pos BS x 4 ?MS: Extremities nontender, 1+ pitting edema bilaterally, normal ROM ?Neurologic: Alert and oriented x 4.   ? ?Fetal Tracing: ?Baseline: 150 bpm ?Variability: 10-15 ?Accelerations: Present ?Decelerations: Absent ?Toco: No contractions ?  ?Labs: ?Results for orders placed or performed during the hospital encounter of 01/16/22 (from the past 24 hour(s))  ?Protein / creatinine ratio, urine     Status: None  ? Collection Time: 01/16/22  9:50 AM  ?Result Value Ref Range  ? Creatinine, Urine 190.21 mg/dL  ? Total Protein, Urine 21 mg/dL  ? Protein Creatinine Ratio 0.11 0.00 - 0.15 mg/mg[Cre]  ? ? ?Imaging:  ?No results found. ? ?MAU Course: ?Orders Placed This Encounter  ?Procedures  ? Protein / creatinine ratio, urine  ? Notify physician (specify) Confirmatory reading of BP> 160/110 15 minutes later  ? Measure blood pressure  ? Discharge patient  ? ?Meds ordered this encounter  ?Medications  ? lactated ringers infusion  ? prochlorperazine (COMPAZINE) injection 10 mg  ? dexamethasone (DECADRON) injection 10 mg  ? AND Linked Order Group  ?  labetalol (NORMODYNE) injection 20 mg  ?  labetalol (NORMODYNE) injection 40 mg  ?  labetalol (NORMODYNE) injection 80 mg  ?   hydrALAZINE (APRESOLINE) injection 10 mg  ? ? ?MDM: ?-Patient arrived to MAU from pre-op d/t persistent 8/10 headache and hypertensive measurements. ?-Labs: Type and cross, CBC, Pr/Cr ratio, and CMP obtained in pre-op. ?-EFM initiated ?-Blood pressure cuff size adjusted to regular size on R forearm d/t body habitus for more accurate readings. ?-Headache treated with Compazine 10 mg IV ? ?Assessment & Plan: ?1.  Chronic hypertension affecting pregnancy   ?2. Headache in pregnancy, antepartum, third trimester   ?3. Non-stress test reactive on fetal surveillance   ? ?-NST reactive and fetal tracing Category 1 ?-Patient reported resolution of headache with Compazine treatment. ?-Pr/Cr normal and other results as above. ?-Given absence of severe features, MD consulted and patient discharged home in stable condition. ?-Patient encouraged to call primary office or return to MAU if symptoms worsen or with the onset of new symptoms. ? ? ?Allergies as of 01/16/2022   ?No Known Allergies ?  ? ?  ?Medication List  ?  ? ?STOP taking these medications   ? ?metroNIDAZOLE 500 MG tablet ?Commonly known as: Flagyl ?  ?terconazole 0.8 % vaginal cream ?Commonly known as: TERAZOL 3 ?  ? ?  ? ?TAKE these medications   ? ?cefdinir 300 MG capsule ?Commonly known as: OMNICEF ?Take 1 capsule (300 mg total) by mouth 2 (two) times daily. ?  ?labetalol 200 MG tablet ?Commonly known as: NORMODYNE ?Take 1 tablet (200 mg total) by mouth 2 (two) times daily. ?  ?prenatal vitamin w/FE, FA 27-1 MG Tabs tablet ?Take 1 tablet by mouth daily at 12 noon. ?  ? ?  ? ? ?Dimitry Mining engineer, Medical Student ?[employer] ? ?Attestation of Supervision of Student:  I confirm that I have verified the information documented in the medical student?s note and that I have also personally reperformed the history, physical exam and all medical decision making activities.  I have verified that all services and findings are accurately documented in this student's  note; and I agree with management and plan as outlined in the documentation. I have also made any necessary editorial changes. ? ?--Student note, see separate MAU provider note ? ?Darlina Rumpf

## 2022-01-17 ENCOUNTER — Encounter: Payer: Self-pay | Admitting: *Deleted

## 2022-01-17 ENCOUNTER — Ambulatory Visit: Payer: Medicaid Other | Admitting: *Deleted

## 2022-01-17 ENCOUNTER — Encounter (HOSPITAL_COMMUNITY): Payer: Self-pay | Admitting: Obstetrics and Gynecology

## 2022-01-17 ENCOUNTER — Ambulatory Visit (HOSPITAL_BASED_OUTPATIENT_CLINIC_OR_DEPARTMENT_OTHER): Payer: Medicaid Other

## 2022-01-17 VITALS — BP 127/82 | HR 77

## 2022-01-17 DIAGNOSIS — Z362 Encounter for other antenatal screening follow-up: Secondary | ICD-10-CM

## 2022-01-17 DIAGNOSIS — O0990 Supervision of high risk pregnancy, unspecified, unspecified trimester: Secondary | ICD-10-CM

## 2022-01-17 DIAGNOSIS — Z3A37 37 weeks gestation of pregnancy: Secondary | ICD-10-CM | POA: Insufficient documentation

## 2022-01-17 DIAGNOSIS — O99213 Obesity complicating pregnancy, third trimester: Secondary | ICD-10-CM | POA: Diagnosis not present

## 2022-01-17 DIAGNOSIS — O34219 Maternal care for unspecified type scar from previous cesarean delivery: Secondary | ICD-10-CM | POA: Insufficient documentation

## 2022-01-17 DIAGNOSIS — Z98891 History of uterine scar from previous surgery: Secondary | ICD-10-CM

## 2022-01-17 DIAGNOSIS — O99210 Obesity complicating pregnancy, unspecified trimester: Secondary | ICD-10-CM

## 2022-01-17 DIAGNOSIS — Z79899 Other long term (current) drug therapy: Secondary | ICD-10-CM | POA: Insufficient documentation

## 2022-01-17 DIAGNOSIS — O10013 Pre-existing essential hypertension complicating pregnancy, third trimester: Secondary | ICD-10-CM

## 2022-01-17 DIAGNOSIS — O09899 Supervision of other high risk pregnancies, unspecified trimester: Secondary | ICD-10-CM

## 2022-01-17 DIAGNOSIS — E669 Obesity, unspecified: Secondary | ICD-10-CM

## 2022-01-17 DIAGNOSIS — O09293 Supervision of pregnancy with other poor reproductive or obstetric history, third trimester: Secondary | ICD-10-CM

## 2022-01-17 DIAGNOSIS — O10913 Unspecified pre-existing hypertension complicating pregnancy, third trimester: Secondary | ICD-10-CM | POA: Insufficient documentation

## 2022-01-17 DIAGNOSIS — Z6841 Body Mass Index (BMI) 40.0 and over, adult: Secondary | ICD-10-CM

## 2022-01-17 DIAGNOSIS — O09893 Supervision of other high risk pregnancies, third trimester: Secondary | ICD-10-CM | POA: Insufficient documentation

## 2022-01-17 DIAGNOSIS — Z8759 Personal history of other complications of pregnancy, childbirth and the puerperium: Secondary | ICD-10-CM

## 2022-01-17 NOTE — Anesthesia Preprocedure Evaluation (Addendum)
Anesthesia Evaluation  ?Patient identified by MRN, date of birth, ID band ?Patient awake ? ? ? ?Reviewed: ?Allergy & Precautions, NPO status , Patient's Chart, lab work & pertinent test results, reviewed documented beta blocker date and time  ? ?History of Anesthesia Complications ?(+) PONV and history of anesthetic complications ? ?Airway ?Mallampati: III ? ?TM Distance: >3 FB ?Neck ROM: Full ? ? ? Dental ?no notable dental hx. ?(+) Teeth Intact, Dental Advisory Given ?  ?Pulmonary ?neg pulmonary ROS,  ?  ?Pulmonary exam normal ?breath sounds clear to auscultation ? ? ? ? ? ? Cardiovascular ?hypertension, Pt. on medications and Pt. on home beta blockers ?Normal cardiovascular exam ?Rhythm:Regular Rate:Normal ? ?cHTN ?  ?Neuro/Psych ?negative neurological ROS ? negative psych ROS  ? GI/Hepatic ?Neg liver ROS, GERD  ,  ?Endo/Other  ?Morbid obesity ? Renal/GU ?negative Renal ROS  ?negative genitourinary ?  ?Musculoskeletal ?negative musculoskeletal ROS ?(+)  ? Abdominal ?(+) + obese,   ?Peds ? Hematology ? ?(+) Blood dyscrasia, anemia ,   ?Anesthesia Other Findings ? ? Reproductive/Obstetrics ?(+) Pregnancy ?Previous C/Section x 3 ?Undesired fertility ? ?  ? ? ? ? ? ? ? ? ? ? ? ? ? ?  ?  ? ? ? ? ? ? ?Anesthesia Physical ?Anesthesia Plan ? ?ASA: 3 ? ?Anesthesia Plan: Combined Spinal and Epidural and Spinal  ? ?Post-op Pain Management:   ? ?Induction:  ? ?PONV Risk Score and Plan: 4 or greater and Diphenhydramine, Scopolamine patch - Pre-op and Treatment may vary due to age or medical condition ? ?Airway Management Planned: Natural Airway ? ?Additional Equipment:  ? ?Intra-op Plan:  ? ?Post-operative Plan: Extubation in OR ? ?Informed Consent: I have reviewed the patients History and Physical, chart, labs and discussed the procedure including the risks, benefits and alternatives for the proposed anesthesia with the patient or authorized representative who has indicated his/her  understanding and acceptance.  ? ? ? ?Dental advisory given ? ?Plan Discussed with: CRNA and Anesthesiologist ? ?Anesthesia Plan Comments:   ? ? ? ? ? ?Anesthesia Quick Evaluation ? ?

## 2022-01-18 ENCOUNTER — Encounter (HOSPITAL_COMMUNITY)
Admission: RE | Disposition: A | Discharge status: Home / Self Care | Payer: Self-pay | Source: Home / Self Care | Attending: Obstetrics and Gynecology

## 2022-01-18 ENCOUNTER — Other Ambulatory Visit: Payer: Self-pay

## 2022-01-18 ENCOUNTER — Inpatient Hospital Stay (HOSPITAL_COMMUNITY)
Admission: RE | Admit: 2022-01-18 | Discharge: 2022-01-20 | DRG: 784 | Disposition: A | Discharge status: Home / Self Care | Payer: Medicaid Other | Attending: Obstetrics and Gynecology | Admitting: Obstetrics and Gynecology

## 2022-01-18 ENCOUNTER — Inpatient Hospital Stay (HOSPITAL_COMMUNITY): Payer: Medicaid Other | Admitting: Anesthesiology

## 2022-01-18 ENCOUNTER — Encounter (HOSPITAL_COMMUNITY): Payer: Self-pay | Admitting: Obstetrics and Gynecology

## 2022-01-18 DIAGNOSIS — Z8759 Personal history of other complications of pregnancy, childbirth and the puerperium: Secondary | ICD-10-CM

## 2022-01-18 DIAGNOSIS — Z3A38 38 weeks gestation of pregnancy: Secondary | ICD-10-CM

## 2022-01-18 DIAGNOSIS — Z3009 Encounter for other general counseling and advice on contraception: Secondary | ICD-10-CM | POA: Diagnosis present

## 2022-01-18 DIAGNOSIS — N736 Female pelvic peritoneal adhesions (postinfective): Secondary | ICD-10-CM | POA: Diagnosis present

## 2022-01-18 DIAGNOSIS — O09899 Supervision of other high risk pregnancies, unspecified trimester: Secondary | ICD-10-CM

## 2022-01-18 DIAGNOSIS — O34211 Maternal care for low transverse scar from previous cesarean delivery: Secondary | ICD-10-CM | POA: Diagnosis present

## 2022-01-18 DIAGNOSIS — O99210 Obesity complicating pregnancy, unspecified trimester: Secondary | ICD-10-CM

## 2022-01-18 DIAGNOSIS — O99214 Obesity complicating childbirth: Secondary | ICD-10-CM | POA: Diagnosis present

## 2022-01-18 DIAGNOSIS — F419 Anxiety disorder, unspecified: Secondary | ICD-10-CM | POA: Diagnosis present

## 2022-01-18 DIAGNOSIS — O10919 Unspecified pre-existing hypertension complicating pregnancy, unspecified trimester: Secondary | ICD-10-CM | POA: Diagnosis present

## 2022-01-18 DIAGNOSIS — Z302 Encounter for sterilization: Secondary | ICD-10-CM | POA: Diagnosis not present

## 2022-01-18 DIAGNOSIS — O3663X0 Maternal care for excessive fetal growth, third trimester, not applicable or unspecified: Secondary | ICD-10-CM | POA: Diagnosis present

## 2022-01-18 DIAGNOSIS — F32A Depression, unspecified: Secondary | ICD-10-CM | POA: Diagnosis present

## 2022-01-18 DIAGNOSIS — O34219 Maternal care for unspecified type scar from previous cesarean delivery: Secondary | ICD-10-CM

## 2022-01-18 DIAGNOSIS — Z98891 History of uterine scar from previous surgery: Secondary | ICD-10-CM

## 2022-01-18 DIAGNOSIS — O1002 Pre-existing essential hypertension complicating childbirth: Secondary | ICD-10-CM | POA: Diagnosis present

## 2022-01-18 DIAGNOSIS — O3660X0 Maternal care for excessive fetal growth, unspecified trimester, not applicable or unspecified: Secondary | ICD-10-CM

## 2022-01-18 DIAGNOSIS — O0990 Supervision of high risk pregnancy, unspecified, unspecified trimester: Secondary | ICD-10-CM

## 2022-01-18 LAB — PREPARE RBC (CROSSMATCH)

## 2022-01-18 LAB — ABO/RH: ABO/RH(D): O POS

## 2022-01-18 SURGERY — Surgical Case
Anesthesia: Spinal

## 2022-01-18 MED ORDER — OXYTOCIN-SODIUM CHLORIDE 30-0.9 UT/500ML-% IV SOLN
INTRAVENOUS | Status: AC
  Filled 2022-01-18: qty 500

## 2022-01-18 MED ORDER — ONDANSETRON HCL 4 MG/2ML IJ SOLN
INTRAMUSCULAR | Status: DC | PRN
  Administered 2022-01-18: 4 mg via INTRAVENOUS

## 2022-01-18 MED ORDER — DIPHENHYDRAMINE HCL 50 MG/ML IJ SOLN
12.5000 mg | INTRAMUSCULAR | Status: DC | PRN
  Administered 2022-01-18: 12.5 mg via INTRAVENOUS
  Filled 2022-01-18: qty 1

## 2022-01-18 MED ORDER — FENTANYL CITRATE (PF) 100 MCG/2ML IJ SOLN
INTRAMUSCULAR | Status: AC
  Filled 2022-01-18: qty 2

## 2022-01-18 MED ORDER — MORPHINE SULFATE (PF) 0.5 MG/ML IJ SOLN
INTRAMUSCULAR | Status: DC | PRN
  Administered 2022-01-18: .15 mg via INTRATHECAL

## 2022-01-18 MED ORDER — DIPHENHYDRAMINE HCL 50 MG/ML IJ SOLN
INTRAMUSCULAR | Status: AC
  Filled 2022-01-18: qty 1

## 2022-01-18 MED ORDER — SODIUM CHLORIDE 0.9 % IV SOLN: 2.0000 g | INTRAVENOUS | Status: DC

## 2022-01-18 MED ORDER — FENTANYL CITRATE (PF) 100 MCG/2ML IJ SOLN
INTRAMUSCULAR | Status: DC | PRN
  Administered 2022-01-18: 15 ug via INTRATHECAL

## 2022-01-18 MED ORDER — MORPHINE SULFATE (PF) 0.5 MG/ML IJ SOLN
INTRAMUSCULAR | Status: AC
  Filled 2022-01-18: qty 10

## 2022-01-18 MED ORDER — ONDANSETRON HCL 4 MG/2ML IJ SOLN: 4.0000 mg | Freq: Three times a day (TID) | INTRAMUSCULAR | Status: DC | PRN

## 2022-01-18 MED ORDER — ACETAMINOPHEN 500 MG PO TABS
ORAL_TABLET | ORAL | Status: AC
  Filled 2022-01-18: qty 2

## 2022-01-18 MED ORDER — POVIDONE-IODINE 10 % EX SWAB: 2.0000 "application " | Freq: Once | CUTANEOUS | Status: DC

## 2022-01-18 MED ORDER — NALOXONE HCL 0.4 MG/ML IJ SOLN: 0.4000 mg | INTRAMUSCULAR | Status: DC | PRN

## 2022-01-18 MED ORDER — MEASLES, MUMPS & RUBELLA VAC IJ SOLR: 0.5000 mL | Freq: Once | INTRAMUSCULAR | Status: DC

## 2022-01-18 MED ORDER — NIFEDIPINE ER OSMOTIC RELEASE 30 MG PO TB24
30.0000 mg | ORAL_TABLET | Freq: Every day | ORAL | Status: DC
Start: 2022-01-19 — End: 2022-01-20
  Administered 2022-01-19 – 2022-01-20 (×2): 30 mg via ORAL
  Filled 2022-01-18 (×2): qty 1

## 2022-01-18 MED ORDER — METOCLOPRAMIDE HCL 5 MG/ML IJ SOLN
INTRAMUSCULAR | Status: DC | PRN
Start: 2022-01-18 — End: 2022-01-18
  Administered 2022-01-18: 10 mg via INTRAVENOUS

## 2022-01-18 MED ORDER — DEXAMETHASONE SODIUM PHOSPHATE 4 MG/ML IJ SOLN
INTRAMUSCULAR | Status: AC
  Filled 2022-01-18: qty 1

## 2022-01-18 MED ORDER — SOD CITRATE-CITRIC ACID 500-334 MG/5ML PO SOLN
ORAL | Status: AC
  Filled 2022-01-18: qty 30

## 2022-01-18 MED ORDER — DEXAMETHASONE SODIUM PHOSPHATE 10 MG/ML IJ SOLN
INTRAMUSCULAR | Status: DC | PRN
  Administered 2022-01-18: 8 mg via INTRAVENOUS

## 2022-01-18 MED ORDER — OXYTOCIN-SODIUM CHLORIDE 30-0.9 UT/500ML-% IV SOLN
INTRAVENOUS | Status: DC | PRN
  Administered 2022-01-18: 400 mL via INTRAVENOUS

## 2022-01-18 MED ORDER — MENTHOL 3 MG MT LOZG: 1.0000 | LOZENGE | OROMUCOSAL | Status: DC | PRN

## 2022-01-18 MED ORDER — LACTATED RINGERS IV SOLN: INTRAVENOUS | Status: DC | PRN

## 2022-01-18 MED ORDER — COCONUT OIL OIL: 1.0000 "application " | TOPICAL_OIL | Status: DC | PRN

## 2022-01-18 MED ORDER — PRENATAL MULTIVITAMIN CH
1.0000 | ORAL_TABLET | Freq: Every day | ORAL | Status: DC
  Administered 2022-01-19 – 2022-01-20 (×2): 1 via ORAL
  Filled 2022-01-18 (×2): qty 1

## 2022-01-18 MED ORDER — KETOROLAC TROMETHAMINE 30 MG/ML IJ SOLN: 30.0000 mg | Freq: Four times a day (QID) | INTRAMUSCULAR | Status: AC | PRN

## 2022-01-18 MED ORDER — PHENYLEPHRINE HCL-NACL 20-0.9 MG/250ML-% IV SOLN
INTRAVENOUS | Status: DC | PRN
  Administered 2022-01-18: 60 ug/min via INTRAVENOUS

## 2022-01-18 MED ORDER — ONDANSETRON HCL 4 MG/2ML IJ SOLN
INTRAMUSCULAR | Status: AC
  Filled 2022-01-18: qty 2

## 2022-01-18 MED ORDER — DIPHENHYDRAMINE HCL 50 MG/ML IJ SOLN
INTRAMUSCULAR | Status: DC | PRN
  Administered 2022-01-18: 6.25 mg via INTRAVENOUS

## 2022-01-18 MED ORDER — SIMETHICONE 80 MG PO CHEW
80.0000 mg | CHEWABLE_TABLET | Freq: Three times a day (TID) | ORAL | Status: DC
  Administered 2022-01-19 – 2022-01-20 (×2): 80 mg via ORAL
  Filled 2022-01-18 (×4): qty 1

## 2022-01-18 MED ORDER — ENOXAPARIN SODIUM 80 MG/0.8ML IJ SOSY
0.5000 mg/kg | PREFILLED_SYRINGE | INTRAMUSCULAR | Status: DC
  Administered 2022-01-19 – 2022-01-20 (×2): 70 mg via SUBCUTANEOUS
  Filled 2022-01-18 (×2): qty 0.8

## 2022-01-18 MED ORDER — IBUPROFEN 600 MG PO TABS
600.0000 mg | ORAL_TABLET | Freq: Four times a day (QID) | ORAL | Status: DC
  Administered 2022-01-18 – 2022-01-20 (×8): 600 mg via ORAL
  Filled 2022-01-18 (×8): qty 1

## 2022-01-18 MED ORDER — DIPHENHYDRAMINE HCL 25 MG PO CAPS: 25.0000 mg | ORAL_CAPSULE | Freq: Four times a day (QID) | ORAL | Status: DC | PRN

## 2022-01-18 MED ORDER — SCOPOLAMINE 1 MG/3DAYS TD PT72
MEDICATED_PATCH | TRANSDERMAL | Status: AC
  Filled 2022-01-18: qty 1

## 2022-01-18 MED ORDER — LACTATED RINGERS IV SOLN: INTRAVENOUS | Status: DC

## 2022-01-18 MED ORDER — TRANEXAMIC ACID-NACL 1000-0.7 MG/100ML-% IV SOLN
INTRAVENOUS | Status: AC
  Filled 2022-01-18: qty 100

## 2022-01-18 MED ORDER — DIPHENHYDRAMINE HCL 25 MG PO CAPS: 25.0000 mg | ORAL_CAPSULE | ORAL | Status: DC | PRN

## 2022-01-18 MED ORDER — TRANEXAMIC ACID-NACL 1000-0.7 MG/100ML-% IV SOLN
1000.0000 mg | INTRAVENOUS | Status: AC
  Administered 2022-01-18: 1000 mg via INTRAVENOUS

## 2022-01-18 MED ORDER — DIBUCAINE (PERIANAL) 1 % EX OINT: 1.0000 "application " | TOPICAL_OINTMENT | CUTANEOUS | Status: DC | PRN

## 2022-01-18 MED ORDER — ACETAMINOPHEN 500 MG PO TABS
1000.0000 mg | ORAL_TABLET | Freq: Four times a day (QID) | ORAL | Status: DC
  Administered 2022-01-18 – 2022-01-20 (×8): 1000 mg via ORAL
  Filled 2022-01-18 (×8): qty 2

## 2022-01-18 MED ORDER — ACETAMINOPHEN 10 MG/ML IV SOLN
INTRAVENOUS | Status: AC
  Filled 2022-01-18: qty 100

## 2022-01-18 MED ORDER — SODIUM CHLORIDE 0.9% FLUSH: 3.0000 mL | INTRAVENOUS | Status: DC | PRN

## 2022-01-18 MED ORDER — ZOLPIDEM TARTRATE 5 MG PO TABS: 5.0000 mg | ORAL_TABLET | Freq: Every evening | ORAL | Status: DC | PRN

## 2022-01-18 MED ORDER — SIMETHICONE 80 MG PO CHEW
80.0000 mg | CHEWABLE_TABLET | ORAL | Status: DC | PRN
  Administered 2022-01-18 – 2022-01-19 (×2): 80 mg via ORAL

## 2022-01-18 MED ORDER — SENNOSIDES-DOCUSATE SODIUM 8.6-50 MG PO TABS
2.0000 | ORAL_TABLET | ORAL | Status: DC
  Administered 2022-01-19 – 2022-01-20 (×2): 2 via ORAL
  Filled 2022-01-18 (×2): qty 2

## 2022-01-18 MED ORDER — NALOXONE HCL 4 MG/10ML IJ SOLN
1.0000 ug/kg/h | INTRAVENOUS | Status: DC | PRN
  Filled 2022-01-18: qty 5

## 2022-01-18 MED ORDER — GABAPENTIN 300 MG PO CAPS
300.0000 mg | ORAL_CAPSULE | ORAL | Status: AC
  Administered 2022-01-18: 300 mg via ORAL

## 2022-01-18 MED ORDER — GABAPENTIN 300 MG PO CAPS
ORAL_CAPSULE | ORAL | Status: AC
  Filled 2022-01-18: qty 1

## 2022-01-18 MED ORDER — SOD CITRATE-CITRIC ACID 500-334 MG/5ML PO SOLN
30.0000 mL | ORAL | Status: AC
  Administered 2022-01-18: 30 mL via ORAL

## 2022-01-18 MED ORDER — CEFAZOLIN IN SODIUM CHLORIDE 3-0.9 GM/100ML-% IV SOLN
3.0000 g | Freq: Once | INTRAVENOUS | Status: AC
  Administered 2022-01-18: 3 g via INTRAVENOUS

## 2022-01-18 MED ORDER — ACETAMINOPHEN 500 MG PO TABS
1000.0000 mg | ORAL_TABLET | ORAL | Status: AC
  Administered 2022-01-18: 1000 mg via ORAL

## 2022-01-18 MED ORDER — OXYCODONE HCL 5 MG PO TABS: 5.0000 mg | ORAL_TABLET | ORAL | Status: DC | PRN

## 2022-01-18 MED ORDER — SCOPOLAMINE 1 MG/3DAYS TD PT72
1.0000 | MEDICATED_PATCH | TRANSDERMAL | Status: DC
  Administered 2022-01-18: 1.5 mg via TRANSDERMAL

## 2022-01-18 MED ORDER — PHENYLEPHRINE HCL-NACL 20-0.9 MG/250ML-% IV SOLN
INTRAVENOUS | Status: AC
  Filled 2022-01-18: qty 250

## 2022-01-18 MED ORDER — BUPIVACAINE IN DEXTROSE 0.75-8.25 % IT SOLN
INTRATHECAL | Status: DC | PRN
Start: 2022-01-18 — End: 2022-01-18
  Administered 2022-01-18: 1.6 mL via INTRATHECAL

## 2022-01-18 MED ORDER — WITCH HAZEL-GLYCERIN EX PADS: 1.0000 "application " | MEDICATED_PAD | CUTANEOUS | Status: DC | PRN

## 2022-01-18 MED ORDER — SODIUM CHLORIDE 0.9 % IV SOLN
INTRAVENOUS | Status: AC
  Filled 2022-01-18: qty 2

## 2022-01-18 MED ORDER — OXYTOCIN-SODIUM CHLORIDE 30-0.9 UT/500ML-% IV SOLN
2.5000 [IU]/h | INTRAVENOUS | Status: AC
  Administered 2022-01-18: 2.5 [IU]/h via INTRAVENOUS
  Filled 2022-01-18: qty 500

## 2022-01-18 MED ORDER — CEFAZOLIN IN SODIUM CHLORIDE 3-0.9 GM/100ML-% IV SOLN
INTRAVENOUS | Status: AC
  Filled 2022-01-18: qty 100

## 2022-01-18 MED ORDER — METOCLOPRAMIDE HCL 5 MG/ML IJ SOLN
INTRAMUSCULAR | Status: AC
Start: 2022-01-18 — End: ?
  Filled 2022-01-18: qty 2

## 2022-01-18 MED ORDER — TRANEXAMIC ACID 1000 MG/10ML IV SOLN: INTRAVENOUS | Status: DC | PRN

## 2022-01-18 SURGICAL SUPPLY — 36 items
BENZOIN TINCTURE PRP APPL 2/3 (GAUZE/BANDAGES/DRESSINGS) ×2 IMPLANT
CHLORAPREP W/TINT 26ML (MISCELLANEOUS) ×3 IMPLANT
CLAMP CORD UMBIL (MISCELLANEOUS) IMPLANT
CLAMP UMBILICAL CORD (MISCELLANEOUS) ×1 IMPLANT
CLOTH BEACON ORANGE TIMEOUT ST (SAFETY) ×2 IMPLANT
DRESSING PREVENA PLUS CUSTOM (GAUZE/BANDAGES/DRESSINGS) IMPLANT
DRSG OPSITE POSTOP 4X10 (GAUZE/BANDAGES/DRESSINGS) ×2 IMPLANT
DRSG PREVENA PLUS CUSTOM (GAUZE/BANDAGES/DRESSINGS) ×2
ELECT REM PT RETURN 9FT ADLT (ELECTROSURGICAL) ×2
ELECTRODE REM PT RTRN 9FT ADLT (ELECTROSURGICAL) ×1 IMPLANT
EXTRACTOR VACUUM M CUP 4 TUBE (SUCTIONS) IMPLANT
GLOVE BIOGEL PI IND STRL 7.0 (GLOVE) ×2 IMPLANT
GLOVE BIOGEL PI IND STRL 7.5 (GLOVE) ×2 IMPLANT
GLOVE BIOGEL PI INDICATOR 7.0 (GLOVE) ×2
GLOVE BIOGEL PI INDICATOR 7.5 (GLOVE) ×2
GLOVE ECLIPSE 7.5 STRL STRAW (GLOVE) ×2 IMPLANT
GOWN STRL REUS W/TWL LRG LVL3 (GOWN DISPOSABLE) ×6 IMPLANT
HEMOSTAT ARISTA ABSORB 3G PWDR (HEMOSTASIS) ×1 IMPLANT
KIT ABG SYR 3ML LUER SLIP (SYRINGE) IMPLANT
NDL HYPO 25X5/8 SAFETYGLIDE (NEEDLE) IMPLANT
NEEDLE HYPO 25X5/8 SAFETYGLIDE (NEEDLE) IMPLANT
NS IRRIG 1000ML POUR BTL (IV SOLUTION) ×2 IMPLANT
PACK C SECTION WH (CUSTOM PROCEDURE TRAY) ×2 IMPLANT
PAD ABD DERMACEA PRESS 5X9 (GAUZE/BANDAGES/DRESSINGS) ×1 IMPLANT
PAD OB MATERNITY 4.3X12.25 (PERSONAL CARE ITEMS) ×2 IMPLANT
RTRCTR C-SECT PINK 25CM LRG (MISCELLANEOUS) ×2 IMPLANT
STRIP CLOSURE SKIN 1/2X4 (GAUZE/BANDAGES/DRESSINGS) ×2 IMPLANT
SUT PLAIN 0 NONE (SUTURE) ×2 IMPLANT
SUT VIC AB 0 CT1 36 (SUTURE) ×2 IMPLANT
SUT VIC AB 2-0 CT1 (SUTURE) ×4 IMPLANT
SUT VIC AB 2-0 CT1 27 (SUTURE) ×1
SUT VIC AB 2-0 CT1 TAPERPNT 27 (SUTURE) ×1 IMPLANT
SUT VIC AB 4-0 KS 27 (SUTURE) ×2 IMPLANT
TOWEL OR 17X24 6PK STRL BLUE (TOWEL DISPOSABLE) ×2 IMPLANT
TRAY FOLEY W/BAG SLVR 14FR LF (SET/KITS/TRAYS/PACK) ×2 IMPLANT
WATER STERILE IRR 1000ML POUR (IV SOLUTION) ×2 IMPLANT

## 2022-01-18 NOTE — Discharge Summary (Signed)
Postpartum Discharge Summary ? ? ?   ?Patient Name: Leslie Ali ?DOB: Sep 22, 1991 ?MRN: [id number] ? ?Date of admission: 01/18/2022 ?Delivery date:01/18/2022  ?Delivering provider: Woodroe Mode  ?Date of discharge: 01/20/2022 ? ?Admitting diagnosis: S/P cesarean section [Z98.891] ?Intrauterine pregnancy: [redacted]w[redacted]d    ?Secondary diagnosis:  Principal Problem: ?  S/P cesarean section ?Active Problems: ?  History of pre-eclampsia ?  Anxiety and depression ?  Chronic hypertension during pregnancy, antepartum ?  Maternal obesity affecting pregnancy, antepartum ?  Previous cesarean delivery affecting pregnancy ?  Pelvic adhesions ?  Unwanted fertility ?  LGA (large for gestational age) fetus affecting management of mother ? ?Additional problems: None  ?Discharge diagnosis: Term Pregnancy Delivered and CHTN                                              ?Post partum procedures: none ?Augmentation: N/A ?Complications: None ? ?Hospital course: Scheduled C/S   31y.o. yo [id number] admitted to the hospital 01/18/2022 for scheduled cesarean section with the following indication: repeat, CHTN .Delivery details are as follows:  ?Membrane Rupture Time/Date: 10:35 AM ,01/18/2022   ?Delivery Method:C-Section, Low Transverse  ?Details of operation can be found in separate operative note.  Patient had an uncomplicated postpartum course. BP controlled on Procardia XL 30 mg daily.  She is ambulating, tolerating a regular diet, passing flatus, and urinating well. Patient is discharged home in stable condition on  01/20/22 ?       ?Newborn Data: ?Birth date:01/18/2022  ?Birth time:10:36 AM  ?Gender:Female  ?Living status:Living  ?Apgars:9 ,9  ?Weight:3980 g    ? ?Magnesium Sulfate received: No ?BMZ received: No ?Rhophylac:No ?MMR:No ?T-DaP:Given prenatally ?Flu: No ?Transfusion:No ? ?Physical exam  ?Vitals:  ? 01/19/22 1332 01/19/22 2106 01/19/22 2200 01/20/22 0624  ?BP: (!) 133/57 (!) 148/64 132/70 133/82  ?Pulse: 70 73 76 79   ?Resp: 18   20  ?Temp: 98.9 ?F (37.2 ?C) 98.6 ?F (37 ?C)  98.4 ?F (36.9 ?C)  ?TempSrc: Oral Oral    ?SpO2: 99% 97%  98%  ?Weight:      ?Height:      ? ?General: alert, cooperative, and no distress ?Lochia: appropriate ?Uterine Fundus: firm ?Incision: Dressing is clean, dry, and intact ?DVT Evaluation: No evidence of DVT seen on physical exam. ?Negative Homan's sign. ?Labs: ?Lab Results  ?Component Value Date  ? WBC 9.7 01/16/2022  ? HGB 9.1 (L) 01/19/2022  ? HCT 35.9 (L) 01/16/2022  ? MCV 74.2 (L) 01/16/2022  ? PLT 324 01/16/2022  ? ? ?  Latest Ref Rng & Units 01/16/2022  ?  9:00 AM  ?CMP  ?Glucose 70 - 99 mg/dL 114    ?BUN 6 - 20 mg/dL 7    ?Creatinine 0.44 - 1.00 mg/dL 0.71    ?Sodium 135 - 145 mmol/L 138    ?Potassium 3.5 - 5.1 mmol/L 3.6    ?Chloride 98 - 111 mmol/L 106    ?CO2 22 - 32 mmol/L 21    ?Calcium 8.9 - 10.3 mg/dL 8.6    ?Total Protein 6.5 - 8.1 g/dL 6.1    ?Total Bilirubin 0.3 - 1.2 mg/dL 0.4    ?Alkaline Phos 38 - 126 U/L 110    ?AST 15 - 41 U/L 29    ?ALT 0 - 44 U/L 24    ? ?  Edinburgh Score: ? ?  01/19/2022  ?  8:40 AM  ?Flavia Shipper Postnatal Depression Scale Screening Tool  ?I have been able to laugh and see the funny side of things. 0  ?I have looked forward with enjoyment to things. 0  ?I have blamed myself unnecessarily when things went wrong. 0  ?I have been anxious or worried for no good reason. 1  ?I have felt scared or panicky for no good reason. 1  ?Things have been getting on top of me. 0  ?I have been so unhappy that I have had difficulty sleeping. 0  ?I have felt sad or miserable. 0  ?I have been so unhappy that I have been crying. 0  ?The thought of harming myself has occurred to me. 0  ?Edinburgh Postnatal Depression Scale Total 2  ? ? ? ?After visit meds:  ?Allergies as of 01/20/2022   ?No Known Allergies ?  ? ?  ?Medication List  ?  ? ?STOP taking these medications   ? ?cefdinir 300 MG capsule ?Commonly known as: OMNICEF ?  ?labetalol 200 MG tablet ?Commonly known as: NORMODYNE ?  ? ?   ? ?TAKE these medications   ? ?Ferrous Fumarate 324 (106 Fe) MG Tabs tablet ?Commonly known as: HEMOCYTE - 106 mg FE ?Take 1 tablet (106 mg of iron total) by mouth daily. ?  ?furosemide 20 MG tablet ?Commonly known as: Lasix ?Take 1 tablet (20 mg total) by mouth daily for 5 days. ?  ?ibuprofen 600 MG tablet ?Commonly known as: ADVIL ?Take 1 tablet (600 mg total) by mouth every 6 (six) hours as needed for mild pain, moderate pain or cramping. ?  ?NIFEdipine 30 MG 24 hr tablet ?Commonly known as: ADALAT CC ?Take 1 tablet (30 mg total) by mouth daily. ?  ?oxyCODONE 5 MG immediate release tablet ?Commonly known as: Oxy IR/ROXICODONE ?Take 1 tablet (5 mg total) by mouth every 6 (six) hours as needed for severe pain or breakthrough pain. ?  ?prenatal vitamin w/FE, FA 27-1 MG Tabs tablet ?Take 1 tablet by mouth daily at 12 noon. ?  ?senna-docusate 8.6-50 MG tablet ?Commonly known as: Senokot-S ?Take 2 tablets by mouth at bedtime as needed for mild constipation or moderate constipation. ?  ? ?  ? ?  ?  ? ? ?  ?Discharge Care Instructions  ?(From admission, onward)  ?  ? ? ?  ? ?  Start     Ordered  ? 01/20/22 0000  Discharge wound care:       ?Comments: As per discharge handout and nursing instructions  ? 01/20/22 0932  ? ?  ?  ? ?  ? ? ? ?Discharge home in stable condition ?Infant Feeding: Breast ?Infant Disposition:home with mother ?Discharge instruction: per After Visit Summary and Postpartum booklet. ?Activity: Advance as tolerated. Pelvic rest for 6 weeks.  ?Diet: routine diet ?Future Appointments:No future appointments. ?Follow up Visit: ? ?Message sent to [facility] by Dr Higinio Plan:  ?Please schedule this patient for a In person postpartum visit in 6 weeks with the following provider: Any provider. ?Additional Postpartum F/U:Incision check 1 week and BP check 1 week  ?High risk pregnancy complicated by: HTN ?Delivery mode:  C-Section, Low Transverse  ?Anticipated Birth Control:  BTL done PP ? ? ?01/20/2022 ?Verita Schneiders,  MD ? ? ? ?

## 2022-01-18 NOTE — Op Note (Signed)
Cesarean Section Operative Report ? ?Leslie Ali ? ?01/18/2022 ? ?Indications: history three prior cesarean sections, undesired fertility ? ?Pre-operative Diagnosis: repeat low transverse cesarean section, bilateral tubal ligation (modified [place]oy) ? ?Post-operative Diagnosis: Same  ? ?Surgeon: Surgeon(s) and Role: ?   * Beckey Polkowski, Ailene Rud, MD - Assisting ?   Woodroe Mode, MD - Primary ?   Patriciaann Clan, DO - Assisting  ? ?Attending Attestation: I was present and scrubbed for the entire procedure.  ? ?Anesthesia: spinal  ?  ?Estimated Blood Loss: 210 ml ? ?Total IV Fluids: 2100 ml LR ? ?Urine Output:: 400 ml clear yellow urine ? ?Specimens: none ? ?Findings: Viable female infant in cephalic presentation; Apgars pending; weight pending; arterial cord pH not obtained;  clear amniotic fluid; intact placenta with three vessel cord; normal uterus, fallopian tubes and ovaries bilaterally. Mild adhesive disease anterior to the rectus and anterior to the uterus. ? ?Baby condition / location:  Couplet care / Skin to Skin  ? ?Complications: no complications ? ?Indications: ?Leslie Ali is a 31 y.o. [phone] with an IUP [redacted]w[redacted]d presenting for scheduled repeat cesarean with bilateral tubal ligation. ? ?The risks, benefits, complications, treatment options, and exected outcomes were discussed with the patient . The patient dwith the proposed plan, giving informed consent. identified as Leslie Ali and the procedure verified as C-Section Delivery. ? ?Procedure Details:  ?The patient was taken back to the operative suite where spinal anesthesia was placed. TXA was given at time of skin incision for bleeding prophylaxis. The cell saver was also utilized for the procedure. ? ?A time out was held and the above information confirmed.  ? ?After induction of anesthesia, the patient was draped and prepped in the usual sterile manner and placed in a dorsal supine position with a leftward tilt. A Pfannenstiel  incision was made and carried down through the subcutaneous tissue to the fascia. Fascial incision was made and sharply extended transversely. The fascia was separated from the underlying rectus tissue superiorly and inferiorly. The peritoneum was identified and bluntly entered and extended longitudinally. Alexis retractor was placed. A bladder flap was not created. A low transverse uterine incision was made and extended bluntly. Delivered from cephalic presentation was a viable infant with Apgars and weight as above.  After waiting 60 seconds for delayed cord cutting, the umbilical cord was clamped and cut cord blood was obtained for evaluation. Cord ph was not sent. The placenta was removed Intact and appeared normal. The uterine outline, tubes and ovaries appeared normal. The uterine incision was closed with running locked sutures of 0-Vicryl with an imbricating layer of the same.   Hemostasis was observed. he patient's left fallopian tube was then identified, brought to the incision, and grasped with a Babcock clamp. The tube was then followed out to the fimbria. The Babcock clamp was then used to grasp the tube approximately 4 cm from the cornual region. A 3 cm segment of the tube was then ligated with two free ties of plain gut suture, transected, and excised. Hemostasis was noted and the tube was returned to the abdomen. The right fallopian tube was then identified to its fimbriated end, ligated with two free ties of plain gut suture, and a 3 cm segment excised in a similar fashion. Hemostasis was noted, and the tube returned to the abdomen. The peritoneum was closed with 2-0 Vicryl. The rectus muscles were examined and hemostasis observed. Arista was placed. The fascia was then reapproximated  with running sutures of 0-Vicryl. The subcuticular closure was performed using 2-0 plain gut. The skin was closed with 4-0 Vicryl. ? ?Instrument, sponge, and needle counts were correct prior the abdominal closure and  were correct at the conclusion of the case.  ? ? ? ?Disposition: PACU - hemodynamically stable.  ? ?Maternal Condition: stable  ? ? ? ?

## 2022-01-18 NOTE — Anesthesia Procedure Notes (Signed)
Spinal ? ?Patient location during procedure: OR ?Start time: 01/18/2022 9:54 AM ?End time: 01/18/2022 10:01 AM ?Staffing ?Performed: anesthesiologist  ?Anesthesiologist: Mal Amabile, MD ?Preanesthetic Checklist ?Completed: patient identified, IV checked, site marked, risks and benefits discussed, surgical consent, monitors and equipment checked, pre-op evaluation and timeout performed ?Spinal Block ?Patient position: sitting ?Prep: DuraPrep and site prepped and draped ?Patient monitoring: cardiac monitor, continuous pulse ox, blood pressure and heart rate ?Approach: midline ?Location: L3-4 ?Injection technique: catheter ?Needle ?Needle type: Tuohy and Pencan  ?Needle gauge: 24 G ?Needle length: 12.7 cm ?Needle insertion depth: 8 cm ?Catheter type: closed end flexible ?Catheter size: 19 g ?Catheter at skin depth: 14 cm ?Assessment ?Sensory level: T4 ?Events: CSF return ?Additional Notes ?Epidural performed using LOR with air technique. No CSF, Heme or paresthesias. SAB performed through the epidural needle using 24ga Spinocan needle. CSF clear with free flow and no paresthesias. Local anesthetic and narcotics injected through the spinal needle and withdrawn. Epidural catheter threaded 5cm into the epidural space and the epidural needle was withdrawn. A sterile dressing was applied and the patient placed supine with LUD. The patient tolerated the procedure well and adequate sensory level was obtained. ? ? ? ? ? ?

## 2022-01-18 NOTE — Lactation Note (Signed)
This note was copied from a baby's chart. ?Lactation Consultation Note ? ?Patient Name: Leslie Ali ?Today's Date: 01/18/2022 ?Reason for consult: Initial assessment;Early term 37-38.6wks ?Age:33 hours ? ?Mom has BF exp. (8 months), and pumping exp. (2 months), With NICU baby. ?Mom denies breast changes during pregnancy.   ? ?Mom states she has had more trouble nursing on her left side with previous child and this baby has latched only to the right. ? ?LC assisted with latching to the left but infant was too sleepy to begin sucking after latching. ? ?LC encouraged STS, feeding with cues, 8-12 feeds in 24 hours, and hand expression prior to latching.   ? ?Mom knows to call out for lactation support if needed. ? ? ?Maternal Data ?Has patient been taught Hand Expression?: Yes ?Does the patient have breastfeeding experience prior to this delivery?: Yes ?How long did the patient breastfeed?: pumped for 2 months in NICU with third child 13 months ago and BF second child for 8 months ? ?Feeding ?Mother's Current Feeding Choice: Breast Milk ? ?LATCH Score ?Latch: Too sleepy or reluctant, no latch achieved, no sucking elicited. ? ?Audible Swallowing: None ? ?Type of Nipple: Everted at rest and after stimulation ? ?Comfort (Breast/Nipple): Soft / non-tender ? ?Hold (Positioning): Assistance needed to correctly position infant at breast and maintain latch. ? ?LATCH Score: 5 ? ? ?Lactation Tools Discussed/Used ?  ? ?Interventions ?Interventions: Breast feeding basics reviewed;Skin to skin;Hand express;Education ? ?Discharge ?Pump: Personal (Motif luna) ? ?Consult Status ?Consult Status: Follow-up ?Date: 01/19/22 ?Follow-up type: In-patient ? ? ? ?Maryruth Hancock Waylon Koffler ?01/18/2022, 3:08 PM ? ? ? ?

## 2022-01-18 NOTE — Transfer of Care (Signed)
Immediate Anesthesia Transfer of Care Note ? ?Patient: Leslie Ali ? ?Procedure(s) Performed: CESAREAN SECTION WITH BILATERAL TUBAL LIGATION ?APPLICATION OF CELL SAVER ? ?Patient Location: PACU ? ?Anesthesia Type:Spinal and Epidural ? ?Level of Consciousness: awake, alert  and oriented ? ?Airway & Oxygen Therapy: Patient Spontanous Breathing ? ?Post-op Assessment: Report given to RN and Post -op Vital signs reviewed and stable ? ?Post vital signs: Reviewed and stable ? ?Last Vitals:  ?Vitals Value Taken Time  ?BP    ?Temp    ?Pulse 67 01/18/22 1201  ?Resp 15 01/18/22 1201  ?SpO2 100 % 01/18/22 1201  ?Vitals shown include unvalidated device data. ? ?Last Pain:  ?Vitals:  ? 01/18/22 0801  ?TempSrc: Oral  ?   ? ?  ? ?Complications: No notable events documented. ?

## 2022-01-18 NOTE — Anesthesia Postprocedure Evaluation (Signed)
Anesthesia Post Note ? ?Patient: Leslie Ali ? ?Procedure(s) Performed: CESAREAN SECTION WITH BILATERAL TUBAL LIGATION ?APPLICATION OF CELL SAVER ? ?  ? ?Patient location during evaluation: PACU ?Anesthesia Type: Spinal ?Level of consciousness: oriented and awake and alert ?Pain management: pain level controlled ?Vital Signs Assessment: post-procedure vital signs reviewed and stable ?Respiratory status: spontaneous breathing, respiratory function stable and nonlabored ventilation ?Cardiovascular status: blood pressure returned to baseline and stable ?Postop Assessment: no headache, no backache, no apparent nausea or vomiting, spinal receding and patient able to bend at knees ?Anesthetic complications: no ? ? ?No notable events documented. ? ?Last Vitals:  ?Vitals:  ? 01/18/22 1230 01/18/22 1245  ?BP: (!) 142/74 (!) 120/100  ?Pulse: (!) 58 60  ?Resp: 18 19  ?Temp:    ?SpO2: 96% 98%  ?  ?Last Pain:  ?Vitals:  ? 01/18/22 1245  ?TempSrc:   ?PainSc: 0-No pain  ? ?Pain Goal:   ? ?LLE Motor Response: Purposeful movement (01/18/22 1245) ?LLE Sensation: No pain, No tingling (01/18/22 1245) ?RLE Motor Response: Purposeful movement (01/18/22 1245) ?RLE Sensation: No pain, No tingling (01/18/22 1245) ?L Sensory Level: L5-Outer lower leg, top of foot, great toe (01/18/22 1245) ?R Sensory Level: L5-Outer lower leg, top of foot, great toe (01/18/22 1245) ?Epidural/Spinal Function Cutaneous sensation: Able to Wiggle Toes (01/18/22 1245), Patient able to flex knees: Yes (01/18/22 1245), Patient able to lift hips off bed: Yes (01/18/22 1245), Back pain beyond tenderness at insertion site: No (01/18/22 1245), Progressively worsening motor and/or sensory loss: No (01/18/22 1245), Bowel and/or bladder incontinence post epidural: No (01/18/22 1230) ? ?[name] A. ? ? ? ? ?

## 2022-01-18 NOTE — Social Work (Signed)
MOB was referred for history of depression/anxiety. ? ?* Referral screened out by Clinical Social Worker because none of the following criteria appear to apply: ?~ History of anxiety/depression during this pregnancy, or of post-partum depression following prior delivery. ?~ Diagnosis of anxiety and/or depression within last 3 years ?OR ?* MOB's symptoms currently being treated with medication and/or therapy. Per chart review, MOB takes Zoloft to manage anxiety and depression symptoms.  ? ?Please contact the Clinical Social Worker if needs arise, by [facility] request, or if MOB scores greater than 9/yes to question 10 on Edinburgh Postpartum Depression Screen.  ? ?Kathrin Greathouse, MSW, LCSW ?[facility] [place]  ?Clinical Social Worker  ?[phone] ?01/18/2022  3:32 PM  ?

## 2022-01-18 NOTE — H&P (Signed)
LABOR AND DELIVERY ADMISSION HISTORY AND PHYSICAL NOTE ? ?Leslie Ali is a 31 y.o. female [phone] with IUP at [redacted]w[redacted]d by L/20 presenting for scheduled repeat cesarean section.  ? ?She reports positive fetal movement. She denies leakage of fluid or vaginal bleeding. ? ?Prenatal History/Complications: ? ?Past Medical History: ?Past Medical History:  ?Diagnosis Date  ? Anemia   ? Depression   ? History of pre-eclampsia in prior pregnancy, currently pregnant   ? PONV (postoperative nausea and vomiting)   ? severe nausea and vomitting after spinals with all CS  ? Pregnancy induced hypertension   ? ? ?Past Surgical History: ?Past Surgical History:  ?Procedure Laterality Date  ? CESAREAN SECTION    ? ? ?Obstetrical History: ?OB History   ? ? Gravida  ?4  ? Para  ?3  ? Term  ?2  ? Preterm  ?1  ? AB  ?   ? Living  ?3  ?  ? ? SAB  ?   ? IAB  ?   ? Ectopic  ?   ? Multiple  ?   ? Live Births  ?3  ?   ?  ?  ? ? ?Social History: ?Social History  ? ?Socioeconomic History  ? Marital status: Single  ?  Spouse name: Not on file  ? Number of children: Not on file  ? Years of education: Not on file  ? Highest education level: Not on file  ?Occupational History  ? Not on file  ?Tobacco Use  ? Smoking status: Never  ? Smokeless tobacco: Never  ?Vaping Use  ? Vaping Use: Never used  ?Substance and Sexual Activity  ? Alcohol use: Never  ? Drug use: Never  ? Sexual activity: Yes  ?  Birth control/protection: None, Condom  ?Other Topics Concern  ? Not on file  ?Social History Narrative  ? Not on file  ? ?Social Determinants of Health  ? ?Financial Resource Strain: Not on file  ?Food Insecurity: No Food Insecurity  ? Worried About Programme researcher, broadcasting/film/video in the Last Year: Never true  ? Ran Out of Food in the Last Year: Never true  ?Transportation Needs: No Transportation Needs  ? Lack of Transportation (Medical): No  ? Lack of Transportation (Non-Medical): No  ?Physical Activity: Not on file  ?Stress: Not on file  ?Social Connections: Not  on file  ? ? ?Family History: ?Family History  ?Problem Relation Age of Onset  ? COPD Mother   ? Asthma Mother   ? Alcohol abuse Mother   ? Hypertension Father   ? Intellectual disability Sister   ? ADD / ADHD Son   ? Stroke Paternal Grandmother   ? Hypertension Paternal Grandmother   ? Heart disease Paternal Grandmother   ? ? ?Allergies: ?No Known Allergies ? ?Medications Prior to Admission  ?Medication Sig Dispense Refill Last Dose  ? labetalol (NORMODYNE) 200 MG tablet Take 1 tablet (200 mg total) by mouth 2 (two) times daily. 60 tablet 3 01/18/2022  ? prenatal vitamin w/FE, FA (PRENATAL 1 + 1) 27-1 MG TABS tablet Take 1 tablet by mouth daily at 12 noon. 30 tablet 11 01/17/2022  ? cefdinir (OMNICEF) 300 MG capsule Take 1 capsule (300 mg total) by mouth 2 (two) times daily. 14 capsule 0   ? ? ? ?Review of Systems  ? ?All systems reviewed and negative except as stated in HPI ? ?Blood pressure 138/72, pulse 66, resp. rate 18, height 5\' 3"  (1.6 m), weight )  137.5 kg, last menstrual period 04/27/2021, SpO2 99 %. ?General appearance: alert, cooperative, and appears stated age ?Lungs: clear to auscultation bilaterally ?Heart: regular rate and rhythm ?Abdomen: soft, non-tender; bowel sounds normal ?Extremities: No calf swelling or tenderness ?Presentation: cephalic ?Fetal monitoring: 130s ?Uterine activity: quiet ?  ? ? ?Prenatal labs: ?ABO, Rh: --/--/PENDING (03/24 4174) ?Antibody: NEG (03/22 0859) ?Rubella: 1.14 (09/27 1023) ?RPR: NON REACTIVE (03/22 0900)  ?HBsAg: Negative (09/27 1023)  ?HIV: Non Reactive (01/17 0847)  ?GBS:   not performed ?2 hr Glucola: wnl ?Genetic screening:  wnl (carrier and nips) ?Anatomy US: wnl ? ?Prenatal Transfer Tool  ?Maternal Diabetes: No ?Genetic Screening: Normal ?Maternal Ultrasounds/Referrals: Normal ?Fetal Ultrasounds or other Referrals:  None ?Maternal Substance Abuse:  No ?Significant Maternal Medications:  labetalol ?Significant Maternal Lab Results: None ? ?Results for orders  placed or performed during the hospital encounter of 01/18/22 (from the past 24 hour(s))  ?ABO/Rh  ? Collection Time: 01/18/22  8:05 AM  ?Result Value Ref Range  ? ABO/RH(D) PENDING   ? ? ?Patient Active Problem List  ? Diagnosis Date Noted  ? S/P cesarean section 01/18/2022  ? LGA (large for gestational age) fetus affecting management of mother 01/18/2022  ? Urinary tract infection in pregnancy in third trimester, antepartum 01/08/2022  ? Unwanted fertility 01/03/2022  ? Pelvic adhesions 11/05/2021  ? Maternal obesity affecting pregnancy, antepartum 09/18/2021  ? Previous cesarean delivery affecting pregnancy 09/18/2021  ? Chronic hypertension during pregnancy, antepartum 09/12/2021  ? BMI 50.0-59.9, adult (HCC) 07/24/2021  ? Seasonal allergies 07/24/2021  ? Anxiety and depression 07/20/2021  ? History of pre-eclampsia 07/17/2021  ? Supervision of high risk pregnancy, antepartum 07/17/2021  ? Short interval between pregnancies complicating pregnancy, antepartum 07/17/2021  ? ? ?Assessment: ?Leslie Ali is a 31 y.o. [phone] at [redacted]w[redacted]d here for scheduled repeat cesarean section and BTL. ? ?The risks of cesarean section were discussed with the patient including but were not limited to: bleeding which may require transfusion or reoperation; infection which may require antibiotics; injury to bowel, bladder, ureters or other surrounding organs; injury to the fetus; need for additional procedures including hysterectomy in the event of a life-threatening hemorrhge; placental abnormalities wth subsequent pregnancies, incisional problems, thromboembolic phenomenon and other postoperative/anesthesia complications.  Patient also desires permanent sterilization.  Other reversible forms of contraception were discussed with patient; she declines all other modalities. Risks of procedure discussed with patient including but not limited to: risk of regret, permanence of method, bleeding, infection, injury to surrounding organs  and need for additional procedures.  Failure risk of 1-2% with increased risk of ectopic gestation if pregnancy occurs was also discussed with patient.  The patient concurred with the proposed plan, giving informed written consent for the procedures.  Patient has been NPO since midnight she will remain NPO for procedure. Anesthesia and OR aware.  Preoperative prophylactic antibiotics and SCDs ordered on call to the OR.  To OR when ready.  ? ?Anticipate greater than average blood loss and increased risk of complications given this is her fourth surgery and significant adhesions encountered at her last section, and I counseled her with regard to this. She consents to blood products if needed and 2 units have been typed and crossed. Will also plan on prophylactic TXA. She is aware that adhesions could prevent [place] from safely performing BTL at the time of this surgery. LGA fetus also noted. Given her obesity we will plan on prevena wound vac. ? ?# cHTN: patient says she was  diagnosed with this after a previous pregnancy but subsequently her bp normalized off meds and thus she was not on meds prior to pregnancy. She has been started on labetalol this pregnancy. We will monitor her BPs postpartum and likely treat. ? ?# Circ: yes, in hospital ? ?# Pain: epidural ? ?# Feeding: breast ? ?Leslie Ali ?01/18/2022, 8:51 AM ? ? ? ? ?

## 2022-01-19 ENCOUNTER — Encounter (HOSPITAL_COMMUNITY): Payer: Self-pay | Admitting: Obstetrics and Gynecology

## 2022-01-19 LAB — HEMOGLOBIN: Hemoglobin: 9.1 g/dL — ABNORMAL LOW (ref 12.0–15.0)

## 2022-01-19 MED ORDER — FERROUS FUMARATE 324 (106 FE) MG PO TABS
1.0000 | ORAL_TABLET | ORAL | Status: DC
  Administered 2022-01-19: 106 mg via ORAL
  Filled 2022-01-19: qty 1

## 2022-01-19 NOTE — Progress Notes (Signed)
Subjective: ?Postpartum Day #1: Cesarean Delivery & BTL ?Patient reports tolerating PO; foley removed but she hasn't been up to void since; denies dizziness with ambulation earlier; breastfeeding going well; she desires a circumcision for her son- she was consented and a note placed on his chart ? ?Objective: ?Vital signs in last 24 hours: ?Temp:  [97.6 ?F (36.4 ?C)-98.7 ?F (37.1 ?C)] 98.3 ?F (36.8 ?C) (03/25 0545) ?Pulse Rate:  [55-67] 55 (03/25 0545) ?Resp:  [15-20] 17 (03/25 0545) ?BP: (114-155)/(58-100) 114/60 (03/25 0545) ?SpO2:  [96 %-100 %] 98 % (03/25 0545) ? ?Physical Exam:  ?General: alert, cooperative, and no distress ?Lochia: appropriate ?Uterine Fundus: firm ?Incision: Prevena intact ?DVT Evaluation: No evidence of DVT seen on physical exam. ? ?Recent Labs  ?  01/16/22 ?0900 01/19/22 ?0457  ?HGB 11.0* 9.1*  ?HCT 35.9*  --   ? ? ?Assessment/Plan: ?Status post Cesarean section. Doing well postoperatively.  ?Continue current care. Started on qod Fe. Anticipate d/c 01/20/22. ?Procardia XL 30mg  starting this morning due to some mild range elevations yesterday. ? ? CNM ?01/19/2022, 8:52 AM ? ? ?

## 2022-01-19 NOTE — Lactation Note (Signed)
This note was copied from a baby's chart. ?Lactation Consultation Note ? ?Patient Name: Leslie Ali ?Today's Date: 01/19/2022 ?Reason for consult: Follow-up assessment;Early term 37-38.6wks;Difficult latch ?Age:34 hours ? ? ?Mom states baby is getting better at sucking.  ?Baby has not fed since this morning at 630am .  V-3 S-3 6% wt. Loss.   ?BAby placed STS.  Baby would not attempt to latch.  LC noted trembling of jaws and cheeks.  Jitteriness noted in arms.  Suck assessment: no suck then infant would have a spurt of shallow sucks then stop.   ? ?LC discussed need for supplementation.  Mom prefers donor milk.  RN at bedside assisting with baby and mom. ? ?LC attempted to bottle feed with yellow nipple using paced feeding, but after a couple of sucks LC noted no swallows and inspiratory stridor heard.  Changed to white Nfant nipple and baby had the same difficulty with flow.  21ml given total.  SLP notified and Pediatric provider.  Glucose checked and SLP consult ordered.  Mom encouraged to pump to stimulate mil supply and feedback colostrum to baby.   ? ?Maternal Data ?Has patient been taught Hand Expression?: Yes ? ?Feeding ?Mother's Current Feeding Choice: Breast Milk and Donor Milk ? ?LATCH Score ?Latch: Too sleepy or reluctant, no latch achieved, no sucking elicited. ? ?Audible Swallowing: None ? ?Type of Nipple: Everted at rest and after stimulation ? ?Comfort (Breast/Nipple): Soft / non-tender ? ?Hold (Positioning): Assistance needed to correctly position infant at breast and maintain latch. ? ?LATCH Score: 5 ? ? ?Lactation Tools Discussed/Used ?Tools: Bottle ? ?Interventions ?Interventions: Hand express;Skin to skin ? ?Discharge ?Pump: DEBP (RN will set up pump later today) ? ?Consult Status ?Consult Status: Follow-up ?Date: 01/20/22 ?Follow-up type: In-patient ? ? ? ?Maryruth Hancock Henson Fraticelli ?01/19/2022, 3:39 PM ? ? ? ?

## 2022-01-20 LAB — BPAM RBC
Blood Product Expiration Date: 202304162359
Blood Product Expiration Date: 202304162359
Unit Type and Rh: 5100
Unit Type and Rh: 5100

## 2022-01-20 LAB — TYPE AND SCREEN
ABO/RH(D): O POS
Antibody Screen: NEGATIVE
Unit division: 0
Unit division: 0

## 2022-01-20 MED ORDER — FUROSEMIDE 20 MG PO TABS: 20.0000 mg | ORAL_TABLET | Freq: Every day | ORAL | 0 refills | Status: DC

## 2022-01-20 MED ORDER — OXYCODONE HCL 5 MG PO TABS: 5.0000 mg | ORAL_TABLET | Freq: Four times a day (QID) | ORAL | 0 refills | Status: DC | PRN

## 2022-01-20 MED ORDER — IBUPROFEN 600 MG PO TABS
600.0000 mg | ORAL_TABLET | Freq: Four times a day (QID) | ORAL | 2 refills | Status: DC | PRN
Start: 2022-01-20 — End: 2022-04-09

## 2022-01-20 MED ORDER — FERROUS FUMARATE 324 (106 FE) MG PO TABS: 1.0000 | ORAL_TABLET | Freq: Every day | ORAL | 2 refills | Status: DC

## 2022-01-20 MED ORDER — NIFEDIPINE ER 30 MG PO TB24: 30.0000 mg | ORAL_TABLET | Freq: Every day | ORAL | 1 refills | Status: DC

## 2022-01-20 MED ORDER — SENNOSIDES-DOCUSATE SODIUM 8.6-50 MG PO TABS: 2.0000 | ORAL_TABLET | Freq: Every evening | ORAL | 2 refills | Status: DC | PRN

## 2022-01-21 LAB — SURGICAL PATHOLOGY

## 2022-01-21 MED FILL — Sodium Chloride IV Soln 0.9%: INTRAVENOUS | Qty: 1000 | Status: AC

## 2022-01-21 MED FILL — Heparin Sodium (Porcine) Inj 1000 Unit/ML: INTRAMUSCULAR | Qty: 30 | Status: AC

## 2022-01-24 ENCOUNTER — Ambulatory Visit (INDEPENDENT_AMBULATORY_CARE_PROVIDER_SITE_OTHER): Payer: Medicaid Other | Admitting: Family Medicine

## 2022-01-24 ENCOUNTER — Encounter: Payer: Self-pay | Admitting: Family Medicine

## 2022-01-24 VITALS — BP 142/96 | HR 90 | Ht 63.0 in | Wt 280.0 lb

## 2022-01-24 DIAGNOSIS — L929 Granulomatous disorder of the skin and subcutaneous tissue, unspecified: Secondary | ICD-10-CM

## 2022-01-24 DIAGNOSIS — Z0130 Encounter for examination of blood pressure without abnormal findings: Secondary | ICD-10-CM | POA: Diagnosis not present

## 2022-01-24 DIAGNOSIS — Z9889 Other specified postprocedural states: Secondary | ICD-10-CM

## 2022-01-24 DIAGNOSIS — Z5189 Encounter for other specified aftercare: Secondary | ICD-10-CM | POA: Diagnosis not present

## 2022-01-24 NOTE — Progress Notes (Signed)
? ?  Subjective:  ? ? Patient ID: Leslie Ali is a 31 y.o. female presenting with Wound Check and Blood Pressure Check ? on 01/24/2022 ? ?HPI: ?Here today for post op check, wound check. Prevena in place and removed. Wound noted to need some treatment so changed to provider visit. ?Also with some elevated BP, on Procardia, but did not take it this am. ? ?Review of Systems  ?Constitutional:  Negative for chills and fever.  ?Respiratory:  Negative for shortness of breath.   ?Cardiovascular:  Negative for chest pain.  ?Gastrointestinal:  Negative for abdominal pain, nausea and vomiting.  ?Genitourinary:  Negative for dysuria.  ?Skin:  Negative for rash.  ?   ?Objective:  ?  ?BP (!) 142/96   Pulse 90   Ht 5\' 3"  (1.6 m)   Wt 280 lb (127 kg)   LMP 04/27/2021   BMI 49.60 kg/m?  ?Physical Exam ?Exam conducted with a chaperone present.  ?Constitutional:   ?   General: She is not in acute distress. ?   Appearance: She is well-developed.  ?HENT:  ?   Head: Normocephalic and atraumatic.  ?Eyes:  ?   General: No scleral icterus. ?Cardiovascular:  ?   Rate and Rhythm: Normal rate.  ?Pulmonary:  ?   Effort: Pulmonary effort is normal.  ?Abdominal:  ?   Palpations: Abdomen is soft.  ?Musculoskeletal:  ?   Cervical back: Neck supple.  ?Skin: ?   General: Skin is warm and dry.  ?   Comments: Incision with several areas of separation and granulation tissue, treated with AgNO3.  ?Neurological:  ?   Mental Status: She is alert and oriented to person, place, and time.  ? ? ? ?   ?Assessment & Plan:  ?Visit for wound check - some mild ecchymosis but not infection ? ?Blood pressure check - Mildly elevated today, did not take meds, continue Procardia ? ?Postoperative granulation of tissue - s/p treatment ? ? ?No follow-ups on file. ? ?06/28/2021, MD ?01/24/2022 ?12:55 PM ? ? ? ?

## 2022-01-24 NOTE — Progress Notes (Signed)
Patient presents to office today for blood pressure check & wound check following repeat c-section 3/24. She reports doing well since discharge and has been taking lasix & nifedipine as prescribed. She states she was running behind this morning and hasn't taken her nifedipine yet. She denies dizziness, blurry vision, or headaches. Encouraged patient to take Nifedipine when she got home. ?Prevena wound vac removed, incision clean & dried. Incision well approximated except for two areas < 3cm. Wound care and signs & symptoms of infection reviewed with patient. Dr Kennon Rounds came in to assess wound before patient left office. ? ?Koren Bound RN BSN ?01/24/22 ? ?

## 2022-02-18 ENCOUNTER — Ambulatory Visit: Payer: Self-pay | Admitting: Family Medicine

## 2022-02-25 ENCOUNTER — Encounter: Payer: Self-pay | Admitting: Obstetrics and Gynecology

## 2022-02-25 ENCOUNTER — Telehealth (INDEPENDENT_AMBULATORY_CARE_PROVIDER_SITE_OTHER): Payer: Medicaid Other | Admitting: Obstetrics and Gynecology

## 2022-02-25 DIAGNOSIS — O10919 Unspecified pre-existing hypertension complicating pregnancy, unspecified trimester: Secondary | ICD-10-CM

## 2022-02-25 DIAGNOSIS — F32A Depression, unspecified: Secondary | ICD-10-CM

## 2022-02-25 DIAGNOSIS — F419 Anxiety disorder, unspecified: Secondary | ICD-10-CM

## 2022-02-25 DIAGNOSIS — Z3009 Encounter for other general counseling and advice on contraception: Secondary | ICD-10-CM | POA: Diagnosis not present

## 2022-02-25 DIAGNOSIS — Z1332 Encounter for screening for maternal depression: Secondary | ICD-10-CM | POA: Diagnosis not present

## 2022-02-25 DIAGNOSIS — O1003 Pre-existing essential hypertension complicating the puerperium: Secondary | ICD-10-CM

## 2022-02-25 MED ORDER — ESCITALOPRAM OXALATE 10 MG PO TABS: 10.0000 mg | ORAL_TABLET | Freq: Every day | ORAL | 5 refills | Status: AC

## 2022-02-25 NOTE — Addendum Note (Signed)
Addended by: Hermina Staggers on: 02/25/2022 04:46 PM ? ? Modules accepted: Orders ? ?

## 2022-02-25 NOTE — Patient Instructions (Signed)

## 2022-02-25 NOTE — Progress Notes (Signed)
? ?Provider location: [facility]  ? ?Patient location: Home ? ?I connected with Corena Pilgrim on 02/25/22 at  4:15 PM EDT by Mychart Video Encounter and verified that I am speaking with the correct person using two identifiers.     ?  ?I discussed the limitations, risks, security and privacy concerns of performing an evaluation and management service virtually and the availability of in person appointments. I also discussed with the patient that there may be a patient responsible charge related to this service. The patient expressed understanding and agreed to proceed. ? ?Post Partum Visit Note ?Subjective:  ? ?Leslie Ali is a 31 y.o. [phone] female who presents for a postpartum visit. She is 5 weeks postpartum following a repeat cesarean section and tubal ligation was performed.  I have fully reviewed the prenatal and intrapartum course. The delivery was at 38 gestational weeks.  Anesthesia: spinal. Postpartum course has been unremarkable. Baby is doing well. Baby is feeding by both breast and bottle - Enfamil Neuro Pro . Bleeding no bleeding. Bowel function is normal. Bladder function is normal. Patient is not sexually active. Contraception method is tubal ligation. Postpartum depression screening: positive. ? ? ?The pregnancy intention screening data noted above was reviewed. Potential methods of contraception were discussed. The patient elected to proceed with No data recorded. ? ? ? ?The following portions of the patient's history were reviewed and updated as appropriate: allergies, current medications, past family history, past medical history, past social history, past surgical history, and problem list. ? ?Review of Systems ?Pertinent items noted in HPI and remainder of comprehensive ROS otherwise negative. ? ?Objective:  ?LMP 04/27/2021     ?General:  Alert, oriented and cooperative. Patient is in no acute distress.  ?Respiratory: Normal respiratory  effort, no problems with respiration noted  ?Mental Status: Normal mood and affect. Normal behavior. Normal judgment and thought content.  ?Rest of physical exam deferred due to type of encounter   ?Assessment:  ? ? Nl postpartum exam. ? CHTN ? Anxiety/Depression ? ?Plan:  ?Essential components of care per ACOG recommendations: ? ?1.  Mood and well being: Patient with positive depression screening today. Reviewed local resources for support.  ?- Patient does not use tobacco. ?- hx of drug use? No   ? ?2. Infant care and feeding:  ?-Patient currently breastmilk feeding? Yes  If breastmilk feeding discussed return to work and pumping. If needed, patient was provided letter for work to allow for every 2-3 hr pumping breaks, and to be granted a private location to express breastmilk and refrigerated area to store breastmilk. Reviewed importance of draining breast regularly to support lactation. ?-Social determinants of health (SDOH) reviewed in EPIC. No concerns ? ?3. Sexuality, contraception and birth spacing ?- Patient does not want a pregnancy in the next year.  Desired family size is completed  ?- S/P BTL ?4. Sleep and fatigue ?-Encouraged family/partner/community support of 4 hrs of uninterrupted sleep to help with mood and fatigue ? ?5. Physical Recovery  ?- Discussed patients delivery and complications ?- Patient had a c section - Patient has urinary incontinence?  ?- Patient is safe to resume physical and sexual activity ? ?6.  Health Maintenance ?- Last pap smear done 8/20 and was normal with negative HPV. ? ? ?7. Chronic Disease ?Will start Lexapro 10 mg qd. Has tried Zoloft in the past and did not help ?Will completed 30 more days of Procardia and then discontinue ?Nurse visit in  6 weeks for BP check ?Pt advised to find PCP ? ?I provided 10 minutes of face-to-face time during this encounter. ? ? ? ?No follow-ups on file. ? ?Future Appointments  ?Date Time Provider Department Center  ?05/22/2022  9:00 AM  Georganna Skeans, MD [facility] None  ? ? ?Marylynn Pearson, RN ?[facility], [facility]  ?

## 2022-02-26 NOTE — BH Specialist Note (Signed)
Integrated Behavioral Health via Telemedicine Visit ? ?03/07/2022 ?Leslie Ali ?[id number] ? ?Number of Integrated Behavioral Health Clinician visits: 1- Initial Visit ? ?Session Start time: 1455 ?  ?Session End time: 1518 ? ?Total time in minutes: 23 ? ? ?Referring Provider: Nettie Elm, MD ?Patient/Family location: Home ?[facility] Provider location: [facility] [employer] [facility] ? ?All persons participating in visit: Patient Leslie Ali and [name]  ? ?Types of Service: Individual psychotherapy and Video visit ? ?I connected with Leslie Ali and/or Leslie Ali's  n/a  via  Telephone or Video Enabled Telemedicine Application  (Video is Caregility application) and verified that I am speaking with the correct person using two identifiers. Discussed confidentiality: Yes  ? ?I discussed the limitations of telemedicine and the availability of in person appointments.  Discussed there is a possibility of technology failure and discussed alternative modes of communication if that failure occurs. ? ?I discussed that engaging in this telemedicine visit, they consent to the provision of behavioral healthcare and the services will be billed under their insurance. ? ?Patient and/or legal guardian expressed understanding and consented to Telemedicine visit: Yes  ? ?Presenting Concerns: ?Patient and/or family reports the following symptoms/concerns: Started taking Lexapro over one week ago, noticed "a little difference" in reduced depression; has good support at home. Pt feels best when she's able to get out of the house; looking forward to summer trips and events.  ?Duration of problem: Increase perinatal; Severity of problem: moderate ? ?Patient and/or Family's Strengths/Protective Factors: ?Social connections, Concrete supports in place (healthy food, safe environments, etc.), and Sense of purpose ? ?Goals Addressed: ?Patient will: ? Reduce symptoms of:  anxiety and depression   ? Demonstrate ability to: Increase healthy adjustment to current life circumstances ? ?Progress towards Goals: ?Ongoing ? ?Interventions: ?Interventions utilized:  Medication Monitoring and Link to [employer] ?Standardized Assessments completed: GAD-7 and PHQ 9 ? ?Patient and/or Family Response: Patient agrees with treatment plan. ? ? ?Assessment: ?Patient currently experiencing Adjustment disorder with mixed anxious and depressed mood.  ? ?Patient may benefit from psychoeducation and brief therapeutic interventions regarding coping with symptoms of depression, anxiety ?. ? ?Plan: ?Follow up with behavioral health clinician on : Call [name] at [phone], as needed. ?Behavioral recommendations:  ?-Continue taking Lexapro as prescribed ?-Continue plan to attend initial PCP appointment; discuss with PCP about taking over medication management of Lexapro ?-Consider registering for and attending new mom support group of choice at [email or web address]  ?-Continue plans for summer trips and events ?-Consider at least 10 minutes outdoor time daily on good-weather days ?Referral(s): Integrated Art gallery manager (In Clinic) and [employer]:  new mom support ? ?I discussed the assessment and treatment plan with the patient and/or parent/guardian. They were provided an opportunity to ask questions and all were answered. They agreed with the plan and demonstrated an understanding of the instructions. ?  ?They were advised to call back or seek an in-person evaluation if the symptoms worsen or if the condition fails to improve as anticipated. ? ?Rae Lips, LCSW ? ? ?  03/07/2022  ?  2:58 PM 01/10/2022  ?  9:10 AM 12/18/2021  ? 10:37 AM 11/29/2021  ?  3:03 PM 08/22/2021  ? 11:07 AM  ?Depression screen PHQ 2/9  ?Decreased Interest 0 0 0 0 0  ?Down, Depressed, Hopeless 1 0 0 0 0  ?PHQ - 2 Score 1 0 0 0 0  ?Altered sleeping 0 0 0  0 0  ?Tired, decreased energy 1 0 0 0 0  ?Change in  appetite 0 0 0 0 0  ?Feeling bad or failure about yourself  0 0 0 0 0  ?Trouble concentrating 0 0 0 0 0  ?Moving slowly or fidgety/restless 0 0 0 0 0  ?Suicidal thoughts 0 0 0 0 0  ?PHQ-9 Score 2 0 0 0 0  ?Difficult doing work/chores   Not difficult at all    ? ? ?  03/07/2022  ?  3:01 PM 01/10/2022  ?  9:10 AM 12/18/2021  ? 10:38 AM 11/29/2021  ?  3:04 PM  ?GAD 7 : Generalized Anxiety Score  ?Nervous, Anxious, on Edge 1 0 0 0  ?Control/stop worrying 0 0 0 0  ?Worry too much - different things 0 0 0 0  ?Trouble relaxing 0 0 0 0  ?Restless 0 0 0 0  ?Easily annoyed or irritable 0 0 0 0  ?Afraid - awful might happen 0 0 0 0  ?Total GAD 7 Score 1 0 0 0  ?Anxiety Difficulty   Not difficult at all   ? ? ? ? ?

## 2022-03-07 ENCOUNTER — Ambulatory Visit (INDEPENDENT_AMBULATORY_CARE_PROVIDER_SITE_OTHER): Payer: Medicaid Other | Admitting: Clinical

## 2022-03-07 DIAGNOSIS — F4323 Adjustment disorder with mixed anxiety and depressed mood: Secondary | ICD-10-CM | POA: Diagnosis not present

## 2022-03-07 NOTE — Patient Instructions (Signed)
[facility] [address] [place], [place] [address] [phone] (main office) [phone] (Shawnette Augello's office)  New Parent Support Groups [email or web address]   

## 2022-04-01 ENCOUNTER — Encounter: Payer: Self-pay | Admitting: *Deleted

## 2022-04-01 ENCOUNTER — Ambulatory Visit: Payer: Medicaid Other

## 2022-04-09 ENCOUNTER — Ambulatory Visit (INDEPENDENT_AMBULATORY_CARE_PROVIDER_SITE_OTHER): Payer: Medicaid Other | Admitting: General Practice

## 2022-04-09 VITALS — BP 130/72 | HR 61 | Ht 63.0 in | Wt 271.0 lb

## 2022-04-09 DIAGNOSIS — Z0130 Encounter for examination of blood pressure without abnormal findings: Secondary | ICD-10-CM

## 2022-04-09 NOTE — Progress Notes (Signed)
Patient presents to office today for BP check following up from virtual postpartum visit on 5/1. Her history is significant for [facility]. Nifedipine Rx was discontinued at postpartum visit. She denies headaches, dizziness or blurry vision since then. BP 130/72. Discussed need to establish care with PCP and provider list given as well as recommendations for scheduling. Patient verbalized understanding.  Chase Caller RN BSN 04/09/22

## 2022-04-09 NOTE — Patient Instructions (Addendum)
[facility] * [facility] * Primary Care at [facility] Medicine    AREA FAMILY PRACTICE PHYSICIANS  [place] ([id number]) [facility] [address] [place], [place] [address] [phone] Mon-Fri 8:30-12:30, 1:30-5:00 Accepting [facility] Family Medicine at [facility] [address], [place], [place] [address] [phone] Mon-Fri 8:00-5:30 [facility] [address] [place], [place] [address] [phone] Farris Has, Thur, Fri 8:30-5:00, Wed 10:00-7:00 (closed 1-2pm) Accepting [facility] [address], [place], [place]  [address] Phone - [phone]   Fax - [phone]  [place] [phone]) [facility] [address], [place], [place] [address] [phone] Mon-Fri 8:00-5:00 [facility] - Pediatrics at [facility])  [address] Sherian Maroon [place], [place] [address] [phone] Mon-Fri 8:30-5:30, Sat (Oct.-Mar.) 9:00-1:00 Accepting Medicaid  [place] [phone]) [facility] at [facility] [address], [place], [place] [address] [phone] Mon-Fri 8:00-5:00  [place] [phone]) [facility] Medicine at [facility] [address], [place], [place] [address] [phone] Mon-Fri 8:00-5:00 [facility] [address] [place], [place], [place] [address] [phone] Mon-Fri 8:00-5:00 [facility] [address] [place], [place] [address] [phone] Mon-Fri 8:00-5:00 [facility] [address] [place] [address] [phone] Mon-Fri 7:30-5:30  [place] [phone] & [phone]) [facility] [address], [place], [place] [address] [phone] Mon-Thur 8:00-6:00 Accepting [facility]  [facility] [address] [place], [place] [address] [phone] Mon-Thur 7:30-7:30, Fri 7:30-4:30 Accepting [facility] Medicine at [facility] [address], [place], [place]  [address] [phone]   Fax - [phone]  [place] [phone] & [phone]) Adult nurse HealthCare at [facility] [address] [place], [place] [address] [phone] Mon-Fri 7:00-5:00 [facility] [address], [place], [place] [address] [phone] Mon-Fri 8:00-5:00 Accepting Medicaid [facility] - [facility] [address] [place], [place], [place] [address] [phone] Mon-Fri 8:00-5:00 Accepting Medicaid  [place] [phone]) [facility] Care at [facility] [address] [place], [place] [address] [phone] Mon-Fri 8:00-5:00 [facility] - [facility] at [facility]) [address], [place], [place] [address] [phone] Mon-Fri 8:00-5:00 Accepting Medicaid [facility] - [facility] ([facility] [employer]) [address], [place], [place] [address] [phone] Mon-Fri 8:00-5:30, Sat&Sun by appointment (phones open at 8:30) Accepting [facility] [phone] & [phone]) [facility] Medicine [address], [place], [place] [address] [phone] Mon-Thur 8:00-7:00, Fri 8:00-5:00, Sat 8:00-12:00, Sun 9:00-12:00 Accepting Medicaid [facility] - Family Medicine at [facility] [address] [place], [place] [address] [phone] Mon-Thur 8:00-5:00 Accepting Medicaid [facility] - Family Medicine at [place] [address] Sherian Maroon [place], [place] [address] [phone] Mon-Fri 8:00-5:30, Sat (Oct.-Mar.) 9:00-1:00 Accepting [facility]  [place] [phone]) [facility] Medicine [address] [place], [place] [address] [phone] Mon-Fri 8:00-5:00 Accepting [facility]   [place] [phone]) [facility] at [facility] [address], [place], [place] [address] [phone] Mon-Fri 8:00-5:00 [facility] [address] Clint Lipps [place], [place] [address] [phone] Mon-Fri 8:00-5:00 [facility] Pediatrics - [place] [address], [place], [place] [address] [phone] Mon-Fri 8:00-5:00 After hours  clinic [facility] [place], [place] [address]) [phone] Mon-Fri 5:00-8:00, Sat 12:00-6:00, Sun 10:00-4:00 Accepting Medicaid [facility] at [facility]. [address] [place], [place]  [address] [phone]   Fax - [phone]  [place] [phone]) Adult nurse HealthCare at [facility] [address] [place], [place], [place] [address] [phone] Mon-Fri 8:00-5:00 [facility] - [place] [facility]) [address] [place] [address] [place], [place] [address] [phone] Mon-Thur 8:00-7:00, Fri 8:00-5:00, Sat 8:00-12:00

## 2022-05-22 ENCOUNTER — Ambulatory Visit: Payer: Medicaid Other | Admitting: Family Medicine

## 2022-05-24 ENCOUNTER — Ambulatory Visit (HOSPITAL_COMMUNITY): Payer: Self-pay

## 2023-03-19 IMAGING — US US MFM FETAL BPP W/O NON-STRESS
1 series · 12 of 13 positions shown · non-contrast
Comparison: none

[Series 1: us mfm fetal bpp w/o non-stress · 13 acquisitions, 12 frames shown]
[im 1/13]
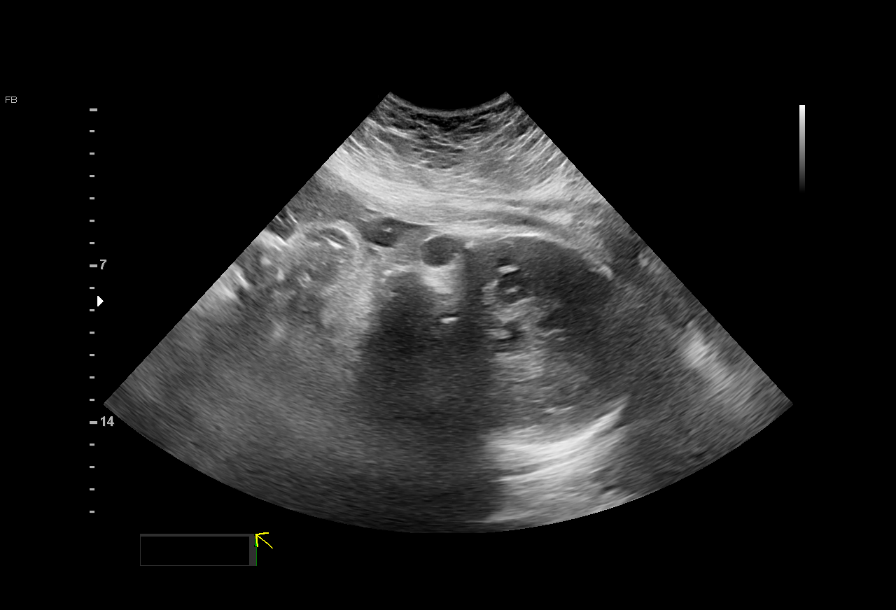
[im 2/13]
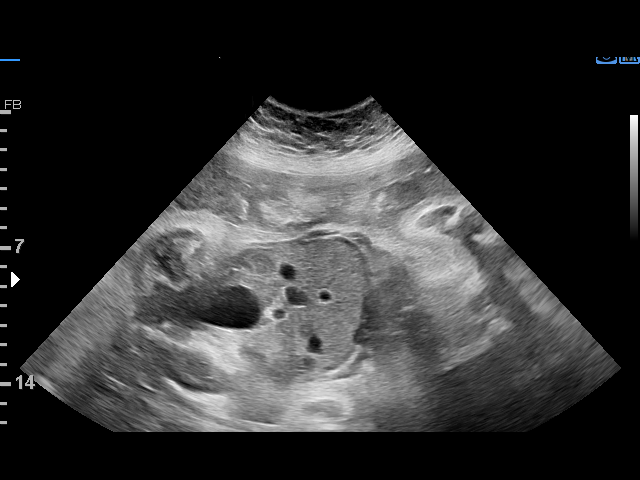
[im 3/13]
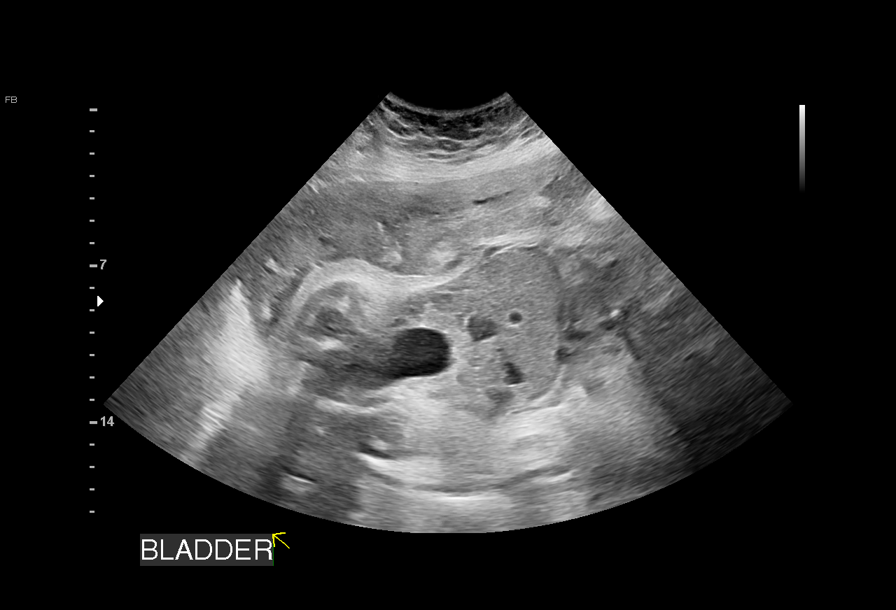
[im 4/13]
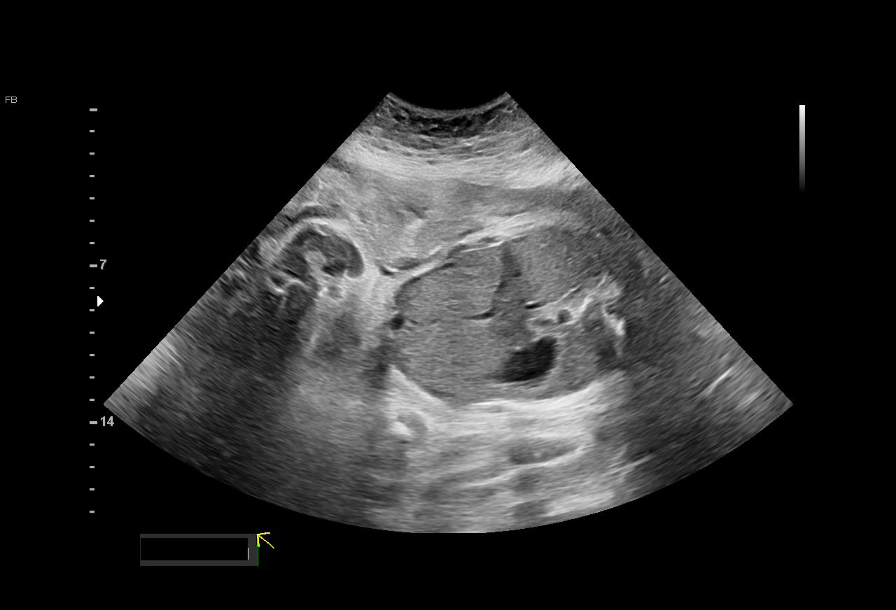
[im 5/13]
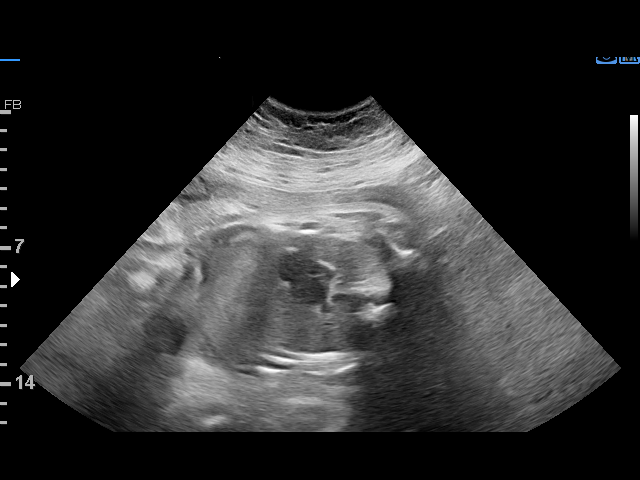
[im 6/13]
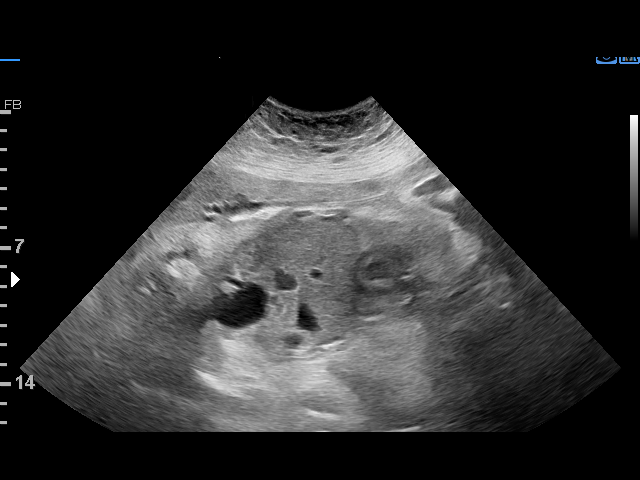
[im 8/13]
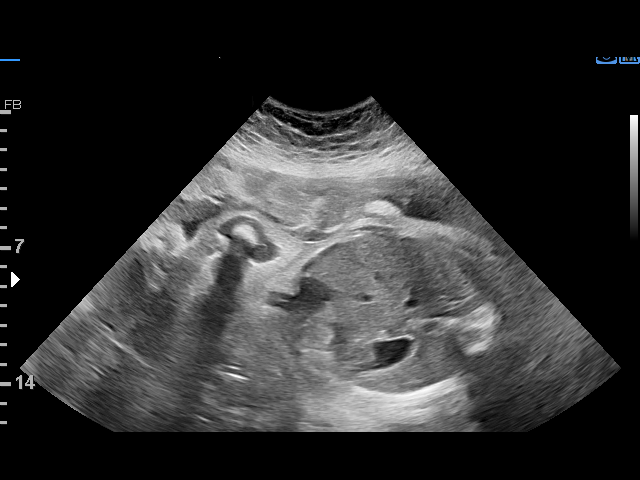
[im 9/13]
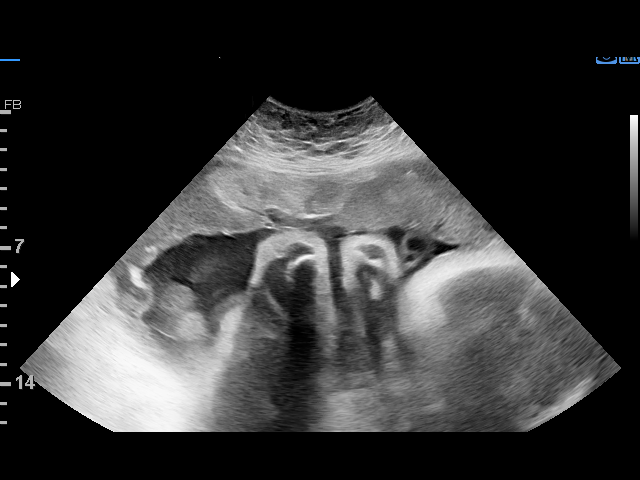
[im 10/13]
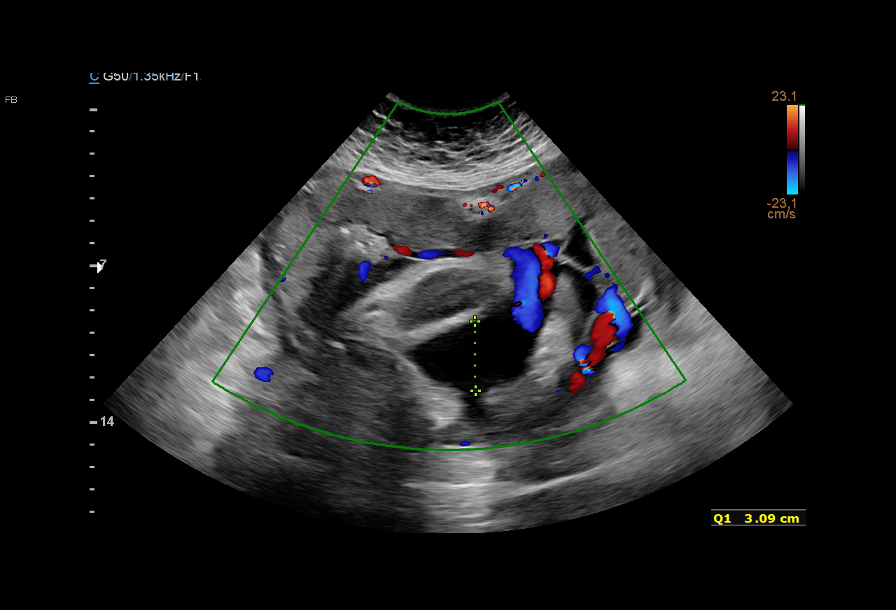
[im 11/13]
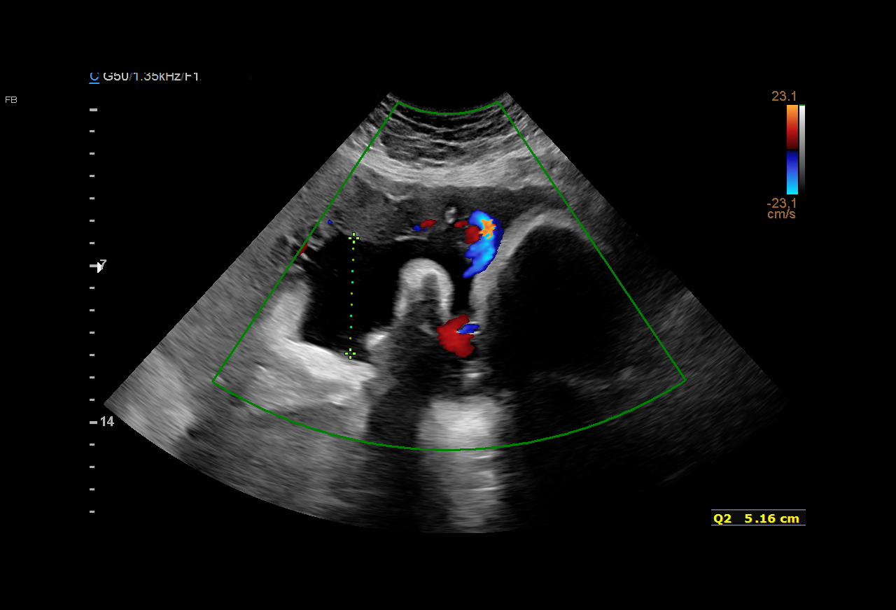
[im 12/13]
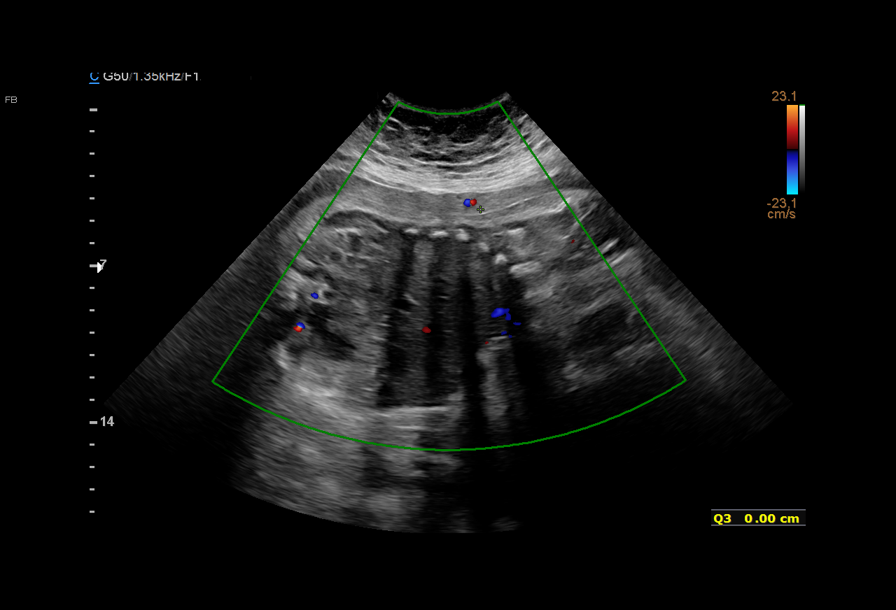
[im 13/13]
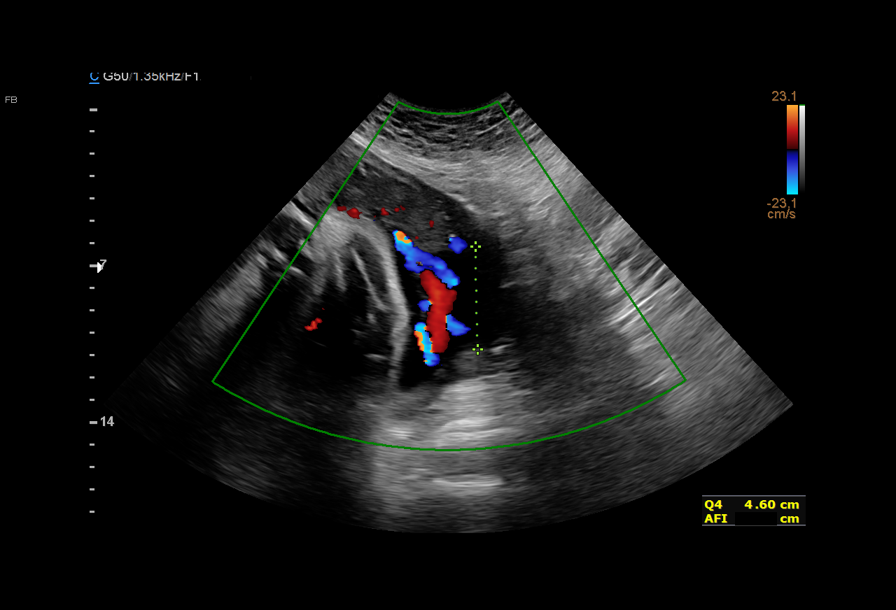

[12 of 13 positions shown; findings below may reference images not displayed]

GLASER NP                              [HOSPITAL] at
 Ref. Address:     Faculty Practice

Indications

 34 weeks gestation of pregnancy
 Obesity complicating pregnancy, third
 trimester (BMI 47.8)
 Short interval between pregancies, 3rd
 trimester
 History of cesarean delivery, currently
 pregnant x 3
 Poor obstetric history: Previous
 preeclampsia / eclampsia/gestational HTN
 Low Risk NIPS(Negative Horizon); neg AFP
 Hypertension - Chronic/Pre-existing
 (labetalol)
Fetal Evaluation

 Num Of Fetuses:         1
 Fetal Heart Rate(bpm):  144
 Cardiac Activity:       Observed
 Presentation:           Cephalic
 Placenta:               Anterior
 P. Cord Insertion:      Previously Visualized

 Amniotic Fluid
 AFI FV:      Within normal limits

 AFI Sum(cm)     %Tile       Largest Pocket(cm)
 12.9            42
 RUQ(cm)       RLQ(cm)       LUQ(cm)        LLQ(cm)
 3.1           4.6           5.2            0
Biophysical Evaluation

 Amniotic F.V:   Pocket => 2 cm             F. Tone:        Observed
 F. Movement:    Observed                   Score:          [DATE]
 F. Breathing:   Observed
OB History

 Gravidity:    4         Term:   2        Prem:   1        SAB:   0
 TOP:          0       Ectopic:  0        Living: 3
Gestational Age

 LMP:           34w 6d        Date:  04/27/21                 EDD:   02/01/22
 Best:          34w 6d     Det. By:  LMP  (04/27/21)          EDD:   02/01/22
Impression

 Antenatal testing performed given elevated BMI 40
 The biophysical profile was [DATE] with good fetal movement and
 amniotic fluid volume.
Recommendations

 Continue weekly testing
 Follow up growth in 1 week as previously scheduled.

## 2023-03-26 IMAGING — US US MFM FETAL BPP W/O NON-STRESS
1 series · 13 of 28 positions shown · non-contrast
Comparison: none

[Series 1: us mfm fetal bpp w/o non-stress · 47 acquisitions, 13 frames shown]
[im 2/47]
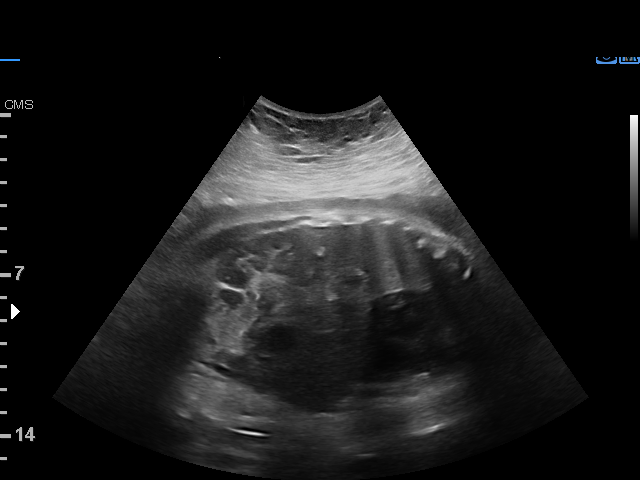
[im 6/47]
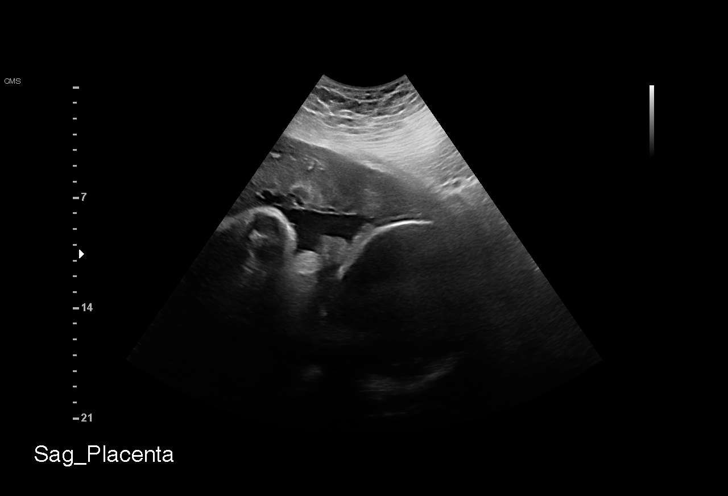
[im 9/47]
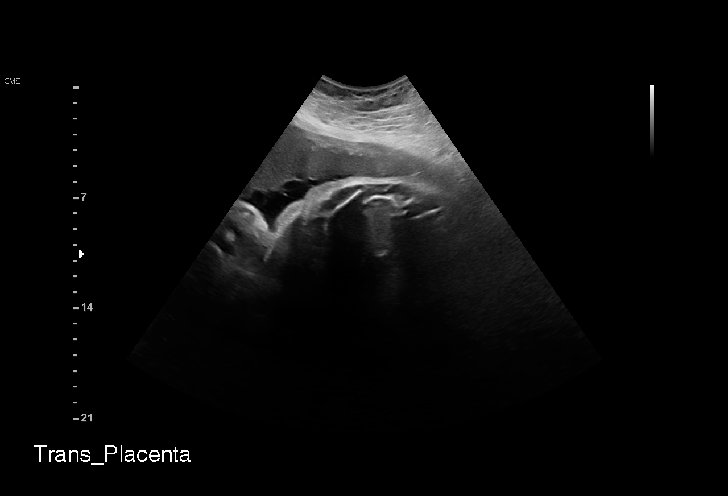
[im 12/47]
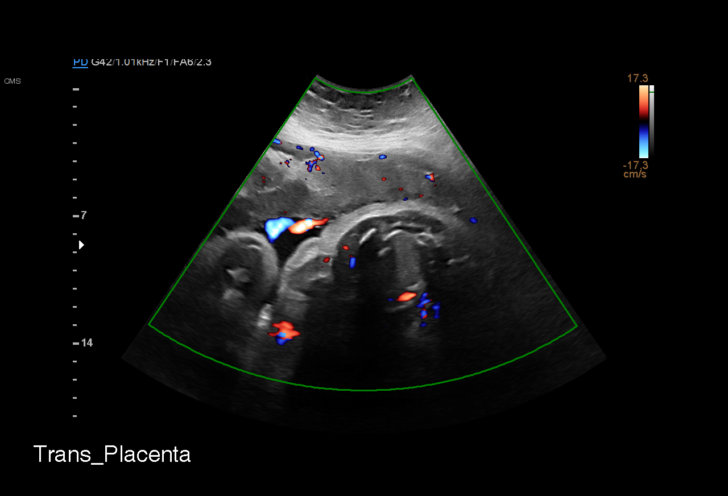
[im 16/47]
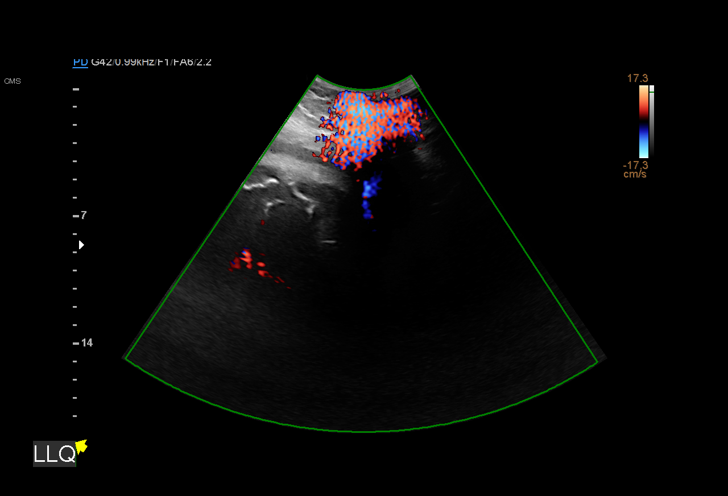
[im 19/47]
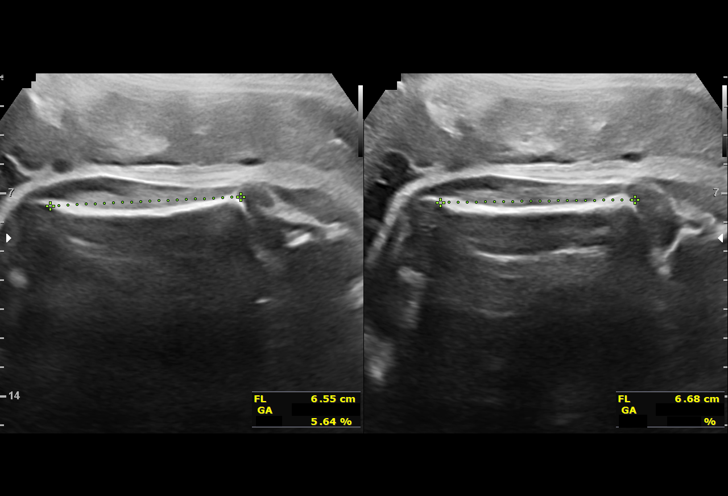
[im 24/47]
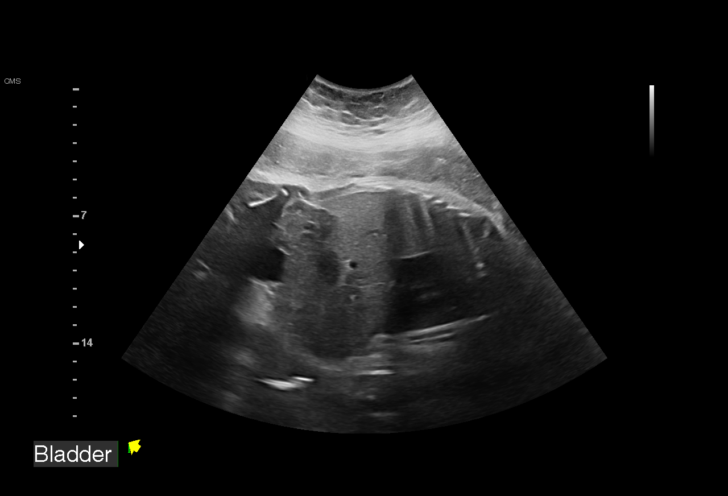
[im 28/47]
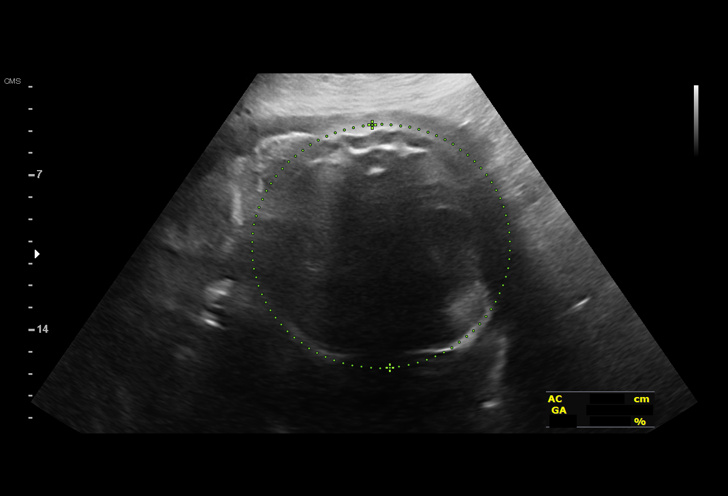
[im 31/47]
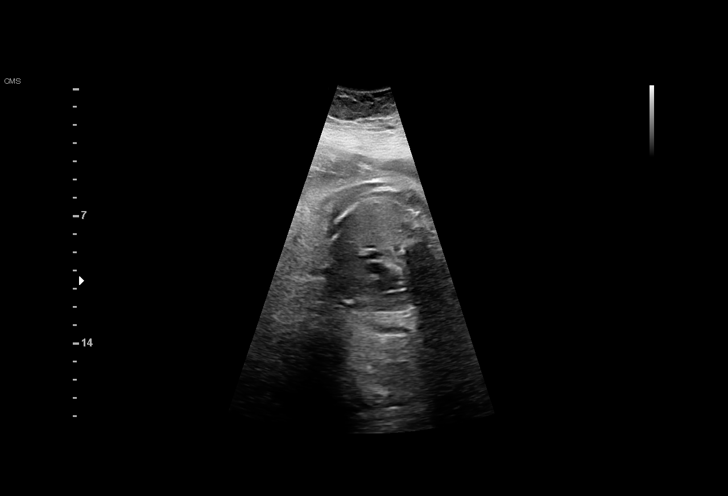
[im 35/47]
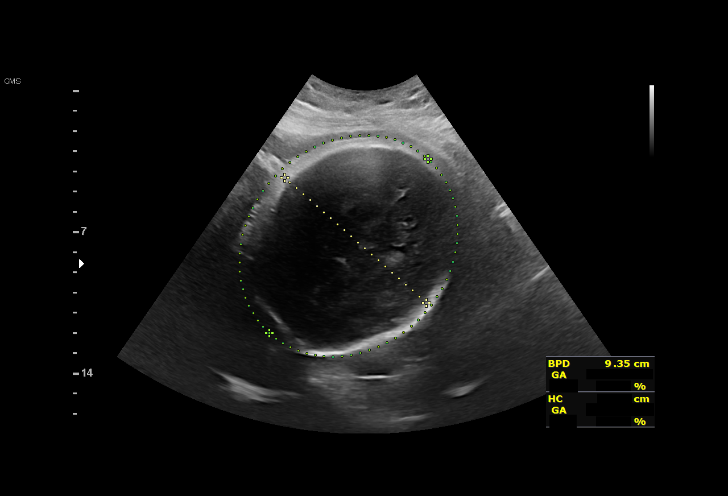
[im 38/47]
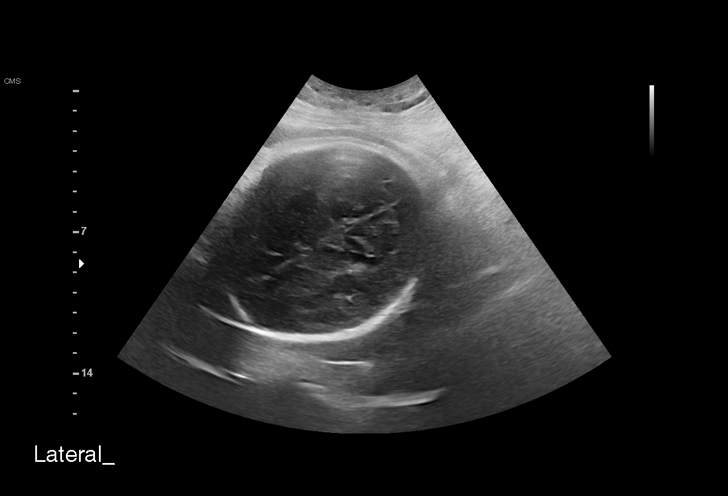
[im 41/47]
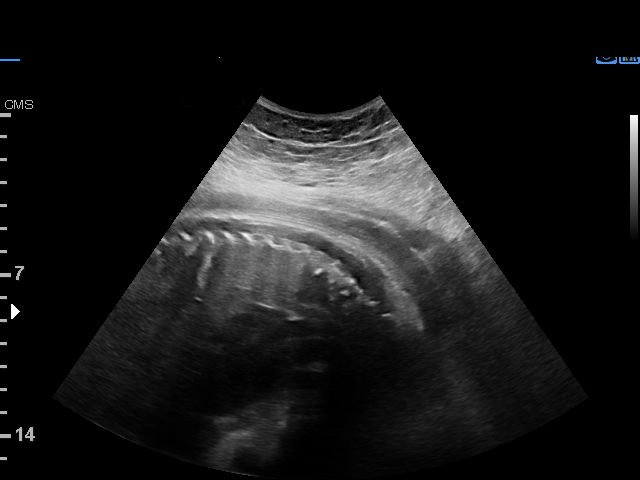
[im 45/47]
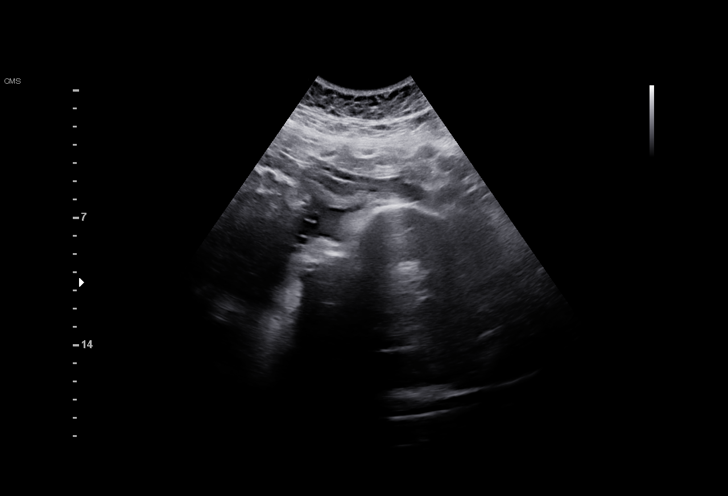

[13 of 28 positions shown; findings below may reference images not displayed]

ROHITH NP                              [HOSPITAL] at
 Ref. Address:     Faculty Practice

Indications

 35 weeks gestation of pregnancy
 Obesity complicating pregnancy, third
 trimester (BMI 47.8)
 Short interval between pregancies, 3rd
 trimester
 History of cesarean delivery, currently
 pregnant x 3
 Poor obstetric history: Previous
 preeclampsia / eclampsia/gestational HTN
 Low Risk NIPS(Negative Horizon); neg AFP
 Hypertension - Chronic/Pre-existing
 (labetalol)
Fetal Evaluation

 Num Of Fetuses:         1
 Fetal Heart Rate(bpm):  140
 Cardiac Activity:       Observed
 Presentation:           Cephalic
 Placenta:               Anterior
 P. Cord Insertion:      Previously Visualized
 Amniotic Fluid
 AFI FV:      Within normal limits

 AFI Sum(cm)     %Tile       Largest Pocket(cm)
 10.1            22

 RUQ(cm)                     LUQ(cm)        LLQ(cm)

Biophysical Evaluation

 Amniotic F.V:   Within normal limits       F. Tone:        Observed
 F. Movement:    Observed                   Score:          [DATE]
 F. Breathing:   Observed
Biometry

 BPD:      93.8  mm     G. Age:  38w 1d         97  %    CI:        76.45   %    70 - 86
                                                         FL/HC:      19.4   %    20.1 -
 HC:      339.9  mm     G. Age:  39w 1d         90  %    HC/AC:      0.95        0.93 -
 AC:       359   mm     G. Age:  39w 6d       > 99  %    FL/BPD:     70.3   %    71 - 87
 FL:       65.9  mm     G. Age:  34w 0d          7  %    FL/AC:      18.4   %    20 - 24
 HUM:      59.8  mm     G. Age:  34w 5d         42  %
 LV:        4.8  mm

 Est. FW:    5025  gm    7 lb 10 oz      97  %
OB History

 Gravidity:    4         Term:   2        Prem:   1        SAB:   0
 TOP:          0       Ectopic:  0        Living: 3
Gestational Age

 LMP:           35w 6d        Date:  04/27/21                 EDD:   02/01/22
 U/S Today:     37w 6d                                        EDD:   01/18/22
 Best:          35w 6d     Det. By:  LMP  (04/27/21)          EDD:   02/01/22
Anatomy

 Cranium:               Appears normal         LVOT:                   Appears normal
 Cavum:                 Previously seen        Aortic Arch:            Previously seen
 Ventricles:            Appears normal         Ductal Arch:            Previously seen
 Choroid Plexus:        Previously seen        Diaphragm:              Previously seen
 Cerebellum:            Previously seen        Stomach:                Appears normal, left
                                                                       sided
 Posterior Fossa:       Previously seen        Abdomen:                Previously seen
 Nuchal Fold:           Previously seen        Abdominal Wall:         Previously seen
 Face:                  Profile nl; orbits     Cord Vessels:           Previously seen
                        prev visualized
 Lips:                  Previously seen        Kidneys:                Appear normal
 Palate:                Previously seen        Bladder:                Appears normal
 Thoracic:              Appears normal         Spine:                  Previously seen
 Heart:                 Previously seen        Upper Extremities:      Previously seen
 RVOT:                  Previously seen        Lower Extremities:      Previously seen

 Other:  Male gender previously viisualized. Heels/feet, hands,nasal bone,
         3VV,  3VT, VC previously visualized. Technically difficult due to
         maternal habitus and fetal position.
Cervix Uterus Adnexa

 Cervix
 Not visualized (advanced GA >58wks)

 Uterus
 No abnormality visualized.

 Right Ovary
 Not visualized.

 Left Ovary
 Not visualized.

 Cul De Sac
 No free fluid seen.

 Adnexa
 No abnormality visualized.
Impression

 Chronic hypertension.  Patient takes labetalol for control.
 Blood pressure today at her office is 132/76 mmHg.  She
 does not have gestational diabetes.
 Obstetric history significant for 3 cesarean deliveries.

 On today's ultrasound, the estimated fetal weight is at the
 97th percentile and the abdominal circumference
 measurement is at the 99th percentile.  Amniotic fluid is
 normal and good fetal activity seen.  Antenatal testing is
 reassuring.  Cephalic presentation.

 Patient has a scheduled cesarean delivery at 38 weeks
 gestation.
Recommendations

 Continue weekly BPP till delivery.
                 Lenac, Elizabetta Darko

## 2023-06-05 ENCOUNTER — Emergency Department (HOSPITAL_COMMUNITY)
Admission: EM | Admit: 2023-06-05 | Discharge: 2023-06-05 | Disposition: A | Discharge status: Home / Self Care | Payer: Medicaid Other | Attending: Emergency Medicine | Admitting: Emergency Medicine

## 2023-06-05 ENCOUNTER — Other Ambulatory Visit: Payer: Self-pay

## 2023-06-05 ENCOUNTER — Encounter (HOSPITAL_COMMUNITY): Payer: Self-pay

## 2023-06-05 DIAGNOSIS — K0889 Other specified disorders of teeth and supporting structures: Secondary | ICD-10-CM | POA: Diagnosis present

## 2023-06-05 MED ORDER — PENICILLIN V POTASSIUM 500 MG PO TABS: 500.0000 mg | ORAL_TABLET | Freq: Four times a day (QID) | ORAL | 0 refills | Status: AC

## 2023-06-05 MED ORDER — OXYCODONE HCL 5 MG PO TABS
5.0000 mg | ORAL_TABLET | Freq: Once | ORAL | Status: AC
  Administered 2023-06-05: 5 mg via ORAL
  Filled 2023-06-05: qty 1

## 2023-06-05 MED ORDER — KETOROLAC TROMETHAMINE 15 MG/ML IJ SOLN
15.0000 mg | Freq: Once | INTRAMUSCULAR | Status: AC
  Administered 2023-06-05: 15 mg via INTRAMUSCULAR
  Filled 2023-06-05: qty 1

## 2023-06-05 MED ORDER — ACETAMINOPHEN 500 MG PO TABS
1000.0000 mg | ORAL_TABLET | Freq: Once | ORAL | Status: AC
  Administered 2023-06-05: 1000 mg via ORAL
  Filled 2023-06-05: qty 2

## 2023-06-05 NOTE — ED Provider Notes (Signed)
[place] EMERGENCY DEPARTMENT AT [facility] Provider Note   CSN: [id number] Arrival date & time: 06/05/23  2130     History  No chief complaint on file.   Leslie Ali is a 32 y.o. female.  32 yo F with a chief complaints of left upper dental pain.  This been going on for months but worsening over the past couple days.  She had been seen by her dentist about a week ago and is waiting to hear back from an oral surgeon.  Still proceeded to have a dental extraction done.  She came here in the middle the night because she feels like she needs something stronger for pain meds tried Tylenol and ibuprofen without significant improvement.        Home Medications Prior to Admission medications   Medication Sig Start Date End Date Taking? Authorizing Provider  penicillin v potassium (VEETID) 500 MG tablet Take 1 tablet (500 mg total) by mouth 4 (four) times daily for 7 days. 06/05/23 06/12/23 Yes Melene Plan, DO  escitalopram (LEXAPRO) 10 MG tablet Take 1 tablet (10 mg total) by mouth daily. 02/25/22   Hermina Staggers, MD      Allergies    Patient has no known allergies.    Review of Systems   Review of Systems  Physical Exam Updated Vital Signs BP (!) 168/80 (BP Location: Left Arm)   Pulse 69   Temp 98.8 F (37.1 C) (Oral)   Resp 18   SpO2 97%  Physical Exam Vitals and nursing note reviewed.  Constitutional:      General: She is not in acute distress.    Appearance: She is well-developed. She is not diaphoretic.  HENT:     Head: Normocephalic and atraumatic.     Mouth/Throat:      Comments: No obvious caries to the area of discomfort.  I was able to progress the tooth without significant discomfort.  No surrounding erythema or edema.  No fluctuance.  Tolerating secretions without difficulty.  No obvious extension into the face. Eyes:     Pupils: Pupils are equal, round, and reactive to light.  Cardiovascular:     Rate and Rhythm: Normal rate and regular  rhythm.     Heart sounds: No murmur heard.    No friction rub. No gallop.  Pulmonary:     Effort: Pulmonary effort is normal.     Breath sounds: No wheezing or rales.  Abdominal:     General: There is no distension.     Palpations: Abdomen is soft.     Tenderness: There is no abdominal tenderness.  Musculoskeletal:        General: No tenderness.     Cervical back: Normal range of motion and neck supple.  Skin:    General: Skin is warm and dry.  Neurological:     Mental Status: She is alert and oriented to person, place, and time.  Psychiatric:        Behavior: Behavior normal.     ED Results / Procedures / Treatments   Labs (all labs ordered are listed, but only abnormal results are displayed) Labs Reviewed - No data to display  EKG None  Radiology No results found.  Procedures Procedures    Medications Ordered in ED Medications  acetaminophen (TYLENOL) tablet 1,000 mg (has no administration in time range)  oxyCODONE (Oxy IR/ROXICODONE) immediate release tablet 5 mg (has no administration in time range)  ketorolac (TORADOL) 15 MG/ML injection 15  mg (has no administration in time range)    ED Course/ Medical Decision Making/ A&P                                 Medical Decision Making Risk OTC drugs. Prescription drug management.   32 yo F with a chief complaints of left upper dental pain.  Going on for months off and on but worsening over the past few days.  She did see her dentist about a week ago and had started her on Tylenol and ibuprofen.  She felt her pain was not well-controlled and so came here to be evaluated.  I discussed with her our department policy that we do not prescribe narcotics for dental pain.  She has no obvious signs of deep space infection on exam.  Will start her on antibiotics.  Encouraged her to call her dentist this morning.  3:54 AM:  I have discussed the diagnosis/risks/treatment options with the patient.  Evaluation and diagnostic  testing in the emergency department does not suggest an emergent condition requiring admission or immediate intervention beyond what has been performed at this time.  They will follow up with PCP. We also discussed returning to the ED immediately if new or worsening sx occur. We discussed the sx which are most concerning (e.g., sudden worsening pain, fever, inability to tolerate by mouth) that necessitate immediate return. Medications administered to the patient during their visit and any new prescriptions provided to the patient are listed below.  Medications given during this visit Medications  acetaminophen (TYLENOL) tablet 1,000 mg (has no administration in time range)  oxyCODONE (Oxy IR/ROXICODONE) immediate release tablet 5 mg (has no administration in time range)  ketorolac (TORADOL) 15 MG/ML injection 15 mg (has no administration in time range)     The patient appears reasonably screen and/or stabilized for discharge and I doubt any other medical condition or other [facility] requiring further screening, evaluation, or treatment in the ED at this time prior to discharge.          Final Clinical Impression(s) / ED Diagnoses Final diagnoses:  Pain, dental    Rx / DC Orders ED Discharge Orders          Ordered    penicillin v potassium (VEETID) 500 MG tablet  4 times daily        06/05/23 0335              Melene Plan, DO 06/05/23 0354

## 2023-06-05 NOTE — ED Triage Notes (Signed)
Pt arrives POV with complaints of left sided dental pain x 1 week. Pt was seen at a dentist a week ago for a cleaning and they told her the tooth needed pulled. Pt states she hasn't been able to sleep due to the pain and plans to call the dentist in the am. Motrin an hour prior to arrival. AOx4, ambulatory, resp EU

## 2023-06-05 NOTE — Discharge Instructions (Signed)
Please call your dentist today.  I placed the max dosing for the over-the-counter medications below.  Take 4 over the counter ibuprofen tablets 3 times a day or 2 over-the-counter naproxen tablets twice a day for pain. Also take tylenol 1000mg (2 extra strength) four times a day.
